# Patient Record
Sex: Male | Born: 1960
Health system: Southern US, Community
[De-identification: ages and names within clinical notes are randomized; demographics above are authoritative.]

## PROBLEM LIST (undated history)

## (undated) DIAGNOSIS — G8929 Other chronic pain: Secondary | ICD-10-CM

## (undated) DIAGNOSIS — I639 Cerebral infarction, unspecified: Secondary | ICD-10-CM

## (undated) DIAGNOSIS — M549 Dorsalgia, unspecified: Secondary | ICD-10-CM

## (undated) DIAGNOSIS — I10 Essential (primary) hypertension: Secondary | ICD-10-CM

## (undated) DIAGNOSIS — C801 Malignant (primary) neoplasm, unspecified: Secondary | ICD-10-CM

## (undated) HISTORY — PX: BACK SURGERY: SHX140

## (undated) HISTORY — PX: KNEE SURGERY: SHX244

---

## 2010-12-28 ENCOUNTER — Emergency Department (HOSPITAL_BASED_OUTPATIENT_CLINIC_OR_DEPARTMENT_OTHER)
Admission: EM | Admit: 2010-12-28 | Discharge: 2010-12-28 | Payer: Self-pay | Source: Home / Self Care | Admitting: Emergency Medicine

## 2011-04-21 ENCOUNTER — Emergency Department (INDEPENDENT_AMBULATORY_CARE_PROVIDER_SITE_OTHER): Payer: PRIVATE HEALTH INSURANCE

## 2011-04-21 ENCOUNTER — Inpatient Hospital Stay (HOSPITAL_COMMUNITY)
Admission: AD | Admit: 2011-04-21 | Discharge: 2011-04-23 | DRG: 921 | Disposition: A | Discharge status: Home / Self Care | Payer: PRIVATE HEALTH INSURANCE | Source: Other Acute Inpatient Hospital | Attending: Internal Medicine | Admitting: Internal Medicine

## 2011-04-21 ENCOUNTER — Emergency Department (HOSPITAL_BASED_OUTPATIENT_CLINIC_OR_DEPARTMENT_OTHER)
Admission: EM | Admit: 2011-04-21 | Discharge: 2011-04-21 | Disposition: A | Discharge status: Other Acute Inpatient Hospital | Payer: PRIVATE HEALTH INSURANCE | Attending: Emergency Medicine | Admitting: Emergency Medicine

## 2011-04-21 DIAGNOSIS — M5137 Other intervertebral disc degeneration, lumbosacral region: Secondary | ICD-10-CM | POA: Insufficient documentation

## 2011-04-21 DIAGNOSIS — M542 Cervicalgia: Secondary | ICD-10-CM

## 2011-04-21 DIAGNOSIS — R079 Chest pain, unspecified: Secondary | ICD-10-CM

## 2011-04-21 DIAGNOSIS — M545 Low back pain, unspecified: Secondary | ICD-10-CM | POA: Diagnosis present

## 2011-04-21 DIAGNOSIS — T8182XA Emphysema (subcutaneous) resulting from a procedure, initial encounter: Principal | ICD-10-CM | POA: Diagnosis present

## 2011-04-21 DIAGNOSIS — G8918 Other acute postprocedural pain: Secondary | ICD-10-CM | POA: Insufficient documentation

## 2011-04-21 DIAGNOSIS — A4902 Methicillin resistant Staphylococcus aureus infection, unspecified site: Secondary | ICD-10-CM | POA: Diagnosis present

## 2011-04-21 DIAGNOSIS — G8929 Other chronic pain: Secondary | ICD-10-CM | POA: Insufficient documentation

## 2011-04-21 DIAGNOSIS — M51379 Other intervertebral disc degeneration, lumbosacral region without mention of lumbar back pain or lower extremity pain: Secondary | ICD-10-CM | POA: Insufficient documentation

## 2011-04-21 DIAGNOSIS — I10 Essential (primary) hypertension: Secondary | ICD-10-CM | POA: Diagnosis present

## 2011-04-21 DIAGNOSIS — Z9889 Other specified postprocedural states: Secondary | ICD-10-CM

## 2011-04-21 DIAGNOSIS — F17200 Nicotine dependence, unspecified, uncomplicated: Secondary | ICD-10-CM | POA: Diagnosis present

## 2011-04-21 DIAGNOSIS — Y831 Surgical operation with implant of artificial internal device as the cause of abnormal reaction of the patient, or of later complication, without mention of misadventure at the time of the procedure: Secondary | ICD-10-CM | POA: Diagnosis present

## 2011-04-21 LAB — DIFFERENTIAL
Basophils Absolute: 0 10*3/uL (ref 0.0–0.1)
Basophils Relative: 0 % (ref 0–1)
Eosinophils Absolute: 0.1 10*3/uL (ref 0.0–0.7)
Eosinophils Relative: 2 % (ref 0–5)
Lymphs Abs: 2.1 10*3/uL (ref 0.7–4.0)

## 2011-04-21 LAB — BASIC METABOLIC PANEL
BUN: 10 mg/dL (ref 6–23)
GFR calc non Af Amer: >60 mL/min (ref >60)
Potassium: 3.7 mEq/L (ref 3.5–5.1)

## 2011-04-21 LAB — BLOOD GAS, ARTERIAL
Acid-Base Excess: 0.7 mmol/L (ref 0.0–2.0)
Drawn by: 29925
O2 Content: 2 L/min
O2 Saturation: 95.9 %
pCO2 arterial: 45.7 mmHg — ABNORMAL HIGH (ref 35.0–45.0)

## 2011-04-21 LAB — CBC
MCV: 87.4 fL (ref 78.0–100.0)
Platelets: 196 10*3/uL (ref 150–400)
RDW: 13.9 % (ref 11.5–15.5)
WBC: 6.7 10*3/uL (ref 4.0–10.5)

## 2011-04-21 MED ORDER — IOHEXOL 350 MG/ML SOLN
100.0000 mL | Freq: Once | INTRAVENOUS | Status: AC | PRN
  Administered 2011-04-21: 100 mL via INTRAVENOUS

## 2011-04-22 ENCOUNTER — Inpatient Hospital Stay (HOSPITAL_COMMUNITY): Payer: PRIVATE HEALTH INSURANCE

## 2011-04-22 DIAGNOSIS — J982 Interstitial emphysema: Secondary | ICD-10-CM

## 2011-04-22 DIAGNOSIS — R0789 Other chest pain: Secondary | ICD-10-CM

## 2011-04-22 LAB — MAGNESIUM: Magnesium: 1.9 mg/dL (ref 1.5–2.5)

## 2011-04-22 LAB — COMPREHENSIVE METABOLIC PANEL
CO2: 27 mEq/L (ref 19–32)
Calcium: 8.8 mg/dL (ref 8.4–10.5)
Creatinine, Ser: 0.88 mg/dL (ref 0.4–1.5)
GFR calc non Af Amer: >60 mL/min (ref >60)
Glucose, Bld: 95 mg/dL (ref 70–99)

## 2011-04-22 LAB — CBC
HCT: 42.2 % (ref 39.0–52.0)
MCH: 30.9 pg (ref 26.0–34.0)
MCHC: 34.1 g/dL (ref 30.0–36.0)
MCV: 90.6 fL (ref 78.0–100.0)
RDW: 14 % (ref 11.5–15.5)

## 2011-04-22 LAB — GLUCOSE, CAPILLARY: Glucose-Capillary: 108 mg/dL — ABNORMAL HIGH (ref 70–99)

## 2011-04-22 LAB — MRSA PCR SCREENING: MRSA by PCR: POSITIVE — AB

## 2011-04-22 NOTE — H&P (Signed)
NAME:  Ronnie Moore, Ronnie Moore NO.:  [id number]  MEDICAL RECORD NO.:  [id number]           PATIENT TYPE:  I  LOCATION:  3111                         FACILITY:  [facility]  PHYSICIAN:  Eduard Clos, MDDATE OF BIRTH:  20-Dec-1960  DATE OF ADMISSION:  04/21/2011 DATE OF DISCHARGE:                             HISTORY & PHYSICAL   PRIMARY CARE PHYSICIAN:  Jackie Plum, MD.  PRIMARY PAIN CLINIC:  [facility] in [place].  CHIEF COMPLAINT:  Neck pain.  HISTORY OF PRESENT ILLNESS:  A 50 year old male with known history of chronic low back pain on multiple pain relief medication and history of hypertension who has been having chronic low back pain for which he has had at least 3 procedures and had a procedure done today morning at the [facility] in [place].  Neurostimulator placed, after which the patient started developing pain worsening and increasing, and the pain eventually went to his neck wherein he was referred to the ER.  The patient had gone to the ER at [facility].  In the ER there, the patient had a CT angio chest which showed air in the mediastinum and epidural space in some of the veins in the region.  CT chest confirmed the pneumomediastinum along with the extrapleural air within the epidural space extending into the lower neck and soft tissues of the lower neck.  The patient has been moved to [facility] for further management.  Dr. Yetta Barre, neurosurgeon on-call, was contacted by ER physician.  The patient at this time still has pain in his neck and does also have some abdominal muscle contraction in the left side of his neck.  Denies any nausea, vomiting, abdominal pain, dysuria, discharge, diarrhea.  He had some subjective feeling of fever or chills.  He did not lose consciousness.  He has no headache.  He has no visual symptom.  He has some difficulty talking but no difficulty swallowing.  He has no focal deficit.  Denies any chest  pain or shortness of breath.  The patient at this time is being transferred to ICU for further close management.  PAST MEDICAL HISTORY:  Hypertension, chronic low back pain.  PAST SURGICAL HISTORY:  He has had three surgeries for his low back and today he had a nerve stimulator placed.  Medications prior to admission which needs to be confirmed includes: 1. OxyContin 20 mg twice daily. 2. Hydralazine. 3. Zanaflex 4 mg 3 times a daily. 4. Meloxicam 15 mg daily. 5. 325 four times daily. 6. Valium 10 mg at bedtime.  ALLERGIES:  No known drug allergies.  FAMILY HISTORY:  Significant for coronary disease in his dad with an MI.  SOCIAL HISTORY:  The patient smokes cigarettes and drinks alcohol.  He denies any drug abuse.  REVIEW OF SYSTEMS:  As per history of present illness, nothing else significant.  PHYSICAL EXAMINATION:  GENERAL:  The patient examined at bedside, still has some neck pain.  Otherwise, not in acute distress. VITAL SIGNS:  Blood pressure is 146/88, pulse 90 per minute, temperature 97.3, respirations 18, O2 sat 95%. HEENT:  There is  any muscle twitching at this time in the neck area, but the nurses have seen a few minutes ago.  There is no neck rigidity. There is mildly swollen neck.  The patient is able to protrude his tongue.  There is no obvious facial asymmetry.  Tongue is midline.  No discharge from ears, eyes, nose, or mouth. CHEST:  Bilateral air entry present.  No rhonchi.  No crepitation. HEART:  S1 and S2 heard. ABDOMEN:  Soft, nontender.  Bowel sounds heard. CNS:  Alert, awake, oriented to time, place, and person.  He is able to move lower extremities. EXTREMITIES:  Peripheral pulses felt.  No edema.  No acute ischemic changes, cyanosis, or clubbing.  There is a neurostimulator in the back, had some  mild ooze which has stopped.  LABORATORY DATA:  CT chest without contrast shows no pneumothorax, air within the epidural space throughout the third  degree and extending into the lower neck.  Extrapleural air within the posterior mediastinum extending into the soft tissues of the lower left neck.  These findings presently relate to the recent neurostimulator placement.  CT angio of neck shows no acute vascular pathology, mild atherosclerotic disease of the carotid bifurcation region, and proximal internal carotid arteries but no flow-limiting stenosis.  Air in the mediastinum, epidural space, and some of the veins of the region, presumably this is related to the neurostimulator of the spine that was implanted today.  C-spine complete shows mild degenerative changes but normal alignment and no acute bony findings.  Radiograph does not showing abnormal gas collection demonstrated on CT.  This examination does rule out significant pneumothorax and pneumomediastinum, mild basilar atelectasis.  X-ray of the lumbosacral spine shows uncomplicated appearance of the neurostimulator, degenerative disease in the lower lumbar spine.  CBC, WBC is 6.7, hemoglobin 15.6, hematocrit is 44.2, platelets 196.  Basic metabolic panel, sodium 136, potassium 2.7, chloride 101, carbon dioxide 24, glucose 99, BUN 10, creatinine 0.9, calcium 9.5.  ABG shows a pH of 7.36, pCO2 45.7, pO2 83.8, oxygen saturation is 95.9%.  ASSESSMENT: 1. Pneumomediastinum with possible developing subcutaneous emphysema. 2. Air in the epidural space and in the veins of the neck, presumably     this relates to the neurostimulator of the spine that was implanted     today. 3. Chronic low back pain. 4. History of hypertension. 5. Ongoing tobacco abuse.  PLAN: 1. At this time, we are going to admit the patient to intensive care     unit. 2. At this time for his pneumomediastinum and possible subcutaneous     emphysema of the left side of the neck and air within the epidural     space, we are going to keep the patient n.p.o.  I am going to place     the patient on vancomycin and  Primaxin.  I am going to discuss with     neurosurgeon on-call and CT surgeon on-call for further     recommendation and I already discussed with pulmonary critical     care. 3. We will repeat chest x-ray in a.m. along with chest x-ray of neck. 4. For his pain relief, we will place the patient on Dilaudid 1-2 mg     IV hourly p.r.n. 5. We will place the patient on tetanus toxoid  intramuscular     injection as the patient does not recall the last dose of his     tetanus. 6. Further recommendation based on the clinic course and consult  recommendations.     Eduard Clos, MD     ANK/MEDQ  D:  04/21/2011  T:  04/21/2011  Job:  [id number]  cc:   Jackie Plum, M.D. [facility].  Electronically Signed by Midge Minium MD on 04/22/2011 07:20:56 AM

## 2011-04-23 ENCOUNTER — Inpatient Hospital Stay (HOSPITAL_COMMUNITY): Payer: PRIVATE HEALTH INSURANCE

## 2011-04-23 LAB — BASIC METABOLIC PANEL
Chloride: 105 mEq/L (ref 96–112)
GFR calc Af Amer: >60 mL/min (ref >60)
GFR calc non Af Amer: >60 mL/min (ref >60)
Potassium: 3.9 mEq/L (ref 3.5–5.1)
Sodium: 137 mEq/L (ref 135–145)

## 2011-04-23 LAB — CBC
MCV: 91.1 fL (ref 78.0–100.0)
Platelets: 183 10*3/uL (ref 150–400)
RBC: 4.49 MIL/uL (ref 4.22–5.81)
RDW: 13.8 % (ref 11.5–15.5)
WBC: 6.7 10*3/uL (ref 4.0–10.5)

## 2011-04-27 ENCOUNTER — Emergency Department (HOSPITAL_BASED_OUTPATIENT_CLINIC_OR_DEPARTMENT_OTHER)
Admission: EM | Admit: 2011-04-27 | Discharge: 2011-04-27 | Disposition: A | Discharge status: Home / Self Care | Payer: PRIVATE HEALTH INSURANCE | Attending: Emergency Medicine | Admitting: Emergency Medicine

## 2011-04-27 DIAGNOSIS — F17200 Nicotine dependence, unspecified, uncomplicated: Secondary | ICD-10-CM | POA: Insufficient documentation

## 2011-04-27 DIAGNOSIS — M542 Cervicalgia: Secondary | ICD-10-CM | POA: Insufficient documentation

## 2011-04-27 DIAGNOSIS — I10 Essential (primary) hypertension: Secondary | ICD-10-CM | POA: Insufficient documentation

## 2011-04-27 DIAGNOSIS — G8929 Other chronic pain: Secondary | ICD-10-CM | POA: Insufficient documentation

## 2011-05-05 ENCOUNTER — Emergency Department (HOSPITAL_BASED_OUTPATIENT_CLINIC_OR_DEPARTMENT_OTHER)
Admission: EM | Admit: 2011-05-05 | Discharge: 2011-05-05 | Disposition: A | Discharge status: Home / Self Care | Payer: PRIVATE HEALTH INSURANCE | Attending: Emergency Medicine | Admitting: Emergency Medicine

## 2011-05-05 DIAGNOSIS — M549 Dorsalgia, unspecified: Secondary | ICD-10-CM | POA: Insufficient documentation

## 2011-05-05 DIAGNOSIS — F17200 Nicotine dependence, unspecified, uncomplicated: Secondary | ICD-10-CM | POA: Insufficient documentation

## 2011-05-05 DIAGNOSIS — F3289 Other specified depressive episodes: Secondary | ICD-10-CM | POA: Insufficient documentation

## 2011-05-05 DIAGNOSIS — Z79899 Other long term (current) drug therapy: Secondary | ICD-10-CM | POA: Insufficient documentation

## 2011-05-05 DIAGNOSIS — I10 Essential (primary) hypertension: Secondary | ICD-10-CM | POA: Insufficient documentation

## 2011-05-05 DIAGNOSIS — G8929 Other chronic pain: Secondary | ICD-10-CM | POA: Insufficient documentation

## 2011-05-05 DIAGNOSIS — F329 Major depressive disorder, single episode, unspecified: Secondary | ICD-10-CM | POA: Insufficient documentation

## 2011-05-18 NOTE — Discharge Summary (Signed)
NAME:  Ronnie Moore, Ronnie Moore NO.:  [id number]  MEDICAL RECORD NO.:  [id number]           PATIENT TYPE:  I  LOCATION:  3111                         FACILITY:  [facility]  PHYSICIAN:  Lonia Blood, M.D.DATE OF BIRTH:  10/08/1961  DATE OF ADMISSION:  04/21/2011 DATE OF DISCHARGE:  04/23/2011                        DISCHARGE SUMMARY - REFERRING   ADMITTING PHYSICIAN:  Eduard Clos, MD, [facility].  DISCHARGING PHYSICIAN:  Lonia Blood, MD  PRIMARY CARE PHYSICIAN:  Jackie Plum, MD  PAIN CLINIC:  [facility] in [place], telephone number [phone].  CONSULTANTS THIS ADMISSION: 1. Tia Alert, MD, with Neurosurgery. 2. Nelda Bucks, MD, with Pulmonary Critical Care Medicine.  CHIEF COMPLAINT/REASON FOR ADMISSION:  [name] is a 50 year old male who has been having issues with chronic neck and back pain for many many years.  He has had multiple procedures done and on the date of admission had had a neurostimulator placed via the [facility] which is a pain clinic in [place].  Post stimulator placement, the patient developed worsening and increasing pain where he was eventually referred to [facility] ER for further evaluation.  He had gone to the [facility] ER. CT angio of the chest done in that facility demonstrated a pneumomediastinum as well as air in the epidural space.  His CT of the chest confirmed pneumomediastinum including the additional finding of extrapleural air within the epidural space extending into the lower back and soft tissues of the lower neck.  Some of this air was presumed to be related to expected postop findings from neurostimulator placement, but the pneumomediastinum was an unexpected finding; therefore, the patient was transferred to [facility] for further evaluation.  Upon arrival to [facility], the patient endorses continuing having neck pain but was not having any acute respiratory distress.   His BP was 146/88, pulse 90, respirations 18, O2 sat 95%.  He was afebrile.  The patient had no pulmonary or neurological deficits on initial exam.  He was found to have some musculoskeletal spasmodic type activity without rigidity and the neck was mildly swollen as expected at the operative site. Because he had a recent neurostimulator placed, neurosurgeon, Dr. Yetta Barre, was consulted.  Because of the pneumomediastinum, Dr. Tyson Alias with Pulmonary Critical Care Medicine was consulted.  His initial laboratory values showed a white count of 6700, hemoglobin 15.6, hematocrit of 44.2, platelets 196,000.  Sodium 136, potassium 2.7, chloride 101, glucose 99, BUN 10, creatinine 0.9.  ABG, pH 7.36, pCO2 of 45.7, pO2 of 83.8, O2 sat 95.9%.  CT angiography of the neck shows no acute vascular pathology but air in the mediastinum, epidural space, and some of the veins in at the region and this was again presumably related to the neurostimulator implantation from same date. 1. Hypertension. 2. Chronic low back pain, followed by the [facility].  ADMITTING DIAGNOSES: 1. Pneumomediastinum without hypoxemia or respiratory distress. 2. Postoperative air in the epidural space and veins of the neck,     likely expected finding after neurostimulator implant. 3. Chronic low back pain as described. 4. Hypertension, moderate controlled. 5. Ongoing  tobacco abuse.  DIAGNOSTICS: 1. Complete four-view x-ray of the cervical spine on Apr 21, 2011,     shows mild degenerative changes but normal alignment and no acute     bony findings. 2. CT angio of the neck on Apr 21, 2011, that again shows no acute     vascular pathology and air in the mediastinum and epidural space as     previously described. 3. Two-view chest x-ray on Apr 21, 2011, shows no abnormal gas     collection as was demonstrated on the CT.  This examination does     rule out significant pneumothorax and pneumomediastinum.  There was      found to be mild basilar atelectasis. 4. Lumbar spine, two - three-view on Apr 21, 2011, shows uncomplicated     appearance of neurostimulator.  Degenerative disease in lower     lumbar spine. 5. CT of the chest without contrast on Apr 21, 2011, this shows no     pneumothorax and air within the epidural space throughout the     thoracic region extending into the lower neck.  Extrapleural air     within the posterior mediastinum extending into the soft tissues of     the lower neck.  These findings are presumedly related to the     recent neurostimulator placement. 6. Two-view chest x-ray on Apr 22, 2011, shows low lung volumes, no     acute findings. 7. Plain x-ray of the soft tissues of the neck on Apr 22, 2011, shows     supraglottic airway appears to be intact.  Minimal spondylosis at     C5-C6. 8. Portable chest x-ray on Apr 22, 2011, shows no acute findings.  No     evidence of pneumomediastinum and pneumothorax. 9. Portable chest x-ray on Apr 23, 2011, shows that the diagnostic     quality is decreased by body habitus and the patient's motion.  No     definite pneumothorax.  Developing right basilar airspace disease.  LABORATORY DATA:  MRSA PCR screening was positive.  Followup labs on Apr 23, 2011, date of discharge, white count 6700, hemoglobin 14, hematocrit 41, platelets 183,000.  Sodium 137, potassium 3.9, chloride 105, CO2 is 25, glucose 111, BUN 14, creatinine 1.03.  TSH was checked this admission 1.345, magnesium was 1.9.  HOSPITAL COURSE: 1. Pneumomediastinum post neurostimulator implant.  The patient had     two-fold problem in regard to recurring pain at the neck and     asymptomatic pneumomediastinum at presentation.  Neurosurgery, Dr.     Marikay Alar evaluated the patient.  Dr. Yetta Barre felt that the neck     pain he was experiencing was possibly related to the epidural air.     There were no indications to proceed with any surgical     intervention.  He felt that  the air will simply dissipate in time.     He endorses that the patient's neurological exam is at baseline and     he recommends to simply treat him expectantly with pain management     and hopefully, his pain will ease or dissipate.  Because of the     pneumomediastinum, Dr. Tyson Alias with Pulmonary Critical Care     Medicine was consulted.  Recommendations were to follow clinically     and to follow with serial chest x-rays.  On date of discharge,     there was no progression of air in the mediastinum or chest.  The     patient was not in any distress.  He was maintaining appropriate O2     saturations of 93% on room air and was deemed appropriate for     discharge home.  Pulmonary Critical Care Medicine actually signed     off. 2. Chronic pain.  The patient has had difficulty with pain management     for many years.  He has followed regularly at the [facility]     in [place].  When questioning the patient about his pain     medications, he endorsed an unusual schedule for his pain     medications including taking 20 mg OxyContin q.6 h. as well as     taking Percocet 6-8 tablets per day.  The [facility] was     contacted and a copy of the pain management contract for their     facility was faxed as well as the patient's medications that are     prescribed solely by the pain clinic.  It is noted that the     patient's OxyContin is 20 mg every 12 hours with 60 tablets     dispensed at the last prescription date, Apr 08, 2011.  His Percocet     is also at 10/25 strength to be taken every 8 hours with 90 tablets     dispensed on Apr 08, 2011.  At the present time, I discussed with     Dr. Sharon Seller any recommendations for pain management and there was     some discussion with the patient this morning that he may actually     be out of some of his pain medications.  Therefore, we will give     him five days' worth of all of his pain medication prescriptions.     The patient does  have an appointment scheduled with the [facility] on Apr 27, 2011.  In addition, the patient and     his wife were requesting a physician note to have the patient be     excused from a court date on Monday Apr 26, 2011.  We deferred this     to the patient's pain clinic/surgeon or physician who placed the     neurostimulator device to determine if there were any postoperative     indications for him to not attend any court date scheduled.  I did     discuss this with the Pain Clinic and they see no indications at     this time for the patient to be excused from any pending court     dates or other upcoming appointment.  FINAL DISCHARGE DIAGNOSES: 1. Postop pneumomediastinum after neurostimulator placement, resolved. 2. Chronic neck pain with acute component, related to expected postop     changes from neurostimulator implant. 3. Hypertension, currently controlled.  DISCHARGE MEDICATIONS: 1. Hydralazine 10 mg four times daily. 2. Meloxicam 15 mg one tablet by mouth daily as needed for pain. 3. OxyContin 20 mg b.i.d. as needed for pain. 4. Percocet 10/325 one tablet every 8 hours as needed for pain. 5. Valium 10 mg one tablet daily at bedtime. 6. Zanaflex 4 mg three times daily as needed for pain.  OTHER DISCHARGE INSTRUCTIONS:  Activity:  Increase activity slowly, otherwise, as recommended by previous physician, i.e., MD that placed neurostimulator device. Diet:  No restrictions. Followup Appointments: 1. Return to the [facility], telephone number [phone], on Apr 27, 2011, as scheduled. 2. Follow up with any other physicians that placed the nerve     stimulator device and follow up with Dr. Julio Sicks as previously     scheduled.     Allison L. Rennis Harding, N.P.   ______________________________ Lonia Blood, M.D.    ALE/MEDQ  D:  04/23/2011  T:  04/23/2011  Job:  [id number]  cc:   [facility] Jackie Plum, M.D.  Electronically Signed  by Junious Silk N.P. on 04/28/2011 08:15:44 AM Electronically Signed by Jetty Duhamel M.D. on 05/17/2011 09:50:49 PM

## 2011-05-18 NOTE — Consult Note (Signed)
  NAME:  Ronnie Moore, Ronnie Moore NO.:  [id number]  MEDICAL RECORD NO.:  [id number]           PATIENT TYPE:  I  LOCATION:  3111                         FACILITY:  [facility]  PHYSICIAN:  Tia Alert, MD     DATE OF BIRTH:  Nov 15, 1961  DATE OF CONSULTATION:  04/22/2011 DATE OF DISCHARGE:                                CONSULTATION   CHIEF COMPLAINT:  Neck pain.  HISTORY OF PRESENT ILLNESS:  Mr. Bautch is a 50 year old gentleman who is status post lumbar laminectomy by Dr. Corinne Ports in [facility] who has been in pain management with Dr. Dalene Carrow.  He was getting a thoracic spinal cord stimulator placement yesterday and developed left-sided neck pain.  The pain was severe.  During the procedure, he was sent to [facility] ER where CT scan showed epidural air in the cervical and thoracic region but no complication around the spinal cord stimulator.  The patient was admitted to the ICU and the hospitalist asked me to see him in consultation.  The patient denies any new arm pain or numbness, tingling, or weakness in the arms or legs.  He continues to complain of left-sided neck pain.  He is receiving significant amounts of pain medication.  PHYSICAL EXAMINATION:  He is awake and alert.  He is interactive.  No aphasia.  Good attention span.  He moves all extremities with good power and strength.  He has hypoactive reflexes.  Negative Hoffman sign.  Gait is not tested at this point.  ASSESSMENT AND PLAN:  This is a 50 year old gentleman with epidural air after a thoracic spinal cord stimulator placement and some air was injected into the epidural space.  At this time, this procedure is done in order to localize the epidural space with placement of the spinal cord stimulator and therefore I am not sure that the patient still have epidural air after this procedure each and every time but we easily get these CT scans after this, so it is difficult to know.  He does  have neck pain and it maybe related to the epidural air.  There is no surgery to be done and that will simply dissipate in time.  His neurological exam is at its baseline.  I would simply treat him expectantly with pain management and hopefully pain will ease or dissipates.     Tia Alert, MD     DSJ/MEDQ  D:  04/22/2011  T:  04/23/2011  Job:  [id number]  Electronically Signed by Marikay Alar MD on 05/18/2011 09:46:08 AM

## 2012-08-30 ENCOUNTER — Emergency Department (HOSPITAL_BASED_OUTPATIENT_CLINIC_OR_DEPARTMENT_OTHER)
Admission: EM | Admit: 2012-08-30 | Discharge: 2012-08-30 | Disposition: A | Discharge status: Home / Self Care | Payer: PRIVATE HEALTH INSURANCE | Attending: Emergency Medicine | Admitting: Emergency Medicine

## 2012-08-30 ENCOUNTER — Encounter (HOSPITAL_BASED_OUTPATIENT_CLINIC_OR_DEPARTMENT_OTHER): Payer: Self-pay | Admitting: Emergency Medicine

## 2012-08-30 DIAGNOSIS — M545 Low back pain, unspecified: Secondary | ICD-10-CM | POA: Insufficient documentation

## 2012-08-30 DIAGNOSIS — G8929 Other chronic pain: Secondary | ICD-10-CM

## 2012-08-30 DIAGNOSIS — Y92009 Unspecified place in unspecified non-institutional (private) residence as the place of occurrence of the external cause: Secondary | ICD-10-CM | POA: Insufficient documentation

## 2012-08-30 DIAGNOSIS — X500XXA Overexertion from strenuous movement or load, initial encounter: Secondary | ICD-10-CM | POA: Insufficient documentation

## 2012-08-30 DIAGNOSIS — W19XXXA Unspecified fall, initial encounter: Secondary | ICD-10-CM

## 2012-08-30 HISTORY — DX: Other chronic pain: G89.29

## 2012-08-30 HISTORY — DX: Dorsalgia, unspecified: M54.9

## 2012-08-30 MED ORDER — LORAZEPAM 2 MG PO TABS: 2.0000 mg | ORAL_TABLET | Freq: Three times a day (TID) | ORAL | Status: DC

## 2012-08-30 MED ORDER — MORPHINE SULFATE ER BEADS 30 MG PO CP24: 30.0000 mg | ORAL_CAPSULE | Freq: Every day | ORAL | Status: DC

## 2012-08-30 MED ORDER — HYDROMORPHONE HCL PF 2 MG/ML IJ SOLN
2.0000 mg | Freq: Once | INTRAMUSCULAR | Status: AC
  Administered 2012-08-30: 2 mg via INTRAMUSCULAR
  Filled 2012-08-30: qty 1

## 2012-08-30 MED ORDER — MELOXICAM 15 MG PO TABS: 15.0000 mg | ORAL_TABLET | Freq: Every day | ORAL | Status: AC

## 2012-08-30 MED ORDER — OXYCODONE HCL 15 MG PO TABS: 15.0000 mg | ORAL_TABLET | ORAL | Status: DC | PRN

## 2012-08-30 NOTE — ED Provider Notes (Signed)
History     CSN: [id number]  Arrival date & time 08/30/12  4098   First MD Initiated Contact with Patient 08/30/12 [id number]      Chief Complaint  Patient presents with  . Fall    HPI  Pt states he fell while getting out of the shower. Denies head injury or LOC. Pt states he has pain lower back pain, radiating down legs and buttocks. Pt denies elimination problems.     Patient states he did not actually hit the ground.  He just twisted and caught himself on the way down.  He has a history of chronic pain and just saw he exacerbated his pain.  He has no focal neurological complaints.  Past Medical History  Diagnosis Date  . Chronic back pain     Past Surgical History  Procedure Date  . Back surgery     No family history on file.  History  Substance Use Topics  . Smoking status: Not on file  . Smokeless tobacco: Not on file  . Alcohol Use:       Review of Systems  All other systems reviewed and are negative.    Allergies  Review of patient's allergies indicates no known allergies.  Home Medications   Current Outpatient Rx  Name Route Sig Dispense Refill  . LORAZEPAM 2 MG PO TABS Oral Take 1 tablet (2 mg total) by mouth 3 (three) times daily. 12 tablet 0  . MELOXICAM 15 MG PO TABS Oral Take 1 tablet (15 mg total) by mouth daily. 6 tablet 0  . MORPHINE SULFATE ER BEADS 30 MG PO CP24 Oral Take 1 capsule (30 mg total) by mouth daily. 6 capsule 0  . OXYCODONE HCL 15 MG PO TABS Oral Take 1 tablet (15 mg total) by mouth every 4 (four) hours as needed. 15 tablet 0    BP 151/81  Pulse 93  Temp 98.2 F (36.8 C) (Oral)  Resp 16  SpO2 97%  Physical Exam  Nursing note and vitals reviewed. Constitutional: He is oriented to person, place, and time. He appears well-developed. No distress.  HENT:  Head: Normocephalic and atraumatic.  Eyes: Pupils are equal, round, and reactive to light.  Neck: Normal range of motion.  Cardiovascular: Normal rate and intact distal pulses.    Pulmonary/Chest: No respiratory distress.  Abdominal: Normal appearance. He exhibits no distension.  Musculoskeletal:       Thoracic back: He exhibits tenderness, pain and spasm.       Lumbar back: He exhibits tenderness, pain and spasm.       Pain primarily is related to muscle spasm and not elicited with bony palpation.  Neurological: He is alert and oriented to person, place, and time. No cranial nerve deficit.  Skin: Skin is warm and dry. No rash noted.  Psychiatric: He has a normal mood and affect. His behavior is normal.    ED Course  Procedures (including critical care time) Scheduled Meds:   . HYDROmorphone  2 mg Intramuscular Once   Continuous Infusions:  PRN Meds:.  Labs Reviewed - No data to display No results found.   1. Fall   2. Chronic pain       MDM          Nelia Shi, MD 08/30/12 [phone]

## 2012-08-30 NOTE — ED Notes (Signed)
Pt states he fell while getting out of the shower.  Denies head injury or LOC.  Pt states he has pain lower back pain, radiating down legs and buttocks.  Pt denies elimination problems.

## 2012-12-26 ENCOUNTER — Emergency Department (HOSPITAL_BASED_OUTPATIENT_CLINIC_OR_DEPARTMENT_OTHER)
Admission: EM | Admit: 2012-12-26 | Discharge: 2012-12-26 | Disposition: A | Discharge status: Home / Self Care | Payer: PRIVATE HEALTH INSURANCE | Attending: Emergency Medicine | Admitting: Emergency Medicine

## 2012-12-26 ENCOUNTER — Encounter (HOSPITAL_BASED_OUTPATIENT_CLINIC_OR_DEPARTMENT_OTHER): Payer: Self-pay | Admitting: *Deleted

## 2012-12-26 DIAGNOSIS — Z79899 Other long term (current) drug therapy: Secondary | ICD-10-CM | POA: Insufficient documentation

## 2012-12-26 DIAGNOSIS — Y929 Unspecified place or not applicable: Secondary | ICD-10-CM | POA: Insufficient documentation

## 2012-12-26 DIAGNOSIS — IMO0002 Reserved for concepts with insufficient information to code with codable children: Secondary | ICD-10-CM | POA: Insufficient documentation

## 2012-12-26 DIAGNOSIS — F17200 Nicotine dependence, unspecified, uncomplicated: Secondary | ICD-10-CM | POA: Insufficient documentation

## 2012-12-26 DIAGNOSIS — Z8739 Personal history of other diseases of the musculoskeletal system and connective tissue: Secondary | ICD-10-CM | POA: Insufficient documentation

## 2012-12-26 DIAGNOSIS — Y939 Activity, unspecified: Secondary | ICD-10-CM | POA: Insufficient documentation

## 2012-12-26 DIAGNOSIS — T169XXA Foreign body in ear, unspecified ear, initial encounter: Secondary | ICD-10-CM | POA: Insufficient documentation

## 2012-12-26 MED ORDER — OXYCODONE-ACETAMINOPHEN 5-325 MG PO TABS
2.0000 | ORAL_TABLET | Freq: Once | ORAL | Status: AC
  Administered 2012-12-26: 2 via ORAL
  Filled 2012-12-26 (×2): qty 2

## 2012-12-26 NOTE — ED Notes (Signed)
MD at bedside. 

## 2012-12-26 NOTE — ED Notes (Signed)
Pt states that due to the contract he has with pain management clinic must have a shot for pain if he gets meds. Will notify MD

## 2012-12-26 NOTE — ED Notes (Signed)
Spoke with MD states that is ok for him to take medication here for the pain

## 2012-12-26 NOTE — ED Notes (Signed)
D/c home with ride- no new rx given 

## 2012-12-26 NOTE — ED Provider Notes (Signed)
History     CSN: [id number]  Arrival date & time 12/26/12  4098   First MD Initiated Contact with Patient 12/26/12 1013      Chief Complaint  Patient presents with  . Otalgia    (Consider location/radiation/quality/duration/timing/severity/associated sxs/prior treatment) HPI Comments: Patient presents with right ear pain after the part of the year but it got stuck in his ear last night. He's had constant worsening pain in his right ear since that incident. He denies any drainage from his ear. He denies any other complaints.  Patient is a 52 y.o. male presenting with ear pain.  Otalgia Pertinent negatives include no headaches, no rhinorrhea and no vomiting.    Past Medical History  Diagnosis Date  . Chronic back pain     Past Surgical History  Procedure Date  . Back surgery     History reviewed. No pertinent family history.  History  Substance Use Topics  . Smoking status: Current Some Day Smoker  . Smokeless tobacco: Not on file  . Alcohol Use: Yes     Comment: occ      Review of Systems  Constitutional: Negative for fever.  HENT: Positive for ear pain. Negative for congestion and rhinorrhea.   Gastrointestinal: Negative for nausea and vomiting.  Neurological: Negative for headaches.    Allergies  Review of patient's allergies indicates no known allergies.  Home Medications   Current Outpatient Rx  Name  Route  Sig  Dispense  Refill  . LORAZEPAM 2 MG PO TABS   Oral   Take 1 tablet (2 mg total) by mouth 3 (three) times daily.   12 tablet   0   . MELOXICAM 15 MG PO TABS   Oral   Take 1 tablet (15 mg total) by mouth daily.   6 tablet   0   . MORPHINE SULFATE ER BEADS 30 MG PO CP24   Oral   Take 1 capsule (30 mg total) by mouth daily.   6 capsule   0   . OXYCODONE HCL 15 MG PO TABS   Oral   Take 1 tablet (15 mg total) by mouth every 4 (four) hours as needed.   15 tablet   0     BP 176/93  Pulse 83  Temp 97.8 F (36.6 C) (Oral)  Resp  20  Ht 5\' 8"  (1.727 m)  Wt 300 lb (136.079 kg)  BMI 45.61 kg/m2  SpO2 97%  Physical Exam  Constitutional: He is oriented to person, place, and time. He appears well-developed and well-nourished.  HENT:       Patient has a black object in the external right ear canal  Eyes: Pupils are equal, round, and reactive to light.  Cardiovascular: Normal rate.   Pulmonary/Chest: Effort normal.  Neurological: He is alert and oriented to person, place, and time.  Skin: Skin is warm and dry.    ED Course  Procedures (including critical care time)  Labs Reviewed - No data to display No results found.   1. Ear foreign body       MDM  Using alligator forceps the ear but it was removed on one attempt without complications. The ear was reexamined and there is no erythema, bleeding or drainage from the ear. The TM appears intact. Head eyes and return if his symptoms worsen. I also advised him need a recheck on his blood pressure by his primary care physician        Rolan Bucco, MD 12/26/12 1103

## 2012-12-26 NOTE — ED Notes (Signed)
Ear bud in right ear

## 2013-08-18 ENCOUNTER — Encounter (HOSPITAL_BASED_OUTPATIENT_CLINIC_OR_DEPARTMENT_OTHER): Payer: Self-pay | Admitting: Emergency Medicine

## 2013-08-18 ENCOUNTER — Emergency Department (HOSPITAL_BASED_OUTPATIENT_CLINIC_OR_DEPARTMENT_OTHER)
Admission: EM | Admit: 2013-08-18 | Discharge: 2013-08-18 | Disposition: A | Discharge status: Home / Self Care | Payer: PRIVATE HEALTH INSURANCE | Attending: Emergency Medicine | Admitting: Emergency Medicine

## 2013-08-18 DIAGNOSIS — G8929 Other chronic pain: Secondary | ICD-10-CM | POA: Insufficient documentation

## 2013-08-18 DIAGNOSIS — Z79899 Other long term (current) drug therapy: Secondary | ICD-10-CM | POA: Insufficient documentation

## 2013-08-18 DIAGNOSIS — M549 Dorsalgia, unspecified: Secondary | ICD-10-CM | POA: Insufficient documentation

## 2013-08-18 DIAGNOSIS — F17200 Nicotine dependence, unspecified, uncomplicated: Secondary | ICD-10-CM | POA: Insufficient documentation

## 2013-08-18 MED ORDER — ONDANSETRON HCL 4 MG/2ML IJ SOLN
4.0000 mg | Freq: Once | INTRAMUSCULAR | Status: AC
  Administered 2013-08-18: 4 mg via INTRAMUSCULAR
  Filled 2013-08-18: qty 2

## 2013-08-18 MED ORDER — HYDROMORPHONE HCL PF 2 MG/ML IJ SOLN
2.0000 mg | Freq: Once | INTRAMUSCULAR | Status: AC
  Administered 2013-08-18: 2 mg via INTRAMUSCULAR
  Filled 2013-08-18: qty 1

## 2013-08-18 MED ORDER — KETOROLAC TROMETHAMINE 60 MG/2ML IM SOLN
60.0000 mg | Freq: Once | INTRAMUSCULAR | Status: AC
  Administered 2013-08-18: 60 mg via INTRAMUSCULAR
  Filled 2013-08-18: qty 2

## 2013-08-18 NOTE — ED Notes (Signed)
Pt reports fall three days ago.  Struck left side of his body.  Patient reports LOC x 10-15 minutes.  Reports today d/t continuing pain.  States is currently under a pain clinic management-unable to receive any written prescriptions for pain.

## 2013-08-18 NOTE — ED Provider Notes (Signed)
  Medical screening examination/treatment/procedure(s) were performed by non-physician practitioner and as supervising physician I was immediately available for consultation/collaboration.   Yesli Vanderhoff, MD 08/18/13 1538 

## 2013-08-18 NOTE — ED Provider Notes (Signed)
CSN: [id number]     Arrival date & time 08/18/13  1143 History   First MD Initiated Contact with Patient 08/18/13 1221     Chief Complaint  Patient presents with  . Back Pain   (Consider location/radiation/quality/duration/timing/severity/associated sxs/prior Treatment) Patient is a 52 y.o. male presenting with back pain. The history is provided by the patient. No language interpreter was used.  Back Pain Location:  Generalized Quality:  Aching Pain severity:  Severe Onset quality:  Gradual Progression:  Worsening Chronicity:  New Relieved by:  Nothing Worsened by:  Nothing tried Pt complains of back pain.   Pt is on oxycodone and morphine for pain management and in a pain management clinic.   Pain not managed today.   Pt requesting injection.   Pt reports he fell 3 days ago and hit left side.  Shoulder and side.    Past Medical History  Diagnosis Date  . Chronic back pain    Past Surgical History  Procedure Laterality Date  . Back surgery     No family history on file. History  Substance Use Topics  . Smoking status: Current Some Day Smoker  . Smokeless tobacco: Not on file  . Alcohol Use: Yes     Comment: occ    Review of Systems  Musculoskeletal: Positive for back pain.  All other systems reviewed and are negative.    Allergies  Review of patient's allergies indicates no known allergies.  Home Medications   Current Outpatient Rx  Name  Route  Sig  Dispense  Refill  . LORazepam (ATIVAN) 2 MG tablet   Oral   Take 1 tablet (2 mg total) by mouth 3 (three) times daily.   12 tablet   0   . meloxicam (MOBIC) 15 MG tablet   Oral   Take 1 tablet (15 mg total) by mouth daily.   6 tablet   0   . morphine (AVINZA) 30 MG 24 hr capsule   Oral   Take 1 capsule (30 mg total) by mouth daily.   6 capsule   0   . oxyCODONE (ROXICODONE) 15 MG immediate release tablet   Oral   Take 1 tablet (15 mg total) by mouth every 4 (four) hours as needed.   15 tablet   0     BP 174/87  Pulse 98  Temp(Src) 98.7 F (37.1 C) (Oral)  Resp 18  Ht 5\' 8"  (1.727 m)  Wt 300 lb (136.079 kg)  BMI 45.63 kg/m2  SpO2 97% Physical Exam  Nursing note and vitals reviewed. Constitutional: He is oriented to person, place, and time. He appears well-developed and well-nourished.  HENT:  Head: Normocephalic.  Right Ear: External ear normal.  Left Ear: External ear normal.  Nose: Nose normal.  Mouth/Throat: Oropharynx is clear and moist.  Eyes: Pupils are equal, round, and reactive to light.  Neck: Normal range of motion.  Cardiovascular: Normal rate.   Pulmonary/Chest: Effort normal.  Abdominal: Soft.  Musculoskeletal: Normal range of motion.  Neurological: He is alert and oriented to person, place, and time. He has normal reflexes.  Skin: Skin is warm.  Psychiatric: He has a normal mood and affect.    ED Course  Procedures (including critical care time) Labs Review Labs Reviewed - No data to display Imaging Review No results found.  MDM   1. Chronic pain    Pt given an injected of torodol dilaudid and zofran.   Pt reports some relief.   Pt advised  to call his pain doctor on Monday   Lonia Skinner [place], [place] 08/18/13 1342

## 2013-08-18 NOTE — ED Notes (Signed)
During assessment, patient is noted to respond positively to all questions when asked about pertinent positives.  C/o being in excessive pain, mostly to entire posterior of body, notes swelling to his lower extremities, and verbalizes attending a pain management clinic.  Visitor present at bedside is drowsy and minimally interactive.

## 2014-08-24 ENCOUNTER — Emergency Department (HOSPITAL_BASED_OUTPATIENT_CLINIC_OR_DEPARTMENT_OTHER)
Admission: EM | Admit: 2014-08-24 | Discharge: 2014-08-24 | Disposition: A | Discharge status: Home / Self Care | Payer: PRIVATE HEALTH INSURANCE | Attending: Emergency Medicine | Admitting: Emergency Medicine

## 2014-08-24 ENCOUNTER — Encounter (HOSPITAL_BASED_OUTPATIENT_CLINIC_OR_DEPARTMENT_OTHER): Payer: Self-pay | Admitting: Emergency Medicine

## 2014-08-24 ENCOUNTER — Emergency Department (HOSPITAL_BASED_OUTPATIENT_CLINIC_OR_DEPARTMENT_OTHER): Payer: PRIVATE HEALTH INSURANCE

## 2014-08-24 DIAGNOSIS — Z79899 Other long term (current) drug therapy: Secondary | ICD-10-CM | POA: Insufficient documentation

## 2014-08-24 DIAGNOSIS — Y9389 Activity, other specified: Secondary | ICD-10-CM | POA: Diagnosis not present

## 2014-08-24 DIAGNOSIS — G8929 Other chronic pain: Secondary | ICD-10-CM | POA: Insufficient documentation

## 2014-08-24 DIAGNOSIS — Y929 Unspecified place or not applicable: Secondary | ICD-10-CM | POA: Diagnosis not present

## 2014-08-24 DIAGNOSIS — F17200 Nicotine dependence, unspecified, uncomplicated: Secondary | ICD-10-CM | POA: Insufficient documentation

## 2014-08-24 DIAGNOSIS — S6992XA Unspecified injury of left wrist, hand and finger(s), initial encounter: Secondary | ICD-10-CM

## 2014-08-24 DIAGNOSIS — S6990XA Unspecified injury of unspecified wrist, hand and finger(s), initial encounter: Secondary | ICD-10-CM | POA: Diagnosis present

## 2014-08-24 DIAGNOSIS — Z791 Long term (current) use of non-steroidal anti-inflammatories (NSAID): Secondary | ICD-10-CM | POA: Insufficient documentation

## 2014-08-24 DIAGNOSIS — R296 Repeated falls: Secondary | ICD-10-CM | POA: Insufficient documentation

## 2014-08-24 MED ORDER — HYDROMORPHONE HCL 1 MG/ML IJ SOLN
2.0000 mg | Freq: Once | INTRAMUSCULAR | Status: AC
  Administered 2014-08-24: 2 mg via INTRAMUSCULAR
  Filled 2014-08-24: qty 2

## 2014-08-24 MED ORDER — IBUPROFEN 800 MG PO TABS: 800.0000 mg | ORAL_TABLET | Freq: Three times a day (TID) | ORAL | Status: DC

## 2014-08-24 NOTE — ED Notes (Signed)
Patient fell on Left hand on Wednesday and hand is swollen and pain has grown worse

## 2014-08-24 NOTE — ED Provider Notes (Addendum)
CSN: [id number]     Arrival date & time 08/24/14  1039 History   First MD Initiated Contact with Patient 08/24/14 1156     Chief Complaint  Patient presents with  . Hand Pain     (Consider location/radiation/quality/duration/timing/severity/associated sxs/prior Treatment) HPI 53 year old male presents with left hand pain since falling off of his lawnmower 3 days ago. He states he caught himself on his left hand. He's had progressive swelling to his hand. The pain is worse when his hand is lowered and he started getting pain in his forearm. He's been trying ice and heat as well as his oxycodone without relief. He describes the pain currently as severe. Denies a weakness or numbness but has trouble moving his fingers due to the swelling.  Past Medical History  Diagnosis Date  . Chronic back pain    Past Surgical History  Procedure Laterality Date  . Back surgery     History reviewed. No pertinent family history. History  Substance Use Topics  . Smoking status: Current Some Day Smoker  . Smokeless tobacco: Not on file  . Alcohol Use: Yes     Comment: occ    Review of Systems  Musculoskeletal: Positive for joint swelling.  Skin: Negative for color change and wound.  Neurological: Negative for weakness and numbness.  All other systems reviewed and are negative.     Allergies  Review of patient's allergies indicates no known allergies.  Home Medications   Prior to Admission medications   Medication Sig Start Date End Date Taking? Authorizing Provider  HYDROCHLOROTHIAZIDE PO Take by mouth.   Yes Historical Provider, MD  LORazepam (ATIVAN) 2 MG tablet Take 1 tablet (2 mg total) by mouth 3 (three) times daily. 08/30/12   Dot Lanes, MD  meloxicam (MOBIC) 15 MG tablet Take 1 tablet (15 mg total) by mouth daily. 08/30/12   Dot Lanes, MD  morphine (AVINZA) 30 MG 24 hr capsule Take 1 capsule (30 mg total) by mouth daily. 08/30/12   Dot Lanes, MD  oxyCODONE  (ROXICODONE) 15 MG immediate release tablet Take 1 tablet (15 mg total) by mouth every 4 (four) hours as needed. 08/30/12   Dot Lanes, MD   BP 150/88  Pulse 89  Temp(Src) 98.4 F (36.9 C) (Oral)  Resp 14  Ht 5\' 9"  (1.753 m)  Wt 275 lb (124.739 kg)  BMI 40.59 kg/m2  SpO2 99% Physical Exam  Nursing note and vitals reviewed. Constitutional: He is oriented to person, place, and time. He appears well-developed and well-nourished.  HENT:  Head: Normocephalic and atraumatic.  Eyes: Right eye exhibits no discharge. Left eye exhibits no discharge.  Neck: Neck supple.  Cardiovascular: Normal rate, regular rhythm, normal heart sounds and intact distal pulses.   Pulmonary/Chest: Effort normal.  Abdominal: Soft. He exhibits no distension.  Musculoskeletal:       Left wrist: He exhibits no tenderness and no swelling.       Left forearm: He exhibits no tenderness, no bony tenderness and no swelling.       Left hand: He exhibits decreased range of motion, tenderness and swelling. He exhibits no deformity.  Diffuse left hand swelling. Most of his pain is located at the proximal aspect of his hand. No erythema or warmth. No scaphoid tenderness. Normal sensation.  Neurological: He is alert and oriented to person, place, and time.  Skin: Skin is warm and dry.    ED Course  Procedures (including critical care time) Labs  Review Labs Reviewed - No data to display  Imaging Review Dg Hand Complete Left  08/24/2014   CLINICAL DATA:  Left hand pain and swelling following a fall 3 days ago.  EXAM: LEFT HAND - COMPLETE 3+ VIEW  COMPARISON:  Left ring finger radiographs dated 06/19/2013.  FINDINGS: Diffuse dorsal soft tissue swelling. No fracture or dislocation seen. Mild degenerative changes involving multiple interphalangeal joints.  IMPRESSION: No fracture.  Mild degenerative changes.   Electronically Signed   By: Enrique Sack M.D.   On: 08/24/2014 11:48     EKG Interpretation None      MDM    Final diagnoses:  Hand injury, left, initial encounter    No fractures noted on x-ray from his fall off of his lawnmower. No signs of neurovascular compromise. Low suspicion for scaphoid injury given no snuffbox tenderness. At this point will treat with one dose of IM pain medicine but he is already on morphine and oxycodone at home. Stable for discharge home. Placed in Velcro thumb spica for relief.    Ephraim Hamburger, MD 08/24/14 [place], MD 08/24/14 [phone]

## 2014-08-24 NOTE — Discharge Instructions (Signed)
Hand Contusion A hand contusion is a deep bruise on your hand area. Contusions are the result of an injury that caused bleeding under the skin. The contusion may turn blue, purple, or yellow. Minor injuries will give you a painless contusion, but more severe contusions may stay painful and swollen for a few weeks. CAUSES  A contusion is usually caused by a blow, trauma, or direct force to an area of the body. SYMPTOMS   Swelling and redness of the injured area.  Discoloration of the injured area.  Tenderness and soreness of the injured area.  Pain. DIAGNOSIS  The diagnosis can be made by taking a history and performing a physical exam. An X-ray, CT scan, or MRI may be needed to determine if there were any associated injuries, such as broken bones (fractures). TREATMENT  Often, the best treatment for a hand contusion is resting, elevating, icing, and applying cold compresses to the injured area. Over-the-counter medicines may also be recommended for pain control. HOME CARE INSTRUCTIONS   Put ice on the injured area.  Put ice in a plastic bag.  Place a towel between your skin and the bag.  Leave the ice on for 15-20 minutes, 03-04 times a day.  Only take over-the-counter or prescription medicines as directed by your caregiver. Your caregiver may recommend avoiding anti-inflammatory medicines (aspirin, ibuprofen, and naproxen) for 48 hours because these medicines may increase bruising.  If told, use an elastic wrap as directed. This can help reduce swelling. You may remove the wrap for sleeping, showering, and bathing. If your fingers become numb, cold, or blue, take the wrap off and reapply it more loosely.  Elevate your hand with pillows to reduce swelling.  Avoid overusing your hand if it is painful. SEEK IMMEDIATE MEDICAL CARE IF:   You have increased redness, swelling, or pain in your hand.  Your swelling or pain is not relieved with medicines.  You have loss of feeling in  your hand or are unable to move your fingers.  Your hand turns cold or blue.  You have pain when you move your fingers.  Your hand becomes warm to the touch.  Your contusion does not improve in 2 days. MAKE SURE YOU:   Understand these instructions.  Will watch your condition.  Will get help right away if you are not doing well or get worse. Document Released: 05/14/2002 Document Revised: 08/16/2012 Document Reviewed: 05/15/2012 [facility] Patient Information 2015 [place], [place]. This information is not intended to replace advice given to you by your health care provider. Make sure you discuss any questions you have with your health care provider.    RICE: Routine Care for Injuries The routine care of many injuries includes Rest, Ice, Compression, and Elevation (RICE). HOME CARE INSTRUCTIONS  Rest is needed to allow your body to heal. Routine activities can usually be resumed when comfortable. Injured tendons and bones can take up to 6 weeks to heal. Tendons are the cord-like structures that attach muscle to bone.  Ice following an injury helps keep the swelling down and reduces pain.  Put ice in a plastic bag.  Place a towel between your skin and the bag.  Leave the ice on for 15-20 minutes, 3-4 times a day, or as directed by your health care provider. Do this while awake, for the first 24 to 48 hours. After that, continue as directed by your caregiver.  Compression helps keep swelling down. It also gives support and helps with discomfort. If an elastic  bandage has been applied, it should be removed and reapplied every 3 to 4 hours. It should not be applied tightly, but firmly enough to keep swelling down. Watch fingers or toes for swelling, bluish discoloration, coldness, numbness, or excessive pain. If any of these problems occur, remove the bandage and reapply loosely. Contact your caregiver if these problems continue.  Elevation helps reduce swelling and decreases pain. With  extremities, such as the arms, hands, legs, and feet, the injured area should be placed near or above the level of the heart, if possible. SEEK IMMEDIATE MEDICAL CARE IF:  You have persistent pain and swelling.  You develop redness, numbness, or unexpected weakness.  Your symptoms are getting worse rather than improving after several days. These symptoms may indicate that further evaluation or further X-rays are needed. Sometimes, X-rays may not show a small broken bone (fracture) until 1 week or 10 days later. Make a follow-up appointment with your caregiver. Ask when your X-ray results will be ready. Make sure you get your X-ray results. Document Released: 03/06/2001 Document Revised: 11/27/2013 Document Reviewed: 04/23/2011 [facility] Patient Information 2015 [place], [place]. This information is not intended to replace advice given to you by your health care provider. Make sure you discuss any questions you have with your health care provider.

## 2016-06-06 ENCOUNTER — Encounter (HOSPITAL_BASED_OUTPATIENT_CLINIC_OR_DEPARTMENT_OTHER): Payer: Self-pay

## 2016-06-06 ENCOUNTER — Emergency Department (HOSPITAL_BASED_OUTPATIENT_CLINIC_OR_DEPARTMENT_OTHER): Payer: Medicare Other

## 2016-06-06 ENCOUNTER — Inpatient Hospital Stay (HOSPITAL_BASED_OUTPATIENT_CLINIC_OR_DEPARTMENT_OTHER)
Admission: EM | Admit: 2016-06-06 | Discharge: 2016-06-10 | DRG: 513 | Disposition: A | Discharge status: Home Health | Payer: Medicare Other | Attending: Internal Medicine | Admitting: Internal Medicine

## 2016-06-06 DIAGNOSIS — M00031 Staphylococcal arthritis, right wrist: Principal | ICD-10-CM | POA: Diagnosis present

## 2016-06-06 DIAGNOSIS — G8929 Other chronic pain: Secondary | ICD-10-CM | POA: Diagnosis present

## 2016-06-06 DIAGNOSIS — Z6838 Body mass index (BMI) 38.0-38.9, adult: Secondary | ICD-10-CM | POA: Diagnosis not present

## 2016-06-06 DIAGNOSIS — L0390 Cellulitis, unspecified: Secondary | ICD-10-CM | POA: Insufficient documentation

## 2016-06-06 DIAGNOSIS — W1830XA Fall on same level, unspecified, initial encounter: Secondary | ICD-10-CM | POA: Diagnosis present

## 2016-06-06 DIAGNOSIS — I10 Essential (primary) hypertension: Secondary | ICD-10-CM | POA: Diagnosis present

## 2016-06-06 DIAGNOSIS — M549 Dorsalgia, unspecified: Secondary | ICD-10-CM | POA: Diagnosis present

## 2016-06-06 DIAGNOSIS — E876 Hypokalemia: Secondary | ICD-10-CM | POA: Diagnosis present

## 2016-06-06 DIAGNOSIS — F17200 Nicotine dependence, unspecified, uncomplicated: Secondary | ICD-10-CM | POA: Diagnosis present

## 2016-06-06 DIAGNOSIS — L03113 Cellulitis of right upper limb: Secondary | ICD-10-CM | POA: Diagnosis present

## 2016-06-06 DIAGNOSIS — I69354 Hemiplegia and hemiparesis following cerebral infarction affecting left non-dominant side: Secondary | ICD-10-CM

## 2016-06-06 DIAGNOSIS — B9562 Methicillin resistant Staphylococcus aureus infection as the cause of diseases classified elsewhere: Secondary | ICD-10-CM | POA: Diagnosis present

## 2016-06-06 DIAGNOSIS — M79601 Pain in right arm: Secondary | ICD-10-CM

## 2016-06-06 DIAGNOSIS — R7989 Other specified abnormal findings of blood chemistry: Secondary | ICD-10-CM | POA: Diagnosis present

## 2016-06-06 DIAGNOSIS — E669 Obesity, unspecified: Secondary | ICD-10-CM | POA: Diagnosis present

## 2016-06-06 DIAGNOSIS — W19XXXA Unspecified fall, initial encounter: Secondary | ICD-10-CM

## 2016-06-06 DIAGNOSIS — R945 Abnormal results of liver function studies: Secondary | ICD-10-CM | POA: Diagnosis present

## 2016-06-06 DIAGNOSIS — R748 Abnormal levels of other serum enzymes: Secondary | ICD-10-CM | POA: Diagnosis not present

## 2016-06-06 HISTORY — DX: Essential (primary) hypertension: I10

## 2016-06-06 HISTORY — DX: Cerebral infarction, unspecified: I63.9

## 2016-06-06 LAB — CBC WITH DIFFERENTIAL/PLATELET
BASOS ABS: 0 10*3/uL (ref 0.0–0.1)
Basophils Relative: 0 %
EOS PCT: 2 %
Eosinophils Absolute: 0.1 10*3/uL (ref 0.0–0.7)
HEMATOCRIT: 42.3 % (ref 39.0–52.0)
HEMOGLOBIN: 14.7 g/dL (ref 13.0–17.0)
LYMPHS PCT: 23 %
Lymphs Abs: 2 10*3/uL (ref 0.7–4.0)
MCH: 30.9 pg (ref 26.0–34.0)
MCHC: 34.8 g/dL (ref 30.0–36.0)
MCV: 89.1 fL (ref 78.0–100.0)
Monocytes Absolute: 0.9 10*3/uL (ref 0.1–1.0)
Monocytes Relative: 10 %
NEUTROS ABS: 5.8 10*3/uL (ref 1.7–7.7)
NEUTROS PCT: 65 %
PLATELETS: 244 10*3/uL (ref 150–400)
RBC: 4.75 MIL/uL (ref 4.22–5.81)
RDW: 13 % (ref 11.5–15.5)
WBC: 8.8 10*3/uL (ref 4.0–10.5)

## 2016-06-06 LAB — COMPREHENSIVE METABOLIC PANEL
ALK PHOS: 56 U/L (ref 38–126)
ALT: 71 U/L — AB (ref 17–63)
AST: 56 U/L — AB (ref 15–41)
Albumin: 3.6 g/dL (ref 3.5–5.0)
Anion gap: 8 (ref 5–15)
BILIRUBIN TOTAL: 1 mg/dL (ref 0.3–1.2)
BUN: 10 mg/dL (ref 6–20)
CALCIUM: 9 mg/dL (ref 8.9–10.3)
CO2: 26 mmol/L (ref 22–32)
Chloride: 102 mmol/L (ref 101–111)
Creatinine, Ser: 0.94 mg/dL (ref 0.61–1.24)
GFR calc Af Amer: >60 mL/min (ref >60)
GFR calc non Af Amer: >60 mL/min (ref >60)
GLUCOSE: 100 mg/dL — AB (ref 65–99)
Potassium: 3.3 mmol/L — ABNORMAL LOW (ref 3.5–5.1)
Sodium: 136 mmol/L (ref 135–145)
TOTAL PROTEIN: 7.9 g/dL (ref 6.5–8.1)

## 2016-06-06 LAB — C-REACTIVE PROTEIN: CRP: 10.7 mg/dL — ABNORMAL HIGH (ref <1.0)

## 2016-06-06 LAB — URIC ACID: Uric Acid, Serum: 6.1 mg/dL (ref 4.4–7.6)

## 2016-06-06 LAB — SEDIMENTATION RATE: Sed Rate: 40 mm/hr — ABNORMAL HIGH (ref 0–16)

## 2016-06-06 MED ORDER — VANCOMYCIN HCL IN DEXTROSE 1-5 GM/200ML-% IV SOLN
1000.0000 mg | Freq: Once | INTRAVENOUS | Status: AC
  Administered 2016-06-06: 1000 mg via INTRAVENOUS
  Filled 2016-06-06: qty 200

## 2016-06-06 MED ORDER — OLMESARTAN-AMLODIPINE-HCTZ 40-10-25 MG PO TABS: 1.0000 | ORAL_TABLET | Freq: Every day | ORAL | Status: DC

## 2016-06-06 MED ORDER — LORAZEPAM 1 MG PO TABS
2.0000 mg | ORAL_TABLET | Freq: Three times a day (TID) | ORAL | Status: DC
  Administered 2016-06-06: 2 mg via ORAL
  Filled 2016-06-06: qty 2

## 2016-06-06 MED ORDER — KETOROLAC TROMETHAMINE 30 MG/ML IJ SOLN
30.0000 mg | Freq: Once | INTRAMUSCULAR | Status: AC
  Administered 2016-06-06: 30 mg via INTRAVENOUS
  Filled 2016-06-06: qty 1

## 2016-06-06 MED ORDER — HYDROCHLOROTHIAZIDE 25 MG PO TABS: 25.0000 mg | ORAL_TABLET | Freq: Every day | ORAL | Status: DC

## 2016-06-06 MED ORDER — AMLODIPINE BESYLATE 10 MG PO TABS
10.0000 mg | ORAL_TABLET | Freq: Every day | ORAL | Status: DC
  Administered 2016-06-07 – 2016-06-10 (×3): 10 mg via ORAL
  Filled 2016-06-06 (×4): qty 1

## 2016-06-06 MED ORDER — IBUPROFEN 200 MG PO TABS
800.0000 mg | ORAL_TABLET | Freq: Three times a day (TID) | ORAL | Status: DC
  Administered 2016-06-06 – 2016-06-07 (×2): 800 mg via ORAL
  Filled 2016-06-06 (×2): qty 4

## 2016-06-06 MED ORDER — MORPHINE SULFATE (PF) 4 MG/ML IV SOLN
4.0000 mg | Freq: Once | INTRAVENOUS | Status: AC
  Administered 2016-06-06: 4 mg via INTRAVENOUS
  Filled 2016-06-06: qty 1

## 2016-06-06 MED ORDER — NICOTINE 21 MG/24HR TD PT24
21.0000 mg | MEDICATED_PATCH | Freq: Every day | TRANSDERMAL | Status: DC
  Administered 2016-06-06 – 2016-06-10 (×5): 21 mg via TRANSDERMAL
  Filled 2016-06-06 (×5): qty 1

## 2016-06-06 MED ORDER — PANTOPRAZOLE SODIUM 40 MG PO TBEC
40.0000 mg | DELAYED_RELEASE_TABLET | Freq: Every day | ORAL | Status: DC
  Administered 2016-06-06 – 2016-06-10 (×5): 40 mg via ORAL
  Filled 2016-06-06 (×5): qty 1

## 2016-06-06 MED ORDER — VANCOMYCIN HCL IN DEXTROSE 1-5 GM/200ML-% IV SOLN
1000.0000 mg | Freq: Three times a day (TID) | INTRAVENOUS | Status: DC
  Administered 2016-06-06 – 2016-06-09 (×8): 1000 mg via INTRAVENOUS
  Filled 2016-06-06 (×12): qty 200

## 2016-06-06 MED ORDER — OXYCODONE HCL 5 MG PO TABS: 15.0000 mg | ORAL_TABLET | ORAL | Status: DC | PRN

## 2016-06-06 MED ORDER — ENOXAPARIN SODIUM 40 MG/0.4ML ~~LOC~~ SOLN
40.0000 mg | SUBCUTANEOUS | Status: DC
  Administered 2016-06-07: 40 mg via SUBCUTANEOUS
  Filled 2016-06-06: qty 0.4

## 2016-06-06 MED ORDER — HYDROMORPHONE HCL 1 MG/ML IJ SOLN
2.0000 mg | Freq: Once | INTRAMUSCULAR | Status: AC
  Administered 2016-06-06: 2 mg via INTRAVENOUS
  Filled 2016-06-06: qty 2

## 2016-06-06 MED ORDER — ONDANSETRON HCL 4 MG/2ML IJ SOLN: 4.0000 mg | Freq: Four times a day (QID) | INTRAMUSCULAR | Status: DC | PRN

## 2016-06-06 MED ORDER — MORPHINE SULFATE ER 15 MG PO TBCR
15.0000 mg | EXTENDED_RELEASE_TABLET | Freq: Two times a day (BID) | ORAL | Status: DC
  Administered 2016-06-07 – 2016-06-09 (×5): 15 mg via ORAL
  Filled 2016-06-06 (×5): qty 1

## 2016-06-06 MED ORDER — OXYCODONE HCL 5 MG PO TABS
20.0000 mg | ORAL_TABLET | ORAL | Status: DC
  Administered 2016-06-07 – 2016-06-08 (×10): 20 mg via ORAL
  Filled 2016-06-06 (×10): qty 4

## 2016-06-06 MED ORDER — TEMAZEPAM 15 MG PO CAPS
15.0000 mg | ORAL_CAPSULE | Freq: Every evening | ORAL | Status: DC | PRN
  Administered 2016-06-07 (×2): 15 mg via ORAL
  Filled 2016-06-06 (×2): qty 1

## 2016-06-06 MED ORDER — POTASSIUM CHLORIDE 20 MEQ/15ML (10%) PO SOLN
40.0000 meq | Freq: Every day | ORAL | Status: DC
  Administered 2016-06-06 – 2016-06-09 (×3): 40 meq via ORAL
  Filled 2016-06-06 (×5): qty 30

## 2016-06-06 MED ORDER — HYDROCHLOROTHIAZIDE 25 MG PO TABS
25.0000 mg | ORAL_TABLET | Freq: Every day | ORAL | Status: DC
  Administered 2016-06-07: 25 mg via ORAL
  Filled 2016-06-06: qty 1

## 2016-06-06 MED ORDER — IRBESARTAN 300 MG PO TABS
300.0000 mg | ORAL_TABLET | Freq: Every day | ORAL | Status: DC
  Administered 2016-06-07 – 2016-06-10 (×4): 300 mg via ORAL
  Filled 2016-06-06 (×4): qty 1

## 2016-06-06 MED ORDER — HYDROMORPHONE HCL 1 MG/ML IJ SOLN
2.0000 mg | INTRAMUSCULAR | Status: DC | PRN
Start: 2016-06-06 — End: 2016-06-08
  Administered 2016-06-07: 2 mg via INTRAVENOUS
  Filled 2016-06-06: qty 2

## 2016-06-06 MED ORDER — PIPERACILLIN-TAZOBACTAM 3.375 G IVPB
3.3750 g | Freq: Three times a day (TID) | INTRAVENOUS | Status: DC
  Administered 2016-06-07 – 2016-06-09 (×7): 3.375 g via INTRAVENOUS
  Filled 2016-06-06 (×11): qty 50

## 2016-06-06 MED ORDER — HYDROMORPHONE HCL 1 MG/ML IJ SOLN
1.0000 mg | Freq: Once | INTRAMUSCULAR | Status: AC
  Administered 2016-06-06: 1 mg via INTRAVENOUS
  Filled 2016-06-06: qty 1

## 2016-06-06 MED ORDER — PIPERACILLIN-TAZOBACTAM 3.375 G IVPB 30 MIN
3.3750 g | INTRAVENOUS | Status: AC
  Administered 2016-06-06: 3.375 g via INTRAVENOUS
  Filled 2016-06-06: qty 50

## 2016-06-06 MED ORDER — ONDANSETRON HCL 4 MG PO TABS: 4.0000 mg | ORAL_TABLET | Freq: Four times a day (QID) | ORAL | Status: DC | PRN

## 2016-06-06 NOTE — ED Notes (Signed)
Report called to [facility] [place] to [employer] . [facility] here presently.

## 2016-06-06 NOTE — Progress Notes (Signed)
Pharmacy Antibiotic Note  Ronnie Moore is a 55 y.o. male admitted on 06/06/2016 with R wrist cellulitis.  Pharmacy has been consulted for Vancomycin and Zosyn dosing.  Vanc 1gm IV given in ED ~1715  Plan: Vancomycin 1gm IV q8h - give next dose now as pt did not receive full load in ED Zosyn 3.375gm IV now over 30 min then 3.375gm IV q8h - subsequent doses over 4 hours Will f/u micro data, renal function, and pt's clinical condition Vanc trough at Css in obese pt   Height: 5\' 8"  (172.7 cm) Weight: 249 lb 12.5 oz (113.3 kg) IBW/kg (Calculated) : 68.4  Temp (24hrs), Avg:99 F (37.2 C), Min:98.8 F (37.1 C), Max:99.3 F (37.4 C)   Recent Labs Lab 06/06/16 1620  WBC 8.8  CREATININE 0.94    Estimated Creatinine Clearance: 111.1 mL/min (by C-G formula based on Cr of 0.94).    Allergies  Allergen Reactions  . Metaxalone Swelling    Reaction to Skelaxin - lips swelled    Antimicrobials this admission: 7/2 Vanc >>  7/2 Zosyn >>   Dose adjustments this admission: n/a  Thank you for allowing pharmacy to be a part of this patient's care.  Sherlon Handing, PharmD, BCPS Clinical pharmacist, pager [phone] 06/06/2016 10:31 PM

## 2016-06-06 NOTE — H&P (Signed)
History and Physical    Ronnie Moore [id number] DOB: 08/11/1961 DOA: 06/06/2016  PCP: No primary care provider on file.   Patient coming from: Home.  Chief Complaint: Right wrist and hand pain.  HPI: Ronnie Moore is a 55 y.o. male with medical history significant of chronic back pain, CVA, hypertension, osteoarthritis, previous MRSA infection, obesity who was transferred from [facility] due to right wrist and hand pain after falling and injuring area a week ago and low-grade fever for the past 2 days. The patient states that he has post CVA chronic circulation issues of the right upper extremity.  ED Course: Workup revealed hypokalemia of 3.3 mmol per liter, elevated CRP and sedimentation rate and right wrist radiograph showing soft tissue swelling of the wrist and hand. The patient received IV antibiotics and analgesics. He was transferred to [facility] for further treatment and evaluation.   Review of Systems: As per HPI otherwise 10 point review of systems negative.   Past Medical History  Diagnosis Date  . Chronic back pain   . Stroke ([place])   . Hypertension     Past Surgical History  Procedure Laterality Date  . Back surgery       reports that he has been smoking.  He does not have any smokeless tobacco history on file. He reports that he drinks alcohol. He reports that he does not use illicit drugs.  Allergies  Allergen Reactions  . Metaxalone Swelling    Reaction to Skelaxin - lips swelled    Family History  Problem Relation Age of Onset  . Arthritis Mother   . Hypertension Father   . Heart disease Father   . Diabetes Mellitus II Sister      Prior to Admission medications   Medication Sig Start Date End Date Taking? Authorizing Provider  Olmesartan-Amlodipine-HCTZ (TRIBENZOR) 40-10-25 MG TABS Take 1 tablet by mouth daily.   Yes Historical Provider, MD  Oxycodone HCl 20 MG TABS Take 20 mg by mouth every 4 (four) hours. 05/24/16  Yes  Historical Provider, MD  ibuprofen (ADVIL,MOTRIN) 800 MG tablet Take 1 tablet (800 mg total) by mouth 3 (three) times daily. Patient not taking: Reported on 06/06/2016 08/24/14   Sherwood Gambler, MD  LORazepam (ATIVAN) 2 MG tablet Take 1 tablet (2 mg total) by mouth 3 (three) times daily. Patient not taking: Reported on 06/06/2016 08/30/12   Leonard Schwartz, MD  meloxicam (MOBIC) 15 MG tablet Take 1 tablet (15 mg total) by mouth daily. Patient not taking: Reported on 06/06/2016 08/30/12   Leonard Schwartz, MD  morphine (AVINZA) 30 MG 24 hr capsule Take 1 capsule (30 mg total) by mouth daily. Patient not taking: Reported on 06/06/2016 08/30/12   Leonard Schwartz, MD  oxyCODONE (ROXICODONE) 15 MG immediate release tablet Take 1 tablet (15 mg total) by mouth every 4 (four) hours as needed. Patient not taking: Reported on 06/06/2016 08/30/12   Leonard Schwartz, MD    Physical Exam: Filed Vitals:   06/06/16 1845 06/06/16 1946 06/06/16 1950 06/06/16 2058  BP:  119/79 119/79 161/66  Pulse: 94 101 103 102  Temp:    98.8 F (37.1 C)  TempSrc:    Oral  Resp:   18 18  Height:    5\' 8"  (1.727 m)  Weight:    113.3 kg (249 lb 12.5 oz)  SpO2: 96% 95% 99% 95%      Constitutional: NAD, calm, comfortable Filed Vitals:   06/06/16 1845 06/06/16  1946 06/06/16 1950 06/06/16 2058  BP:  119/79 119/79 161/66  Pulse: 94 101 103 102  Temp:    98.8 F (37.1 C)  TempSrc:    Oral  Resp:   18 18  Height:    5\' 8"  (1.727 m)  Weight:    113.3 kg (249 lb 12.5 oz)  SpO2: 96% 95% 99% 95%   Eyes: PERRL, lids and conjunctivae normal ENMT: Mucous membranes are moist. Posterior pharynx clear of any exudate or lesions. Neck: normal, supple, no masses, no thyromegaly Respiratory: clear to auscultation bilaterally, no wheezing, no crackles. Normal respiratory effort. No accessory muscle use.  Cardiovascular: Regular rate and rhythm, [id number] systolic murmur/ rubs / gallops. No extremity edema. 2+ pedal pulses. No carotid bruits.  Abdomen: no  tenderness, no masses palpated. No hepatosplenomegaly. Bowel sounds positive.  Musculoskeletal: no clubbing / cyanosis. Positive for right wrist and hand decreased ROM with tenderness, edema and calor. Skin: no rashes, lesions, ulcers. No induration Neurologic: CN 2-12 grossly intact. Sensation intact. Unable to fully evaluate due to pain and discomfort of the right upper extremity. Psychiatric: Normal judgment and insight. Alert and oriented x 4. Normal mood.    Labs on Admission: I have personally reviewed following labs and imaging studies  CBC:  Recent Labs Lab 06/06/16 1620  WBC 8.8  NEUTROABS 5.8  HGB 14.7  HCT 42.3  MCV 89.1  PLT [id number]   Basic Metabolic Panel:  Recent Labs Lab 06/06/16 1620  NA 136  K 3.3*  CL 102  CO2 26  GLUCOSE 100*  BUN 10  CREATININE 0.94  CALCIUM 9.0   GFR: Estimated Creatinine Clearance: 111.1 mL/min (by C-G formula based on Cr of 0.94). Liver Function Tests:  Recent Labs Lab 06/06/16 1620  AST 56*  ALT 71*  ALKPHOS 56  BILITOT 1.0  PROT 7.9  ALBUMIN 3.6     Radiological Exams on Admission: Dg Wrist Complete Right  06/06/2016  CLINICAL DATA:  Status post fall, with right wrist pain. Initial encounter. EXAM: RIGHT WRIST - COMPLETE 3+ VIEW COMPARISON:  Right forearm radiographs from 06/19/2013 FINDINGS: There is no evidence of acute fracture or dislocation. There appears to be mild chronic deformity involving the distal scaphoid, possibly reflecting remote injury. The carpal rows are intact, and demonstrate normal alignment. The joint spaces are preserved. Diffuse dorsal soft tissue swelling is noted along the wrist and hand. IMPRESSION: 1. No evidence of fracture or dislocation. 2. Apparent mild chronic deformity involving the distal scaphoid. 3. Diffuse dorsal soft tissue swelling noted along the wrist and hand. Electronically Signed   By: Garald Balding M.D.   On: 06/06/2016 16:25    Assessment/Plan Principal Problem:   Cellulitis  of right upper extremity Admit to MedSurg/inpatient. Continue scheduled and when necessary analgesics. Dose limited hydromorphone 2 mg IVP 4 Toradol 30 mg IVP 1. Continue ibuprofen 800 mg by mouth 3 times a day. Discontinue meloxicam. Pantoprazole 40 mg by mouth daily for GI prophylaxis Continue IV antibiotics. Dr. Lenon Curt to consult in the morning.  Active Problems:   Hypokalemia Likely due to the use of hydrochlorothiazide without potassium supplementation. Potassium supplementation added. Follow-up potassium level.    Essential hypertension Continue Avapro 300 mg by mouth daily. Continue amlodipine 10 mg by mouth daily. Continue hydrochlorothiazide 25 mg by mouth daily. Monitor blood pressure.    Abnormal LFTs States he only drinks alcohol in small amounts very sporadically. History of hepatitis B per patient. He believes it is non-active hep B.  Check acute hepatitis panel. Follow-up LFTs.    Chronic back pain Continue long acting morphine 15 mg by mouth twice a day. Continue oxycodone 20 mg by mouth every 4 hours when necessary. Continue ibuprofen 800 mg by mouth 3 times a day Continue lorazepam 2 mg by mouth 3 times a day when necessary as needed for back spasms.    DVT prophylaxis: Lovenox. Code Status: Full code. Family Communication:  Disposition Plan: Admit for IV antibiotic therapy and pain management for several days. Consults called: Dr. Lenon Curt (hand surgery) Admission status: Inpatient/MedSurg    Reubin Milan MD [facility] Pager [phone].  If 7PM-7AM, please contact night-coverage [email or web address] Password [facility]  06/06/2016, 10:46 PM

## 2016-06-06 NOTE — Plan of Care (Signed)
55 yo M, h/o transverse myelitis, wheelchair bound.  Fall 1 week ago, landed on wrist, having worse wrist pain and swelling since then.  Cellulitis vs septic arthritis with severe pain on movement.  EDP didn't want to tap since he would have to go through cellulitic skin to get to joint.  Spoke with Dr. Lenon Curt with hand, he said just give ABx, and call him if we needed him to consult but he wasn't planning on consulting at this time.  Med-surg obs.  I suspect we probably will end up calling Coley back for formal consult in AM.

## 2016-06-06 NOTE — ED Notes (Addendum)
Fell approx 1 week ago-pain to right hand, wrist and forearm-reports hx of stroke that affected circulation in his right hand-NAD-steady shuffle gait (normal for pt)

## 2016-06-06 NOTE — ED Provider Notes (Signed)
CSN: [id number]     Arrival date & time 06/06/16  1507 History  By signing my name below, I, Gwenlyn Fudge, attest that this documentation has been prepared under the direction and in the presence of Julianne Rice, MD. Electronically Signed: Gwenlyn Fudge, ED Scribe. 06/06/2016. 5:24 PM.    Chief Complaint  Patient presents with  . Fall    The history is provided by the patient. No language interpreter was used.    HPI Comments: Ronnie Moore is a 55 y.o. male with PMHx of stroke, arthritis, and tendonitis who presents to the Emergency Department complaining of gradual onset and worsening constant right hand and wrist pain s/p fall while attempting to use bathroom a week PTA. Pt reports associated diaphoresis, generalized weakness, and swelling of right hand. Pt reports swelling increased when he awoke the next morning. He states he has used pain relievers with no relief to symptoms. Pt denies any other injuries from fall. Pt reports FHx of gout. Pt states that he is normally bound to a wheel chair. Pt denies fever, chills, nausea, vomiting.   Past Medical History  Diagnosis Date  . Chronic back pain   . Stroke ([place])   . Hypertension    Past Surgical History  Procedure Laterality Date  . Back surgery     No family history on file. Social History  Substance Use Topics  . Smoking status: Current Every Day Smoker  . Smokeless tobacco: None  . Alcohol Use: Yes     Comment: occ    Review of Systems  Constitutional: Positive for diaphoresis. Negative for fever and chills.  Respiratory: Negative for cough and shortness of breath.   Cardiovascular: Negative for chest pain.  Gastrointestinal: Negative for nausea, vomiting and abdominal pain.  Musculoskeletal: Positive for joint swelling and arthralgias. Negative for myalgias, back pain, neck pain and neck stiffness.  Skin: Positive for color change. Negative for rash and wound.  Neurological: Positive for weakness (generalized).  Negative for dizziness, light-headedness and numbness.  All other systems reviewed and are negative.     Allergies  Review of patient's allergies indicates no known allergies.  Home Medications   Prior to Admission medications   Medication Sig Start Date End Date Taking? Authorizing Provider  HYDROCHLOROTHIAZIDE PO Take by mouth.    Historical Provider, MD  ibuprofen (ADVIL,MOTRIN) 800 MG tablet Take 1 tablet (800 mg total) by mouth 3 (three) times daily. 08/24/14   Sherwood Gambler, MD  LORazepam (ATIVAN) 2 MG tablet Take 1 tablet (2 mg total) by mouth 3 (three) times daily. 08/30/12   Leonard Schwartz, MD  meloxicam (MOBIC) 15 MG tablet Take 1 tablet (15 mg total) by mouth daily. 08/30/12   Leonard Schwartz, MD  morphine (AVINZA) 30 MG 24 hr capsule Take 1 capsule (30 mg total) by mouth daily. 08/30/12   Leonard Schwartz, MD  oxyCODONE (ROXICODONE) 15 MG immediate release tablet Take 1 tablet (15 mg total) by mouth every 4 (four) hours as needed. 08/30/12   Leonard Schwartz, MD   BP 119/79 mmHg  Pulse 103  Temp(Src) 98.9 F (37.2 C) (Oral)  Resp 18  Ht 5\' 8"  (1.727 m)  Wt 250 lb (113.399 kg)  BMI 38.02 kg/m2  SpO2 99% Physical Exam  Constitutional: He is oriented to person, place, and time. He appears well-developed and well-nourished. No distress.  HENT:  Head: Normocephalic and atraumatic.  Mouth/Throat: Oropharynx is clear and moist.  Eyes: EOM are normal. Pupils are equal, round, and reactive  to light.  Neck: Normal range of motion. Neck supple.  Cardiovascular: Normal rate and regular rhythm.   Pulmonary/Chest: Effort normal and breath sounds normal. No respiratory distress. He has no wheezes. He has no rales.  Abdominal: Soft. Bowel sounds are normal. He exhibits no distension and no mass. There is no tenderness. There is no rebound and no guarding.  Musculoskeletal: Normal range of motion. He exhibits edema and tenderness.  2+ pitting edema to the dorsal surface of the right hand, wrist  and distal forearm. There is erythema and tenderness to palpation. Warmth is appreciated. 2+ radial pulses bilaterally. Decreased range of motion of the right wrist due to pain.  Neurological: He is alert and oriented to person, place, and time.  3/5 left upper and lower extremity weakness due to prior stroke. 5/5 motor in upper and right lower extremities. Sensation is grossly intact.  Skin: Skin is warm and dry. No rash noted. No erythema.  Psychiatric: He has a normal mood and affect. His behavior is normal.  Nursing note and vitals reviewed.   ED Course  Procedures (including critical care time)  DIAGNOSTIC STUDIES: Oxygen Saturation is 98% on RA, normal by my interpretation.    COORDINATION OF CARE: 3:55 PM Discussed treatment plan with pt at bedside which includes DG Wrist and lab work and pt agreed to plan.  Labs Review Labs Reviewed  COMPREHENSIVE METABOLIC PANEL - Abnormal; Notable for the following:    Potassium 3.3 (*)    Glucose, Bld 100 (*)    AST 56 (*)    ALT 71 (*)    All other components within normal limits  SEDIMENTATION RATE - Abnormal; Notable for the following:    Sed Rate 40 (*)    All other components within normal limits  C-REACTIVE PROTEIN - Abnormal; Notable for the following:    CRP 10.7 (*)    All other components within normal limits  CBC WITH DIFFERENTIAL/PLATELET  URIC ACID    Imaging Review Dg Wrist Complete Right  06/06/2016  CLINICAL DATA:  Status post fall, with right wrist pain. Initial encounter. EXAM: RIGHT WRIST - COMPLETE 3+ VIEW COMPARISON:  Right forearm radiographs from 06/19/2013 FINDINGS: There is no evidence of acute fracture or dislocation. There appears to be mild chronic deformity involving the distal scaphoid, possibly reflecting remote injury. The carpal rows are intact, and demonstrate normal alignment. The joint spaces are preserved. Diffuse dorsal soft tissue swelling is noted along the wrist and hand. IMPRESSION: 1. No  evidence of fracture or dislocation. 2. Apparent mild chronic deformity involving the distal scaphoid. 3. Diffuse dorsal soft tissue swelling noted along the wrist and hand. Electronically Signed   By: Garald Balding M.D.   On: 06/06/2016 16:25   I have personally reviewed and evaluated these images and lab results as part of my medical decision-making.   EKG Interpretation None      MDM   Final diagnoses:  Cellulitis of right upper extremity      Concern for cellulitis of the right arm. Patient has limited motion of the right wrist. He has a normal uric acid level with elevated CRP and sedimentation rate. Discussed with Dr. Lenon Curt. Start IV antibiotics in the emergency department. Arthrocentesis delayed due to overlying erythema concerning for cellulitis. Discussed with Dr. Fabio Neighbors. Will except to [facility]urg bed at [facility], MD 06/06/16 2358

## 2016-06-07 ENCOUNTER — Encounter (HOSPITAL_COMMUNITY)
Admission: EM | Disposition: A | Discharge status: Home Health | Payer: Self-pay | Source: Home / Self Care | Attending: Internal Medicine

## 2016-06-07 ENCOUNTER — Encounter (HOSPITAL_COMMUNITY): Payer: Self-pay | Admitting: Anesthesiology

## 2016-06-07 ENCOUNTER — Inpatient Hospital Stay (HOSPITAL_COMMUNITY): Payer: Medicare Other | Admitting: Certified Registered Nurse Anesthetist

## 2016-06-07 DIAGNOSIS — M549 Dorsalgia, unspecified: Secondary | ICD-10-CM

## 2016-06-07 DIAGNOSIS — R7989 Other specified abnormal findings of blood chemistry: Secondary | ICD-10-CM

## 2016-06-07 DIAGNOSIS — G8929 Other chronic pain: Secondary | ICD-10-CM

## 2016-06-07 HISTORY — PX: I & D EXTREMITY: SHX5045

## 2016-06-07 LAB — CBC WITH DIFFERENTIAL/PLATELET
BASOS ABS: 0 10*3/uL (ref 0.0–0.1)
Basophils Relative: 0 %
EOS PCT: 3 %
Eosinophils Absolute: 0.2 10*3/uL (ref 0.0–0.7)
HEMATOCRIT: 40.9 % (ref 39.0–52.0)
Hemoglobin: 13.4 g/dL (ref 13.0–17.0)
LYMPHS ABS: 2 10*3/uL (ref 0.7–4.0)
LYMPHS PCT: 29 %
MCH: 29.9 pg (ref 26.0–34.0)
MCHC: 32.8 g/dL (ref 30.0–36.0)
MCV: 91.3 fL (ref 78.0–100.0)
MONO ABS: 0.8 10*3/uL (ref 0.1–1.0)
Monocytes Relative: 12 %
NEUTROS ABS: 3.9 10*3/uL (ref 1.7–7.7)
Neutrophils Relative %: 56 %
PLATELETS: 252 10*3/uL (ref 150–400)
RBC: 4.48 MIL/uL (ref 4.22–5.81)
RDW: 13.1 % (ref 11.5–15.5)
WBC: 6.9 10*3/uL (ref 4.0–10.5)

## 2016-06-07 LAB — CREATININE, SERUM: CREATININE: 0.9 mg/dL (ref 0.61–1.24)

## 2016-06-07 LAB — COMPREHENSIVE METABOLIC PANEL
ALBUMIN: 3 g/dL — AB (ref 3.5–5.0)
ALT: 61 U/L (ref 17–63)
ANION GAP: 18 — AB (ref 5–15)
AST: 44 U/L — AB (ref 15–41)
Alkaline Phosphatase: 56 U/L (ref 38–126)
BUN: 11 mg/dL (ref 6–20)
CHLORIDE: 98 mmol/L — AB (ref 101–111)
CO2: 25 mmol/L (ref 22–32)
Calcium: 9.9 mg/dL (ref 8.9–10.3)
Creatinine, Ser: 0.93 mg/dL (ref 0.61–1.24)
GFR calc Af Amer: >60 mL/min (ref >60)
GFR calc non Af Amer: >60 mL/min (ref >60)
GLUCOSE: 117 mg/dL — AB (ref 65–99)
POTASSIUM: 3.7 mmol/L (ref 3.5–5.1)
SODIUM: 141 mmol/L (ref 135–145)
TOTAL PROTEIN: 6.8 g/dL (ref 6.5–8.1)
Total Bilirubin: 0.6 mg/dL (ref 0.3–1.2)

## 2016-06-07 LAB — CBC
HEMATOCRIT: 42.8 % (ref 39.0–52.0)
HEMOGLOBIN: 14.1 g/dL (ref 13.0–17.0)
MCH: 30 pg (ref 26.0–34.0)
MCHC: 32.9 g/dL (ref 30.0–36.0)
MCV: 91.1 fL (ref 78.0–100.0)
Platelets: 211 10*3/uL (ref 150–400)
RBC: 4.7 MIL/uL (ref 4.22–5.81)
RDW: 13 % (ref 11.5–15.5)
WBC: 8.3 10*3/uL (ref 4.0–10.5)

## 2016-06-07 LAB — MRSA PCR SCREENING: MRSA by PCR: POSITIVE — AB

## 2016-06-07 SURGERY — IRRIGATION AND DEBRIDEMENT EXTREMITY
Anesthesia: General | Laterality: Right

## 2016-06-07 MED ORDER — OXYCODONE HCL 5 MG PO TABS
ORAL_TABLET | ORAL | Status: AC
  Filled 2016-06-07: qty 1

## 2016-06-07 MED ORDER — HYDROMORPHONE HCL 1 MG/ML IJ SOLN
0.2500 mg | INTRAMUSCULAR | Status: DC | PRN
  Administered 2016-06-07 (×2): 0.5 mg via INTRAVENOUS

## 2016-06-07 MED ORDER — BUPIVACAINE HCL (PF) 0.25 % IJ SOLN
INTRAMUSCULAR | Status: AC
  Filled 2016-06-07: qty 30

## 2016-06-07 MED ORDER — OXYCODONE HCL 5 MG/5ML PO SOLN: 5.0000 mg | Freq: Once | ORAL | Status: DC | PRN

## 2016-06-07 MED ORDER — SUCCINYLCHOLINE CHLORIDE 20 MG/ML IJ SOLN
INTRAMUSCULAR | Status: DC | PRN
  Administered 2016-06-07: 80 mg via INTRAVENOUS

## 2016-06-07 MED ORDER — OXYCODONE HCL 5 MG PO TABS
5.0000 mg | ORAL_TABLET | Freq: Once | ORAL | Status: AC | PRN
  Administered 2016-06-07: 5 mg via ORAL

## 2016-06-07 MED ORDER — DOCUSATE SODIUM 50 MG PO CAPS
50.0000 mg | ORAL_CAPSULE | Freq: Two times a day (BID) | ORAL | Status: DC
  Administered 2016-06-07 – 2016-06-09 (×5): 50 mg via ORAL
  Filled 2016-06-07 (×6): qty 1

## 2016-06-07 MED ORDER — MIDAZOLAM HCL 5 MG/5ML IJ SOLN
INTRAMUSCULAR | Status: DC | PRN
  Administered 2016-06-07: 2 mg via INTRAVENOUS

## 2016-06-07 MED ORDER — LIDOCAINE HCL (CARDIAC) 20 MG/ML IV SOLN
INTRAVENOUS | Status: DC | PRN
  Administered 2016-06-07: 80 mg via INTRAVENOUS

## 2016-06-07 MED ORDER — BUPIVACAINE HCL (PF) 0.25 % IJ SOLN
INTRAMUSCULAR | Status: DC | PRN
Start: 2016-06-07 — End: 2016-06-07
  Administered 2016-06-07: 17 mL

## 2016-06-07 MED ORDER — FENTANYL CITRATE (PF) 100 MCG/2ML IJ SOLN
INTRAMUSCULAR | Status: DC | PRN
  Administered 2016-06-07: 150 ug via INTRAVENOUS

## 2016-06-07 MED ORDER — MUPIROCIN 2 % EX OINT
1.0000 "application " | TOPICAL_OINTMENT | Freq: Two times a day (BID) | CUTANEOUS | Status: DC
  Administered 2016-06-07 – 2016-06-10 (×8): 1 via NASAL
  Filled 2016-06-07: qty 22

## 2016-06-07 MED ORDER — SODIUM CHLORIDE 0.9 % IR SOLN
Status: DC | PRN
  Administered 2016-06-07: 1000 mL

## 2016-06-07 MED ORDER — LACTATED RINGERS IV SOLN: INTRAVENOUS | Status: DC

## 2016-06-07 MED ORDER — LACTATED RINGERS IV SOLN
INTRAVENOUS | Status: DC | PRN
  Administered 2016-06-07: 14:00:00 via INTRAVENOUS

## 2016-06-07 MED ORDER — LORAZEPAM 1 MG PO TABS
2.0000 mg | ORAL_TABLET | Freq: Three times a day (TID) | ORAL | Status: DC | PRN
  Administered 2016-06-07: 2 mg via ORAL
  Filled 2016-06-07: qty 2

## 2016-06-07 MED ORDER — ACETAMINOPHEN 160 MG/5ML PO SOLN: 325.0000 mg | ORAL | Status: DC | PRN

## 2016-06-07 MED ORDER — PROPOFOL 10 MG/ML IV BOLUS
INTRAVENOUS | Status: AC
  Filled 2016-06-07: qty 40

## 2016-06-07 MED ORDER — PROPOFOL 10 MG/ML IV BOLUS
INTRAVENOUS | Status: DC | PRN
  Administered 2016-06-07: 200 mg via INTRAVENOUS

## 2016-06-07 MED ORDER — ACETAMINOPHEN 325 MG PO TABS: 325.0000 mg | ORAL_TABLET | ORAL | Status: DC | PRN

## 2016-06-07 MED ORDER — PHENYLEPHRINE HCL 10 MG/ML IJ SOLN
INTRAMUSCULAR | Status: DC | PRN
  Administered 2016-06-07: 40 ug via INTRAVENOUS
  Administered 2016-06-07: 80 ug via INTRAVENOUS
  Administered 2016-06-07: 120 ug via INTRAVENOUS
  Administered 2016-06-07: 40 ug via INTRAVENOUS
  Administered 2016-06-07: 80 ug via INTRAVENOUS
  Administered 2016-06-07: 40 ug via INTRAVENOUS

## 2016-06-07 MED ORDER — POLYETHYLENE GLYCOL 3350 17 G PO PACK
17.0000 g | PACK | Freq: Two times a day (BID) | ORAL | Status: DC
  Administered 2016-06-07 – 2016-06-10 (×7): 17 g via ORAL
  Filled 2016-06-07 (×7): qty 1

## 2016-06-07 MED ORDER — FENTANYL CITRATE (PF) 250 MCG/5ML IJ SOLN
INTRAMUSCULAR | Status: AC
  Filled 2016-06-07: qty 5

## 2016-06-07 MED ORDER — CHLORHEXIDINE GLUCONATE CLOTH 2 % EX PADS
6.0000 | MEDICATED_PAD | Freq: Every day | CUTANEOUS | Status: DC
  Administered 2016-06-07 – 2016-06-10 (×3): 6 via TOPICAL

## 2016-06-07 MED ORDER — MIDAZOLAM HCL 2 MG/2ML IJ SOLN
INTRAMUSCULAR | Status: AC
  Filled 2016-06-07: qty 2

## 2016-06-07 MED ORDER — HYDROMORPHONE HCL 1 MG/ML IJ SOLN
INTRAMUSCULAR | Status: AC
  Filled 2016-06-07: qty 2

## 2016-06-07 MED ORDER — ONDANSETRON HCL 4 MG/2ML IJ SOLN
INTRAMUSCULAR | Status: DC | PRN
  Administered 2016-06-07: 4 mg via INTRAVENOUS

## 2016-06-07 SURGICAL SUPPLY — 40 items
BAG DECANTER FOR FLEXI CONT (MISCELLANEOUS) ×3 IMPLANT
BANDAGE ACE 4X5 VEL STRL LF (GAUZE/BANDAGES/DRESSINGS) ×3 IMPLANT
BANDAGE ELASTIC 3 VELCRO ST LF (GAUZE/BANDAGES/DRESSINGS) IMPLANT
BANDAGE ELASTIC 4 VELCRO ST LF (GAUZE/BANDAGES/DRESSINGS) IMPLANT
BNDG ESMARK 4X9 LF (GAUZE/BANDAGES/DRESSINGS) ×3 IMPLANT
BNDG GAUZE ELAST 4 BULKY (GAUZE/BANDAGES/DRESSINGS) ×3 IMPLANT
CORDS BIPOLAR (ELECTRODE) ×3 IMPLANT
CUFF TOURNIQUET SINGLE 18IN (TOURNIQUET CUFF) ×3 IMPLANT
DRAPE SURG 17X23 STRL (DRAPES) ×3 IMPLANT
ELECT REM PT RETURN 9FT ADLT (ELECTROSURGICAL)
ELECTRODE REM PT RTRN 9FT ADLT (ELECTROSURGICAL) IMPLANT
GAUZE PACKING IODOFORM 1/4X5 (PACKING) IMPLANT
GAUZE SPONGE 4X4 12PLY STRL (GAUZE/BANDAGES/DRESSINGS) ×3 IMPLANT
GAUZE XEROFORM 1X8 LF (GAUZE/BANDAGES/DRESSINGS) ×3 IMPLANT
GLOVE BIOGEL M 8.0 STRL (GLOVE) ×3 IMPLANT
GOWN STRL REUS W/ TWL LRG LVL3 (GOWN DISPOSABLE) ×2 IMPLANT
GOWN STRL REUS W/TWL LRG LVL3 (GOWN DISPOSABLE) ×4
HANDPIECE INTERPULSE COAX TIP (DISPOSABLE)
KIT BASIN OR (CUSTOM PROCEDURE TRAY) ×3 IMPLANT
KIT ROOM TURNOVER OR (KITS) ×3 IMPLANT
MANIFOLD NEPTUNE II (INSTRUMENTS) ×3 IMPLANT
NEEDLE HYPO 25GX1X1/2 BEV (NEEDLE) IMPLANT
NS IRRIG 1000ML POUR BTL (IV SOLUTION) ×3 IMPLANT
PACK ORTHO EXTREMITY (CUSTOM PROCEDURE TRAY) ×3 IMPLANT
PAD ARMBOARD 7.5X6 YLW CONV (MISCELLANEOUS) ×6 IMPLANT
PAD CAST 4YDX4 CTTN HI CHSV (CAST SUPPLIES) IMPLANT
PADDING CAST COTTON 4X4 STRL (CAST SUPPLIES)
SET HNDPC FAN SPRY TIP SCT (DISPOSABLE) IMPLANT
SOAP 2 % CHG 4 OZ (WOUND CARE) ×3 IMPLANT
SPLINT FIBERGLASS 3X12 (CAST SUPPLIES) ×3 IMPLANT
SPONGE LAP 18X18 X RAY DECT (DISPOSABLE) IMPLANT
SPONGE LAP 4X18 X RAY DECT (DISPOSABLE) ×3 IMPLANT
SYR CONTROL 10ML LL (SYRINGE) IMPLANT
TOWEL OR 17X24 6PK STRL BLUE (TOWEL DISPOSABLE) ×3 IMPLANT
TOWEL OR 17X26 10 PK STRL BLUE (TOWEL DISPOSABLE) ×3 IMPLANT
TUBE ANAEROBIC SPECIMEN COL (MISCELLANEOUS) ×3 IMPLANT
TUBE CONNECTING 12'X1/4 (SUCTIONS) ×1
TUBE CONNECTING 12X1/4 (SUCTIONS) ×2 IMPLANT
WATER STERILE IRR 1000ML POUR (IV SOLUTION) ×3 IMPLANT
YANKAUER SUCT BULB TIP NO VENT (SUCTIONS) ×3 IMPLANT

## 2016-06-07 NOTE — H&P (Signed)
Reason for Consult:? Wrist infection Referring Physician: ER/Hospitalist  CC:My hand hurts  HPI:  Ronnie Moore is an 55 y.o. right handed male who presents with   apin and swelling to R wrist for a couple of days, history of a recent fall, but no fx/dislocation per xray, increased sed rate, erythema, pain with rom of wrist.     .   Pain is rated at   7 /10 and is described as sharp/dull.  Pain is constant.  Pain is made better by rest/immobilization, worse with motion.   Associated signs/symptoms: no h/o gout, no fx, old scaphoid injury R Previous treatment:    Past Medical History  Diagnosis Date  . Chronic back pain   . Stroke ([facility])   . Hypertension     Past Surgical History  Procedure Laterality Date  . Back surgery      Family History  Problem Relation Age of Onset  . Arthritis Mother   . Hypertension Father   . Heart disease Father   . Diabetes Mellitus II Sister     Social History:  reports that he has been smoking.  He does not have any smokeless tobacco history on file. He reports that he drinks alcohol. He reports that he does not use illicit drugs.  Allergies:  Allergies  Allergen Reactions  . Metaxalone Swelling    Reaction to Skelaxin - lips swelled    Medications: I have reviewed the patient's current medications.  Results for orders placed or performed during the hospital encounter of 06/06/16 (from the past 48 hour(s))  CBC with Differential/Platelet     Status: None   Collection Time: 06/06/16  4:20 PM  Result Value Ref Range   WBC 8.8 4.0 - 10.5 K/uL   RBC 4.75 4.22 - 5.81 MIL/uL   Hemoglobin 14.7 13.0 - 17.0 g/dL   HCT 63.1 80.8 - 13.8 %   MCV 89.1 78.0 - 100.0 fL   MCH 30.9 26.0 - 34.0 pg   MCHC 34.8 30.0 - 36.0 g/dL   RDW 40.2 01.1 - 46.6 %   Platelets 244 150 - 400 K/uL   Neutrophils Relative % 65 %   Neutro Abs 5.8 1.7 - 7.7 K/uL   Lymphocytes Relative 23 %   Lymphs Abs 2.0 0.7 - 4.0 K/uL   Monocytes Relative 10 %   Monocytes Absolute  0.9 0.1 - 1.0 K/uL   Eosinophils Relative 2 %   Eosinophils Absolute 0.1 0.0 - 0.7 K/uL   Basophils Relative 0 %   Basophils Absolute 0.0 0.0 - 0.1 K/uL  Comprehensive metabolic panel     Status: Abnormal   Collection Time: 06/06/16  4:20 PM  Result Value Ref Range   Sodium 136 135 - 145 mmol/L   Potassium 3.3 (L) 3.5 - 5.1 mmol/L   Chloride 102 101 - 111 mmol/L   CO2 26 22 - 32 mmol/L   Glucose, Bld 100 (H) 65 - 99 mg/dL   BUN 10 6 - 20 mg/dL   Creatinine, Ser 1.98 0.61 - 1.24 mg/dL   Calcium 9.0 8.9 - 20.4 mg/dL   Total Protein 7.9 6.5 - 8.1 g/dL   Albumin 3.6 3.5 - 5.0 g/dL   AST 56 (H) 15 - 41 U/L   ALT 71 (H) 17 - 63 U/L   Alkaline Phosphatase 56 38 - 126 U/L   Total Bilirubin 1.0 0.3 - 1.2 mg/dL   GFR calc non Af Amer >60 >60 mL/min   GFR calc  Af Amer >60 >60 mL/min    Comment: (NOTE) The eGFR has been calculated using the CKD EPI equation. This calculation has not been validated in all clinical situations. eGFR's persistently <60 mL/min signify possible Chronic Kidney Disease.    Anion gap 8 5 - 15  Uric acid     Status: None   Collection Time: 06/06/16  4:20 PM  Result Value Ref Range   Uric Acid, Serum 6.1 4.4 - 7.6 mg/dL  Sedimentation rate     Status: Abnormal   Collection Time: 06/06/16  4:20 PM  Result Value Ref Range   Sed Rate 40 (H) 0 - 16 mm/hr  C-reactive protein     Status: Abnormal   Collection Time: 06/06/16  4:20 PM  Result Value Ref Range   CRP 10.7 (H) <1.0 mg/dL    Comment: Performed at [facility]  CBC     Status: None   Collection Time: 06/07/16 12:00 AM  Result Value Ref Range   WBC 8.3 4.0 - 10.5 K/uL   RBC 4.70 4.22 - 5.81 MIL/uL   Hemoglobin 14.1 13.0 - 17.0 g/dL   HCT 42.8 39.0 - 52.0 %   MCV 91.1 78.0 - 100.0 fL   MCH 30.0 26.0 - 34.0 pg   MCHC 32.9 30.0 - 36.0 g/dL   RDW 13.0 11.5 - 15.5 %   Platelets 211 150 - 400 K/uL  Creatinine, serum     Status: None   Collection Time: 06/07/16 12:00 AM  Result Value Ref Range    Creatinine, Ser 0.90 0.61 - 1.24 mg/dL   GFR calc non Af Amer >60 >60 mL/min   GFR calc Af Amer >60 >60 mL/min    Comment: (NOTE) The eGFR has been calculated using the CKD EPI equation. This calculation has not been validated in all clinical situations. eGFR's persistently <60 mL/min signify possible Chronic Kidney Disease.   MRSA PCR Screening     Status: Abnormal   Collection Time: 06/07/16 12:20 AM  Result Value Ref Range   MRSA by PCR POSITIVE (A) NEGATIVE    Comment:        The GeneXpert MRSA Assay (FDA approved for NASAL specimens only), is one component of a comprehensive MRSA colonization surveillance program. It is not intended to diagnose MRSA infection nor to guide or monitor treatment for MRSA infections. RESULT CALLED TO, READ BACK BY AND VERIFIED WITH: WHITEHORN,S RN [phone] AT 0228 SKEEN,P   Comprehensive metabolic panel     Status: Abnormal   Collection Time: 06/07/16  2:48 AM  Result Value Ref Range   Sodium 141 135 - 145 mmol/L   Potassium 3.7 3.5 - 5.1 mmol/L   Chloride 98 (L) 101 - 111 mmol/L   CO2 25 22 - 32 mmol/L   Glucose, Bld 117 (H) 65 - 99 mg/dL   BUN 11 6 - 20 mg/dL   Creatinine, Ser 0.93 0.61 - 1.24 mg/dL   Calcium 9.9 8.9 - 10.3 mg/dL   Total Protein 6.8 6.5 - 8.1 g/dL   Albumin 3.0 (L) 3.5 - 5.0 g/dL   AST 44 (H) 15 - 41 U/L   ALT 61 17 - 63 U/L   Alkaline Phosphatase 56 38 - 126 U/L   Total Bilirubin 0.6 0.3 - 1.2 mg/dL   GFR calc non Af Amer >60 >60 mL/min   GFR calc Af Amer >60 >60 mL/min    Comment: (NOTE) The eGFR has been calculated using the CKD EPI equation. This  calculation has not been validated in all clinical situations. eGFR's persistently <60 mL/min signify possible Chronic Kidney Disease.    Anion gap 18 (H) 5 - 15  CBC WITH DIFFERENTIAL     Status: None   Collection Time: 06/07/16  2:48 AM  Result Value Ref Range   WBC 6.9 4.0 - 10.5 K/uL   RBC 4.48 4.22 - 5.81 MIL/uL   Hemoglobin 13.4 13.0 - 17.0 g/dL   HCT 40.9  39.0 - 52.0 %   MCV 91.3 78.0 - 100.0 fL   MCH 29.9 26.0 - 34.0 pg   MCHC 32.8 30.0 - 36.0 g/dL   RDW 13.1 11.5 - 15.5 %   Platelets 252 150 - 400 K/uL   Neutrophils Relative % 56 %   Neutro Abs 3.9 1.7 - 7.7 K/uL   Lymphocytes Relative 29 %   Lymphs Abs 2.0 0.7 - 4.0 K/uL   Monocytes Relative 12 %   Monocytes Absolute 0.8 0.1 - 1.0 K/uL   Eosinophils Relative 3 %   Eosinophils Absolute 0.2 0.0 - 0.7 K/uL   Basophils Relative 0 %   Basophils Absolute 0.0 0.0 - 0.1 K/uL    Dg Wrist Complete Right  06/06/2016  CLINICAL DATA:  Status post fall, with right wrist pain. Initial encounter. EXAM: RIGHT WRIST - COMPLETE 3+ VIEW COMPARISON:  Right forearm radiographs from 06/19/2013 FINDINGS: There is no evidence of acute fracture or dislocation. There appears to be mild chronic deformity involving the distal scaphoid, possibly reflecting remote injury. The carpal rows are intact, and demonstrate normal alignment. The joint spaces are preserved. Diffuse dorsal soft tissue swelling is noted along the wrist and hand. IMPRESSION: 1. No evidence of fracture or dislocation. 2. Apparent mild chronic deformity involving the distal scaphoid. 3. Diffuse dorsal soft tissue swelling noted along the wrist and hand. Electronically Signed   By: Garald Balding M.D.   On: 06/06/2016 16:25    Pertinent items are noted in HPI. Temp:  [97.9 F (36.6 C)-99.3 F (37.4 C)] 97.9 F (36.6 C) (07/03 0416) Pulse Rate:  [79-106] 79 (07/03 0416) Resp:  [18-20] 18 (07/03 0416) BP: (119-161)/(66-81) 129/77 mmHg (07/03 0416) SpO2:  [95 %-100 %] 98 % (07/03 0416) Weight:  [113.3 kg (249 lb 12.5 oz)-113.399 kg (250 lb)] 113.3 kg (249 lb 12.5 oz) (07/02 2058) General appearance: alert and cooperative Resp: clear to auscultation bilaterally Cardio: regular rate and rhythm Extremities: extremities normal, atraumatic, no cyanosis or edema - L, RUE with dorsal hand and distal forearm swelling, erythema, warmth, pain with motion  of wrist, no obvious lacerations, puncture sources   Assessment: ? Septic wrist, vs gout ( normal uric acid) Plan: Will aspirate wrist wash out I have discussed this treatment plan in detail with patient , including the risks of the recommended treatment or surgery, the benefits and the alternatives.  The patient understands that additional treatment may be necessary.  Aaryn Parrilla CHRISTOPHER 06/07/2016, 1:49 PM

## 2016-06-07 NOTE — Progress Notes (Signed)
Patient is a very high risk for falls however he insists on getting up out of bed, bending over, fidgety, and being non-compliant with hospital policies in regards to safety.  Patient has been informed , educated and, re-educated in regards to safety  ie (refusing bed alarm) and he is adamant on doing things his way; getting up with out assistance.

## 2016-06-07 NOTE — Anesthesia Preprocedure Evaluation (Signed)
Anesthesia Evaluation  Patient identified by MRN, date of birth, ID band Patient awake    Reviewed: Allergy & Precautions, NPO status , Patient's Chart, lab work & pertinent test results  History of Anesthesia Complications Negative for: history of anesthetic complications  Airway Mallampati: III  TM Distance: >3 FB Neck ROM: Full    Dental  (+) Teeth Intact, Caps   Pulmonary Current Smoker,    breath sounds clear to auscultation       Cardiovascular hypertension, Pt. on medications (-) angina(-) Past MI and (-) CHF  Rhythm:Regular     Neuro/Psych CVA negative neurological ROS     GI/Hepatic negative GI ROS, Neg liver ROS,   Endo/Other  Morbid obesity  Renal/GU negative Renal ROS     Musculoskeletal negative musculoskeletal ROS (+)   Abdominal   Peds  Hematology   Anesthesia Other Findings   Reproductive/Obstetrics                             Anesthesia Physical Anesthesia Plan  ASA: III  Anesthesia Plan: General   Post-op Pain Management:    Induction: Intravenous and Rapid sequence  Airway Management Planned: Oral ETT  Additional Equipment: None  Intra-op Plan:   Post-operative Plan: Extubation in OR  Informed Consent: I have reviewed the patients History and Physical, chart, labs and discussed the procedure including the risks, benefits and alternatives for the proposed anesthesia with the patient or authorized representative who has indicated his/her understanding and acceptance.   Dental advisory given  Plan Discussed with: CRNA and Surgeon  Anesthesia Plan Comments:         Anesthesia Quick Evaluation

## 2016-06-07 NOTE — Anesthesia Postprocedure Evaluation (Signed)
Anesthesia Post Note  Patient: Ronnie Moore  Procedure(s) Performed: Procedure(s) (LRB): IRRIGATION AND DEBRIDEMENT WRIST (Right)  Patient location during evaluation: PACU Anesthesia Type: General Level of consciousness: awake Pain management: pain level controlled Vital Signs Assessment: post-procedure vital signs reviewed and stable Respiratory status: spontaneous breathing Cardiovascular status: stable Postop Assessment: no signs of nausea or vomiting Anesthetic complications: no    Last Vitals:  Filed Vitals:   06/07/16 1610 06/07/16 1624  BP:  137/93  Pulse: 69 70  Temp: 36.1 C 36.1 C  Resp: 16 16    Last Pain:  Filed Vitals:   06/07/16 1624  PainSc: 0-No pain                 Zackarey Holleman

## 2016-06-07 NOTE — Progress Notes (Addendum)
[facility] PROGRESS NOTE  Ronnie Moore [id number] DOB: 1961/05/08 DOA: 06/06/2016   PCP: No primary care provider on file.     Assessment/Plan: Principal Problem:   Cellulitis of right upper extremity Active Problems:   Hypokalemia   Essential hypertension   Abnormal LFTs   Chronic back pain    55 y.o. male with medical history significant of chronic back pain, CVA, hypertension, osteoarthritis, previous MRSA infection, obesity who was transferred from [facility] due to right wrist and hand pain after falling and injuring area a week ago and low-grade fever for the past 2 days. The patient states that he has post CVA chronic circulation issues of the right upper extremity.  ED Course: Workup revealed hypokalemia of 3.3 mmol per liter, elevated CRP and sedimentation rate and right wrist radiograph showing soft tissue swelling of the wrist and hand. The patient received IV antibiotics and analgesics. He was transferred to [facility] for further treatment and evaluation.  Assessment and plan Cellulitis of right upper extremity Continue MedSurg Continue scheduled and when necessary analgesics. Dose limited hydromorphone 2 mg IVP 4 Toradol 30 mg IVP 1. Pantoprazole 40 mg by mouth daily for GI prophylaxis Continue IV antibiotics. Vancomycin, Zosyn Suspect patient may need  to be r/o for septic arthritis of the wrist    ,    EDP did notify Dr. Lenon Curt who was on call yesterday, I have placed a call to his office , may need a joint washout  X-ray shows diffuse swelling Will obtain MRI of the right forearm and hand Suspect patient has right upper extremity swelling both secondary to cellulitis as well as his prior CVA Will keep patient NPO, hold Lovenox in anticipation of surgery today   Hypokalemia Likely due to the use of hydrochlorothiazide without potassium supplementation. Potassium supplementation added. Discontinue HCTZ   Essential  hypertension Continue Avapro 300 mg by mouth daily. Continue amlodipine 10 mg by mouth daily. Monitor blood pressure.   Abnormal LFTs States he only drinks alcohol in small amounts very sporadically. History of hepatitis B per patient. He believes it is non-active hep B. Check acute hepatitis panel. Follow-up LFTs. Check UDS, check for HIV   Chronic back pain Continue long acting morphine 15 mg by mouth twice a day. Continue oxycodone 20 mg by mouth every 4 hours when necessary. Continue lorazepam 2 mg by mouth 3 times a day when necessary as needed for back spasms.     DVT prophylaxsis Lovenox  Code Status:    Full code   Family Communication: Discussed in detail with the patient, all imaging results, lab results explained to the patient   Disposition Plan:  Further hand surgery recommendations pending     Consultants:  Hand surgery  Procedures:  None  Antibiotics: Anti-infectives    Start     Dose/Rate Route Frequency Ordered Stop   06/07/16 0600  piperacillin-tazobactam (ZOSYN) IVPB 3.375 g     3.375 g 12.5 mL/hr over 240 Minutes Intravenous Every 8 hours 06/06/16 2236     06/06/16 2300  vancomycin (VANCOCIN) IVPB 1000 mg/200 mL premix     1,000 mg 200 mL/hr over 60 Minutes Intravenous Every 8 hours 06/06/16 2236        HPI/Subjective: Complains of being constipated and requesting a laxative, also states that his right wrist is painful  Objective: Filed Vitals:   06/06/16 1946 06/06/16 1950 06/06/16 2058 06/07/16 0416  BP: 119/79 119/79 161/66  129/77  Pulse: 101 103 102 79  Temp:   98.8 F (37.1 C) 97.9 F (36.6 C)  TempSrc:   Oral Oral  Resp:  18 18 18   Height:   5\' 8"  (1.727 m)   Weight:   113.3 kg (249 lb 12.5 oz)   SpO2: 95% 99% 95% 98%    Intake/Output Summary (Last 24 hours) at 06/07/16 1032 Last data filed at 06/07/16 0830  Gross per 24 hour  Intake    440 ml  Output      0 ml  Net    440 ml    Exam:  Examination:  General  exam: Appears calm and comfortable  Respiratory system: Clear to auscultation. Respiratory effort normal. Cardiovascular system: S1 & S2 heard, RRR. No JVD, murmurs, rubs, gallops or clicks. No pedal edema. Gastrointestinal system: Abdomen is nondistended, soft and nontender. No organomegaly or masses felt. Normal bowel sounds heard. Central nervous system: Alert and oriented. No focal neurological deficits. Extremities: Symmetric 5 x 5 power.2+ pitting edema to the dorsal surface of the right hand, wrist and distal forearm. There is erythema and tenderness to palpation. Warmth is appreciated. 2+ radial pulses bilaterally Skin: No rashes, lesions or ulcers Psychiatry: Judgement and insight appear normal. Mood & affect appropriate.     Data Reviewed: I have personally reviewed following labs and imaging studies  Micro Results Recent Results (from the past 240 hour(s))  MRSA PCR Screening     Status: Abnormal   Collection Time: 06/07/16 12:20 AM  Result Value Ref Range Status   MRSA by PCR POSITIVE (A) NEGATIVE Final    Comment:        The GeneXpert MRSA Assay (FDA approved for NASAL specimens only), is one component of a comprehensive MRSA colonization surveillance program. It is not intended to diagnose MRSA infection nor to guide or monitor treatment for MRSA infections. RESULT CALLED TO, READ BACK BY AND VERIFIED WITH: WHITEHORN,S RN [id number] AT 0228 [facility]     Radiology Reports Dg Wrist Complete Right  06/06/2016  CLINICAL DATA:  Status post fall, with right wrist pain. Initial encounter. EXAM: RIGHT WRIST - COMPLETE 3+ VIEW COMPARISON:  Right forearm radiographs from 06/19/2013 FINDINGS: There is no evidence of acute fracture or dislocation. There appears to be mild chronic deformity involving the distal scaphoid, possibly reflecting remote injury. The carpal rows are intact, and demonstrate normal alignment. The joint spaces are preserved. Diffuse dorsal soft tissue swelling is  noted along the wrist and hand. IMPRESSION: 1. No evidence of fracture or dislocation. 2. Apparent mild chronic deformity involving the distal scaphoid. 3. Diffuse dorsal soft tissue swelling noted along the wrist and hand. Electronically Signed   By: Garald Balding M.D.   On: 06/06/2016 16:25     CBC  Recent Labs Lab 06/06/16 1620 06/07/16 06/07/16 0248  WBC 8.8 8.3 6.9  HGB 14.7 14.1 13.4  HCT 42.3 42.8 40.9  PLT 244 211 252  MCV 89.1 91.1 91.3  MCH 30.9 30.0 29.9  MCHC 34.8 32.9 32.8  RDW 13.0 13.0 13.1  LYMPHSABS 2.0  --  2.0  MONOABS 0.9  --  0.8  EOSABS 0.1  --  0.2  BASOSABS 0.0  --  0.0    Chemistries   Recent Labs Lab 06/06/16 1620 06/07/16 06/07/16 0248  NA 136  --  141  K 3.3*  --  3.7  CL 102  --  98*  CO2 26  --  25  GLUCOSE 100*  --  117*  BUN 10  --  11  CREATININE 0.94 0.90 0.93  CALCIUM 9.0  --  9.9  AST 56*  --  44*  ALT 71*  --  61  ALKPHOS 56  --  56  BILITOT 1.0  --  0.6   ------------------------------------------------------------------------------------------------------------------ estimated creatinine clearance is 112.3 mL/min (by C-G formula based on Cr of 0.93). ------------------------------------------------------------------------------------------------------------------ No results for input(s): HGBA1C in the last 72 hours. ------------------------------------------------------------------------------------------------------------------ No results for input(s): CHOL, HDL, LDLCALC, TRIG, CHOLHDL, LDLDIRECT in the last 72 hours. ------------------------------------------------------------------------------------------------------------------ No results for input(s): TSH, T4TOTAL, T3FREE, THYROIDAB in the last 72 hours.  Invalid input(s): FREET3 ------------------------------------------------------------------------------------------------------------------ No results for input(s): VITAMINB12, FOLATE, FERRITIN, TIBC, IRON, RETICCTPCT  in the last 72 hours.  Coagulation profile No results for input(s): INR, PROTIME in the last 168 hours.  No results for input(s): DDIMER in the last 72 hours.  Cardiac Enzymes No results for input(s): CKMB, TROPONINI, MYOGLOBIN in the last 168 hours.  Invalid input(s): CK ------------------------------------------------------------------------------------------------------------------ Invalid input(s): POCBNP   CBG: No results for input(s): GLUCAP in the last 168 hours.     Studies: Dg Wrist Complete Right  06/06/2016  CLINICAL DATA:  Status post fall, with right wrist pain. Initial encounter. EXAM: RIGHT WRIST - COMPLETE 3+ VIEW COMPARISON:  Right forearm radiographs from 06/19/2013 FINDINGS: There is no evidence of acute fracture or dislocation. There appears to be mild chronic deformity involving the distal scaphoid, possibly reflecting remote injury. The carpal rows are intact, and demonstrate normal alignment. The joint spaces are preserved. Diffuse dorsal soft tissue swelling is noted along the wrist and hand. IMPRESSION: 1. No evidence of fracture or dislocation. 2. Apparent mild chronic deformity involving the distal scaphoid. 3. Diffuse dorsal soft tissue swelling noted along the wrist and hand. Electronically Signed   By: Garald Balding M.D.   On: 06/06/2016 16:25      No results found for: HGBA1C Lab Results  Component Value Date   CREATININE 0.93 06/07/2016       Scheduled Meds: . irbesartan  300 mg Oral Daily   And  . amLODipine  10 mg Oral Daily   And  . hydrochlorothiazide  25 mg Oral Daily  . Chlorhexidine Gluconate Cloth  6 each Topical Q0600  . enoxaparin (LOVENOX) injection  40 mg Subcutaneous Q24H  . ibuprofen  800 mg Oral TID  . morphine  15 mg Oral BID  . mupirocin ointment  1 application Nasal BID  . nicotine  21 mg Transdermal Daily  . oxyCODONE  20 mg Oral Q4H  . pantoprazole  40 mg Oral Daily  . piperacillin-tazobactam (ZOSYN)  IV  3.375 g  Intravenous Q8H  . potassium chloride  40 mEq Oral Daily  . vancomycin  1,000 mg Intravenous Q8H   Continuous Infusions:    LOS: 1 day    Time spent: >30 MINS    [facility]  [facility] Pager [phone]. If 7PM-7AM, please contact night-coverage at [email or web address], password [facility] 06/07/2016, 10:32 AM  LOS: 1 day

## 2016-06-07 NOTE — Anesthesia Procedure Notes (Signed)
Procedure Name: Intubation Date/Time: 06/07/2016 2:41 PM Performed by: Merdis Delay Pre-anesthesia Checklist: Patient identified, Emergency Drugs available, Suction available, Patient being monitored and Timeout performed Patient Re-evaluated:Patient Re-evaluated prior to inductionOxygen Delivery Method: Circle system utilized Preoxygenation: Pre-oxygenation with 100% oxygen Intubation Type: IV induction and Rapid sequence Laryngoscope Size: Glidescope and 4 Tube type: Oral Tube size: 7.5 mm Number of attempts: 1 Airway Equipment and Method: Stylet Placement Confirmation: positive ETCO2,  CO2 detector,  breath sounds checked- equal and bilateral and ETT inserted through vocal cords under direct vision Secured at: 22 cm Tube secured with: Tape Dental Injury: Teeth and Oropharynx as per pre-operative assessment  Comments: Elective glidescope- fragile front upper teeth hardware

## 2016-06-07 NOTE — Progress Notes (Signed)
Patient education provided patient related to infection control including intravenous antibiotics post I&D of right arm cellulitis.  Also, need for contact isolation for positive MRSA results.  Patient also educated about impact of chronic narcotic use related to constipation.  Patient reports inability to have bowel movement for past 3 days.  Colace and Miralax administered as ordered.  Ambulation and hydration encouraged.  Per patient request, sister "NeNe" participated via speaker phone.

## 2016-06-07 NOTE — Progress Notes (Signed)
I&D performed, no fluid in writs joint; turbid fluid (no pus) in extensor tendon sheath, fluid sent for GStain and culture; ?infectious vs inflammatory tenosynovitis.  Will follow cultures.  Cont BS abx for now.

## 2016-06-07 NOTE — Transfer of Care (Signed)
Immediate Anesthesia Transfer of Care Note  Patient: Ronnie Moore  Procedure(s) Performed: Procedure(s): IRRIGATION AND DEBRIDEMENT WRIST (Right)  Patient Location: PACU  Anesthesia Type:General  Level of Consciousness: awake, alert  and oriented  Airway & Oxygen Therapy: Patient Spontanous Breathing  Post-op Assessment: Report given to RN and Post -op Vital signs reviewed and stable  Post vital signs: Reviewed and stable  Last Vitals:  Filed Vitals:   06/06/16 2058 06/07/16 0416  BP: 161/66 129/77  Pulse: 102 79  Temp: 37.1 C 36.6 C  Resp: 18 18    Last Pain:  Filed Vitals:   06/07/16 1106  PainSc: 8       Patients Stated Pain Goal: 3 ([id number])  Complications: No apparent anesthesia complications

## 2016-06-08 ENCOUNTER — Inpatient Hospital Stay (HOSPITAL_COMMUNITY): Payer: Medicare Other

## 2016-06-08 LAB — COMPREHENSIVE METABOLIC PANEL
ALT: 58 U/L (ref 17–63)
AST: 44 U/L — AB (ref 15–41)
Albumin: 3.2 g/dL — ABNORMAL LOW (ref 3.5–5.0)
Alkaline Phosphatase: 56 U/L (ref 38–126)
Anion gap: 7 (ref 5–15)
BILIRUBIN TOTAL: 0.6 mg/dL (ref 0.3–1.2)
BUN: 8 mg/dL (ref 6–20)
CO2: 22 mmol/L (ref 22–32)
CREATININE: 0.88 mg/dL (ref 0.61–1.24)
Calcium: 8.6 mg/dL — ABNORMAL LOW (ref 8.9–10.3)
Chloride: 106 mmol/L (ref 101–111)
GFR calc Af Amer: >60 mL/min (ref >60)
Glucose, Bld: 103 mg/dL — ABNORMAL HIGH (ref 65–99)
POTASSIUM: 3.7 mmol/L (ref 3.5–5.1)
Sodium: 135 mmol/L (ref 135–145)
TOTAL PROTEIN: 7.1 g/dL (ref 6.5–8.1)

## 2016-06-08 LAB — CBC WITH DIFFERENTIAL/PLATELET
BASOS ABS: 0 10*3/uL (ref 0.0–0.1)
Basophils Relative: 0 %
Eosinophils Absolute: 0.2 10*3/uL (ref 0.0–0.7)
Eosinophils Relative: 4 %
HEMATOCRIT: 41.8 % (ref 39.0–52.0)
Hemoglobin: 14 g/dL (ref 13.0–17.0)
LYMPHS ABS: 1.6 10*3/uL (ref 0.7–4.0)
LYMPHS PCT: 29 %
MCH: 30.4 pg (ref 26.0–34.0)
MCHC: 33.5 g/dL (ref 30.0–36.0)
MCV: 90.9 fL (ref 78.0–100.0)
MONO ABS: 0.4 10*3/uL (ref 0.1–1.0)
MONOS PCT: 7 %
NEUTROS ABS: 3.3 10*3/uL (ref 1.7–7.7)
Neutrophils Relative %: 60 %
Platelets: 260 10*3/uL (ref 150–400)
RBC: 4.6 MIL/uL (ref 4.22–5.81)
RDW: 13.1 % (ref 11.5–15.5)
WBC: 5.5 10*3/uL (ref 4.0–10.5)

## 2016-06-08 LAB — RAPID URINE DRUG SCREEN, HOSP PERFORMED
Amphetamines: NOT DETECTED
BENZODIAZEPINES: NOT DETECTED
Barbiturates: NOT DETECTED
COCAINE: NOT DETECTED
OPIATES: POSITIVE — AB
Tetrahydrocannabinol: NOT DETECTED

## 2016-06-08 LAB — HEPATITIS PANEL, ACUTE
HCV Ab: >11 s/co ratio — ABNORMAL HIGH (ref 0.0–0.9)
HEP B C IGM: NEGATIVE
Hep A IgM: NEGATIVE
Hepatitis B Surface Ag: NEGATIVE

## 2016-06-08 LAB — HIV ANTIBODY (ROUTINE TESTING W REFLEX): HIV SCREEN 4TH GENERATION: NONREACTIVE

## 2016-06-08 MED ORDER — LORAZEPAM 0.5 MG PO TABS: 0.5000 mg | ORAL_TABLET | Freq: Four times a day (QID) | ORAL | Status: DC | PRN

## 2016-06-08 MED ORDER — OXYCODONE HCL 5 MG PO TABS
10.0000 mg | ORAL_TABLET | ORAL | Status: DC
  Administered 2016-06-08 – 2016-06-09 (×5): 10 mg via ORAL
  Filled 2016-06-08 (×5): qty 2

## 2016-06-08 MED ORDER — HYDROMORPHONE HCL 1 MG/ML IJ SOLN
1.0000 mg | INTRAMUSCULAR | Status: DC | PRN
  Administered 2016-06-08 (×2): 1 mg via INTRAVENOUS
  Filled 2016-06-08 (×2): qty 1

## 2016-06-08 MED ORDER — NALOXONE HCL 0.4 MG/ML IJ SOLN
INTRAMUSCULAR | Status: AC
  Filled 2016-06-08: qty 1

## 2016-06-08 MED ORDER — NALOXONE HCL 0.4 MG/ML IJ SOLN
0.4000 mg | INTRAMUSCULAR | Status: DC | PRN
  Administered 2016-06-08: 0.4 mg via INTRAVENOUS

## 2016-06-08 NOTE — Progress Notes (Signed)
Walked by patient's room. Found him on the floor.  Asked what he was trying to do, he stated, get up to go to the bathroom. Stated did not hit his head, just his bottom.  Patient was very drowsy.  Vital Signs are stable.  Informed Dr. Allyson Sabal. She placed orders.

## 2016-06-08 NOTE — Op Note (Signed)
NAMECRHISTOPHER, Ronnie NO.:  [id number]  MEDICAL RECORD NO.:  [id number]  LOCATION:  [id number]                        FACILITY:  [facility]  PHYSICIAN:  Dennie Bible, MD    DATE OF BIRTH:  [birth year]  DATE OF PROCEDURE:  06/07/2016 DATE OF DISCHARGE:                              OPERATIVE REPORT   PREOPERATIVE DIAGNOSIS:  Infection of the right wrist, presumed septic joint.  POSTOPERATIVE DIAGNOSIS:  Infection of the right wrist, presumed septic joint.  PROCEDURE: 1. Aspiration of right wrist joint. 2. Open drainage incision and drainage of the right wrist extensor     tendon sheath.  ANESTHESIA:  General.  CULTURES:  Taken.  SPECIMENS:  None.  ESTIMATED BLOOD LOSS:  Minimal.  COMPLICATIONS:  No acute complications.  INDICATIONS:  Mr. Kromer is a 55 year old gentleman who presented to the emergency room with pain and swelling of the right hand and wrist.  He was admitted to the hospital, and I was consulted for presumed septic right wrist.  Risks, benefits, and alternatives of the wrist aspiration and I and D were explained to the patient.  Consent was obtained.  PROCEDURE IN DETAIL:  The patient was taken the operating room, placed supine on the operating table.  General anesthesia was administered without difficulty.  Time-out was performed identifying the correct extremity.  The right upper extremity was prepped and draped in normal sterile fashion.  A 22-gauge Angiocath was used on the dorsal wrist to gain access to the wrist joint.  This was performed.  There was no fluid that was obtained upon aspiration.  After the needle was removed, there appeared to be some turbid type fluid coming from the soft tissues. Decision was made to open up the subcutaneous tissues and extensor tendon sheath to see if there was infection here.  An Esmarch was used around the forearm for tourniquet.  A longitudinal incision along the dorsal forearm and proximal wrist  was then made.  Dissection carried down to the subcutaneous tissue.  Once the extensor tendon sheath was opened, in the extensor retinaculum there was some turbid type fluid. There was no gross pus, however, just turbid synovial type fluid. Unroofing of the tendon sheath, the EPL as well as the extensor tendon was then performed.  The wrist was milked and thoroughly irrigated with irrigation solution.  There did not appear to be any more of this turbid fluid.  Afterwards hemostasis was obtained after release of the Esmarch forearm tourniquet.  The wound was very loosely approximated with 4-0 nylon.  Small Xeroform strips were placed interposed between the sutures for adequate drainage.  A sterile dressing and a resting splint were placed.  Marcaine was instilled around the incision site for postoperative pain control.  Cultures will be evaluated to see if this is infectious or inflammatory in nature.     Dennie Bible, MD     HCC/MEDQ  D:  06/07/2016  T:  06/07/2016  Job:  [id number]

## 2016-06-08 NOTE — Progress Notes (Signed)
Comments:  Notified by RN that pt had an unwitnessed fall in room. Dr Allyson Sabal was notified was and ordered ct scans of his hips/pelvis. RR RN has requested RN call and request head ct as well since fall unwitnessed. RN reports to me that pt is oriented and therefore a reliable historian who denies hitting his head during the fall stating he fell on his "bottom".. RN admits there are no signs of trauma to pt's head or face. Given this information do not feel Ct head indicated. Will place orders for neuro checks.   Jeryl Columbia, NP-C [facility] Pager [phone]

## 2016-06-08 NOTE — Progress Notes (Signed)
[facility] PROGRESS NOTE  Ronnie Moore [id number] DOB: 1961/07/21 DOA: 06/06/2016   PCP: No primary care provider on file.     Assessment/Plan: Principal Problem:   Cellulitis of right upper extremity Active Problems:   Hypokalemia   Essential hypertension   Abnormal LFTs   Chronic back pain    55 y.o. male with medical history significant of chronic back pain, CVA, hypertension, osteoarthritis, previous MRSA infection, obesity who was transferred from [facility] due to right wrist and hand pain after falling and injuring area a week ago and low-grade fever for the past 2 days. The patient states that he has post CVA chronic circulation issues of the right upper extremity.  ED Course: Workup revealed hypokalemia of 3.3 mmol per liter, elevated CRP and sedimentation rate and right wrist radiograph showing soft tissue swelling of the wrist and hand. The patient received IV antibiotics and analgesics. He was transferred to [facility] for further treatment and evaluation.  Assessment and plan Cellulitis of right upper extremity Continue MedSurg Continue scheduled and when necessary analgesics. Minimize IV Dilaudid Pantoprazole 40 mg by mouth daily for GI prophylaxis Continue IV antibiotics. Vancomycin, Zosyn , pending culture results and Gram stain results from   Aspiration of the right wrist,  appreciate  Dr. Lenon Curt  Input  , suspect infectious versus inflammatory tenosynovitis  X-ray shows diffuse swelling Suspect patient has right upper extremity swelling both secondary to cellulitis as well as his prior CVA Follow culture data and narrow antibiotics accordingly Uric acid 6.1    Hypokalemia -Repleted  Likely due to the use of hydrochlorothiazide without potassium supplementation. Potassium supplementation added. Discontinue HCTZ   Essential hypertension Continue Avapro 300 mg by mouth daily. Continue amlodipine 10 mg by mouth  daily. Monitor blood pressure.   Abnormal LFTs , Now improving  States he only drinks alcohol in small amounts very sporadically. History of hepatitis B per patient. He believes it is non-active hep B. Hepatitis C antibody positive HIV nonreactive   Chronic back pain Continue long acting morphine 15 mg by mouth twice a day. Continue oxycodone 20 reduce to 10 mg mg by mouth every 4 hours when necessary. reduce  Lorazepam from  2 mg to 0.5 mg by mouth 3 times a day when necessary as needed for back spasms.due to oversedation Pt also given one dose of narcan due to oversedation      DVT prophylaxsis Lovenox  Code Status:    Full code   Family Communication: Discussed in detail with the patient, all imaging results, lab results explained to the patient   Disposition Plan: PT evaluation, anticipate discharge in one to 2 days     Consultants:  Hand surgery  Procedures: PROCEDURE: 1. Aspiration of right wrist joint. 2. Open drainage incision and drainage of the right wrist extensor  tendon sheath.  Antibiotics: Anti-infectives    Start     Dose/Rate Route Frequency Ordered Stop   06/07/16 0600  piperacillin-tazobactam (ZOSYN) IVPB 3.375 g     3.375 g 12.5 mL/hr over 240 Minutes Intravenous Every 8 hours 06/06/16 2236     06/06/16 2300  vancomycin (VANCOCIN) IVPB 1000 mg/200 mL premix     1,000 mg 200 mL/hr over 60 Minutes Intravenous Every 8 hours 06/06/16 2236        HPI/Subjective:  patient noncomplaint with nursing recommendations , trying to get out of bed , refusing bed alarms   Objective: Filed Vitals:  06/07/16 1610 06/07/16 1624 06/07/16 2054 06/08/16 0325  BP:  137/93 119/67 129/78  Pulse: 69 70 83 86  Temp: 97 F (36.1 C) 97 F (36.1 C) 98.2 F (36.8 C) 99.1 F (37.3 C)  TempSrc:  Oral Oral Oral  Resp: 16 16    Height:      Weight:      SpO2: 96% 98% 95% 99%    Intake/Output Summary (Last 24 hours) at 06/08/16 1037 Last data filed at  06/08/16 0900  Gross per 24 hour  Intake   2410 ml  Output    105 ml  Net   2305 ml    Exam:  Examination:  General exam: Appears calm and comfortable  Respiratory system: Clear to auscultation. Respiratory effort normal. Cardiovascular system: S1 & S2 heard, RRR. No JVD, murmurs, rubs, gallops or clicks. No pedal edema. Gastrointestinal system: Abdomen is nondistended, soft and nontender. No organomegaly or masses felt. Normal bowel sounds heard. Central nervous system: Alert and oriented. No focal neurological deficits. Extremities: Symmetric 5 x 5 power.2+ pitting edema to the dorsal surface of the right hand, wrist and distal forearm. There is erythema and tenderness to palpation. Warmth is appreciated. 2+ radial pulses bilaterally Skin: No rashes, lesions or ulcers Psychiatry: Judgement and insight appear normal. Mood & affect appropriate.     Data Reviewed: I have personally reviewed following labs and imaging studies  Micro Results Recent Results (from the past 240 hour(s))  MRSA PCR Screening     Status: Abnormal   Collection Time: 06/07/16 12:20 AM  Result Value Ref Range Status   MRSA by PCR POSITIVE (A) NEGATIVE Final    Comment:        The GeneXpert MRSA Assay (FDA approved for NASAL specimens only), is one component of a comprehensive MRSA colonization surveillance program. It is not intended to diagnose MRSA infection nor to guide or monitor treatment for MRSA infections. RESULT CALLED TO, READ BACK BY AND VERIFIED WITH: WHITEHORN,S RN [id number] AT 0228 SKEEN,P   Aerobic/Anaerobic Culture (surgical/deep wound)     Status: None (Preliminary result)   Collection Time: 06/07/16  2:42 PM  Result Value Ref Range Status   Specimen Description FLUID SYNOVIAL RIGHT WRIST  Final   Special Requests SWAB  Final   Gram Stain   Final    RARE WBC PRESENT, PREDOMINANTLY PMN NO ORGANISMS SEEN    Culture NO GROWTH < 24 HOURS  Final   Report Status PENDING  Incomplete     Radiology Reports Dg Wrist Complete Right  06/06/2016  CLINICAL DATA:  Status post fall, with right wrist pain. Initial encounter. EXAM: RIGHT WRIST - COMPLETE 3+ VIEW COMPARISON:  Right forearm radiographs from 06/19/2013 FINDINGS: There is no evidence of acute fracture or dislocation. There appears to be mild chronic deformity involving the distal scaphoid, possibly reflecting remote injury. The carpal rows are intact, and demonstrate normal alignment. The joint spaces are preserved. Diffuse dorsal soft tissue swelling is noted along the wrist and hand. IMPRESSION: 1. No evidence of fracture or dislocation. 2. Apparent mild chronic deformity involving the distal scaphoid. 3. Diffuse dorsal soft tissue swelling noted along the wrist and hand. Electronically Signed   By: Garald Balding M.D.   On: 06/06/2016 16:25     CBC  Recent Labs Lab 06/06/16 1620 06/07/16 06/07/16 0248 06/08/16 0802  WBC 8.8 8.3 6.9 5.5  HGB 14.7 14.1 13.4 14.0  HCT 42.3 42.8 40.9 41.8  PLT 244 211 252 260  MCV 89.1 91.1 91.3 90.9  MCH 30.9 30.0 29.9 30.4  MCHC 34.8 32.9 32.8 33.5  RDW 13.0 13.0 13.1 13.1  LYMPHSABS 2.0  --  2.0 1.6  MONOABS 0.9  --  0.8 0.4  EOSABS 0.1  --  0.2 0.2  BASOSABS 0.0  --  0.0 0.0    Chemistries   Recent Labs Lab 06/06/16 1620 06/07/16 06/07/16 0248 06/08/16 0802  NA 136  --  141 135  K 3.3*  --  3.7 3.7  CL 102  --  98* 106  CO2 26  --  25 22  GLUCOSE 100*  --  117* 103*  BUN 10  --  11 8  CREATININE 0.94 0.90 0.93 0.88  CALCIUM 9.0  --  9.9 8.6*  AST 56*  --  44* 44*  ALT 71*  --  61 58  ALKPHOS 56  --  56 56  BILITOT 1.0  --  0.6 0.6   ------------------------------------------------------------------------------------------------------------------ estimated creatinine clearance is 118.6 mL/min (by C-G formula based on Cr of 0.88). ------------------------------------------------------------------------------------------------------------------ No results for  input(s): HGBA1C in the last 72 hours. ------------------------------------------------------------------------------------------------------------------ No results for input(s): CHOL, HDL, LDLCALC, TRIG, CHOLHDL, LDLDIRECT in the last 72 hours. ------------------------------------------------------------------------------------------------------------------ No results for input(s): TSH, T4TOTAL, T3FREE, THYROIDAB in the last 72 hours.  Invalid input(s): FREET3 ------------------------------------------------------------------------------------------------------------------ No results for input(s): VITAMINB12, FOLATE, FERRITIN, TIBC, IRON, RETICCTPCT in the last 72 hours.  Coagulation profile No results for input(s): INR, PROTIME in the last 168 hours.  No results for input(s): DDIMER in the last 72 hours.  Cardiac Enzymes No results for input(s): CKMB, TROPONINI, MYOGLOBIN in the last 168 hours.  Invalid input(s): CK ------------------------------------------------------------------------------------------------------------------ Invalid input(s): POCBNP   CBG: No results for input(s): GLUCAP in the last 168 hours.     Studies: Dg Wrist Complete Right  06/06/2016  CLINICAL DATA:  Status post fall, with right wrist pain. Initial encounter. EXAM: RIGHT WRIST - COMPLETE 3+ VIEW COMPARISON:  Right forearm radiographs from 06/19/2013 FINDINGS: There is no evidence of acute fracture or dislocation. There appears to be mild chronic deformity involving the distal scaphoid, possibly reflecting remote injury. The carpal rows are intact, and demonstrate normal alignment. The joint spaces are preserved. Diffuse dorsal soft tissue swelling is noted along the wrist and hand. IMPRESSION: 1. No evidence of fracture or dislocation. 2. Apparent mild chronic deformity involving the distal scaphoid. 3. Diffuse dorsal soft tissue swelling noted along the wrist and hand. Electronically Signed   By: Garald Balding M.D.   On: 06/06/2016 16:25      No results found for: HGBA1C Lab Results  Component Value Date   CREATININE 0.88 06/08/2016       Scheduled Meds: . irbesartan  300 mg Oral Daily   And  . amLODipine  10 mg Oral Daily  . Chlorhexidine Gluconate Cloth  6 each Topical Q0600  . docusate sodium  50 mg Oral BID  . morphine  15 mg Oral BID  . mupirocin ointment  1 application Nasal BID  . nicotine  21 mg Transdermal Daily  . oxyCODONE  20 mg Oral Q4H  . pantoprazole  40 mg Oral Daily  . piperacillin-tazobactam (ZOSYN)  IV  3.375 g Intravenous Q8H  . polyethylene glycol  17 g Oral BID  . potassium chloride  40 mEq Oral Daily  . vancomycin  1,000 mg Intravenous Q8H   Continuous Infusions: . lactated ringers       LOS: 2 days  Time spent: >30 MINS    [facility] Pager [phone]. If 7PM-7AM, please contact night-coverage at [email or web address], password [facility] 06/08/2016, 10:37 AM  LOS: 2 days

## 2016-06-08 NOTE — Progress Notes (Signed)
Patient observed wandering around room this morning with ACE wrap and partial dressing removed from right arm site.  A number of attempts have been made to draw AM labs.  However, patient insistent on having Phlebotomist wait outside or return at a later time.  Patient advised to comply with treatment plan.

## 2016-06-08 NOTE — Progress Notes (Signed)
Patient refused scans ordered by doctor.  Informed doctor that patient refused.  Educated patient about why he needs the scans, patient is still refusing.

## 2016-06-08 NOTE — Progress Notes (Signed)
Patient continues to report pain levels 6-8 on a scale of 0-10 despite consistent narcotic administration.  Patient exhibits behavior of an attention-seeking nature (i.e constant requests in spite of staff's efforts to meet physical and emotional needs for food, pain, personal care, etc).  Also, tendency to behave less than chronological age as demonstrated by patient's crying episode during a PIV start and request for urine specimen per MDO.

## 2016-06-09 ENCOUNTER — Encounter (HOSPITAL_COMMUNITY): Payer: Self-pay | Admitting: General Surgery

## 2016-06-09 LAB — CBC WITH DIFFERENTIAL/PLATELET
BASOS PCT: 0 %
Basophils Absolute: 0 10*3/uL (ref 0.0–0.1)
Eosinophils Absolute: 0.2 10*3/uL (ref 0.0–0.7)
Eosinophils Relative: 3 %
HEMATOCRIT: 39.5 % (ref 39.0–52.0)
HEMOGLOBIN: 13.2 g/dL (ref 13.0–17.0)
LYMPHS ABS: 2 10*3/uL (ref 0.7–4.0)
Lymphocytes Relative: 34 %
MCH: 30.4 pg (ref 26.0–34.0)
MCHC: 33.4 g/dL (ref 30.0–36.0)
MCV: 91 fL (ref 78.0–100.0)
MONO ABS: 0.5 10*3/uL (ref 0.1–1.0)
MONOS PCT: 9 %
NEUTROS ABS: 3.1 10*3/uL (ref 1.7–7.7)
NEUTROS PCT: 54 %
Platelets: 276 10*3/uL (ref 150–400)
RBC: 4.34 MIL/uL (ref 4.22–5.81)
RDW: 13 % (ref 11.5–15.5)
WBC: 5.8 10*3/uL (ref 4.0–10.5)

## 2016-06-09 LAB — COMPREHENSIVE METABOLIC PANEL
ALK PHOS: 63 U/L (ref 38–126)
ALT: 49 U/L (ref 17–63)
AST: 36 U/L (ref 15–41)
Albumin: 2.8 g/dL — ABNORMAL LOW (ref 3.5–5.0)
Anion gap: 9 (ref 5–15)
BILIRUBIN TOTAL: 0.6 mg/dL (ref 0.3–1.2)
BUN: 6 mg/dL (ref 6–20)
CALCIUM: 8.6 mg/dL — AB (ref 8.9–10.3)
CHLORIDE: 105 mmol/L (ref 101–111)
CO2: 25 mmol/L (ref 22–32)
CREATININE: 0.92 mg/dL (ref 0.61–1.24)
Glucose, Bld: 100 mg/dL — ABNORMAL HIGH (ref 65–99)
Potassium: 3.7 mmol/L (ref 3.5–5.1)
Sodium: 139 mmol/L (ref 135–145)
TOTAL PROTEIN: 6.3 g/dL — AB (ref 6.5–8.1)

## 2016-06-09 LAB — VANCOMYCIN, TROUGH: Vancomycin Tr: 18 ug/mL (ref 15–20)

## 2016-06-09 MED ORDER — LORAZEPAM 0.5 MG PO TABS: 0.5000 mg | ORAL_TABLET | Freq: Four times a day (QID) | ORAL | Status: DC | PRN

## 2016-06-09 MED ORDER — TEMAZEPAM 15 MG PO CAPS
15.0000 mg | ORAL_CAPSULE | Freq: Every evening | ORAL | Status: DC | PRN
Start: 2016-06-09 — End: 2022-07-20

## 2016-06-09 MED ORDER — DOCUSATE SODIUM 50 MG PO CAPS: 50.0000 mg | ORAL_CAPSULE | Freq: Two times a day (BID) | ORAL | Status: DC

## 2016-06-09 MED ORDER — OXYCODONE HCL 5 MG PO TABS
10.0000 mg | ORAL_TABLET | Freq: Once | ORAL | Status: AC
  Administered 2016-06-09: 10 mg via ORAL
  Filled 2016-06-09: qty 2

## 2016-06-09 MED ORDER — POLYETHYLENE GLYCOL 3350 17 G PO PACK: 17.0000 g | PACK | Freq: Two times a day (BID) | ORAL | Status: DC

## 2016-06-09 MED ORDER — OXYCODONE HCL 5 MG PO TABS
15.0000 mg | ORAL_TABLET | ORAL | Status: DC
  Administered 2016-06-09 – 2016-06-10 (×6): 15 mg via ORAL
  Filled 2016-06-09 (×6): qty 3

## 2016-06-09 MED ORDER — LORAZEPAM 1 MG PO TABS: 1.0000 mg | ORAL_TABLET | Freq: Four times a day (QID) | ORAL | Status: DC | PRN

## 2016-06-09 MED ORDER — POTASSIUM CHLORIDE 20 MEQ/15ML (10%) PO SOLN: 40.0000 meq | Freq: Every day | ORAL | Status: DC

## 2016-06-09 MED ORDER — SULFAMETHOXAZOLE-TRIMETHOPRIM 800-160 MG PO TABS
1.0000 | ORAL_TABLET | Freq: Two times a day (BID) | ORAL | Status: DC
  Administered 2016-06-09 – 2016-06-10 (×3): 1 via ORAL
  Filled 2016-06-09 (×3): qty 1

## 2016-06-09 MED ORDER — PANTOPRAZOLE SODIUM 40 MG PO TBEC: 40.0000 mg | DELAYED_RELEASE_TABLET | Freq: Every day | ORAL | Status: DC

## 2016-06-09 MED ORDER — NICOTINE 21 MG/24HR TD PT24: 21.0000 mg | MEDICATED_PATCH | Freq: Every day | TRANSDERMAL | Status: DC

## 2016-06-09 MED ORDER — AMOXICILLIN-POT CLAVULANATE 875-125 MG PO TABS: 1.0000 | ORAL_TABLET | Freq: Two times a day (BID) | ORAL | Status: DC

## 2016-06-09 MED ORDER — DOXYCYCLINE HYCLATE 100 MG PO TBEC: 100.0000 mg | DELAYED_RELEASE_TABLET | Freq: Two times a day (BID) | ORAL | Status: DC

## 2016-06-09 MED ORDER — OXYCODONE HCL 10 MG PO TABS: 10.0000 mg | ORAL_TABLET | ORAL | Status: DC

## 2016-06-09 NOTE — Progress Notes (Addendum)
[facility] PROGRESS NOTE  Ronnie Moore [id number] DOB: June 01, 1961 DOA: 06/06/2016   PCP: No primary care provider on file.     Assessment/Plan: Principal Problem:   Cellulitis of right upper extremity Active Problems:   Hypokalemia   Essential hypertension   Abnormal LFTs   Chronic back pain    55 y.o. male with medical history significant of chronic back pain, CVA, hypertension, osteoarthritis, previous MRSA infection, obesity who was transferred from [facility] due to right wrist and hand pain after falling and injuring area a week ago and low-grade fever for the past 2 days. The patient states that he has post CVA chronic circulation issues of the right upper extremity.  ED Course: Workup revealed hypokalemia of 3.3 mmol per liter, elevated CRP and sedimentation rate and right wrist radiograph showing soft tissue swelling of the wrist and hand. The patient received IV antibiotics and analgesics. He was transferred to [facility] for further treatment and evaluation.  Assessment and plan Cellulitis of right upper extremity Status post aspiration of the right wrist joint and incision and drainage of the right wrist extensor tendon sheath on 7/3 Continue scheduled and when necessary analgesics. Minimize IV Dilaudid, however patient is extremely noncompliant and when he gets somnolent, he tries to get out of the bed and fall We have cut back on his IV and oral narcotics Continue IV antibiotics. Vancomycin, Zosyn , pending culture results and Gram stain results from   Aspiration of the right wrist, patient is MRSA positive  appreciate  Dr. Lenon Curt  Input  , called his office again on 7/5 to follow-up on the wound as it was oozing blood yesterday, also to make wound care recommendations and recommend a follow-up plan of care Suspect patient has right upper extremity swelling both secondary to cellulitis as well as his prior CVA Follow culture data and  narrow antibiotics accordingly, anticipate patient will be discharged on bactrim for 14 days at discharge Uric acid 6.1   Fall Patient fell yesterday on his bottom, did not hit his head Has declined x-rays yesterday I have ordered a repeat CT abdomen pelvis to rule out fracture If the patient declines, would need to get him up to assess mobility to determine if the patient is able to go back home PT OT evaluation Called patient's mother, who stated that the patient's daughter does not communicate with her father, patient's sister makes all the decisions for him, her name is Ronnie Moore and she can be reached at [phone] Requested social work to evaluate home situation and see if she is safe to be discharged back to his home   Hypokalemia -Repleted  Likely due to the use of hydrochlorothiazide without potassium supplementation. Potassium supplementation added. Discontinue HCTZ   Essential hypertension Continue Avapro 300 mg by mouth daily. Continue amlodipine 10 mg by mouth daily. Monitor blood pressure.   Abnormal LFTs , positive hepatitis C antibody  States he only drinks alcohol in small amounts very sporadically. History of hepatitis B per patient. He believes it is non-active hep B. Hepatitis C antibody positive HIV nonreactive   Chronic back pain Continue long acting morphine 15 mg by mouth twice a day. Was taking 30 mg long-acting at home Continue oxycodone 20 reduce to 10 mg mg by mouth every 4 hours when necessary. reduce  Lorazepam from  2 mg to 0.5 mg by mouth 3 times a day when necessary as needed for back spasms.due  to oversedation Pt also given one dose of narcan due to oversedation  Patient not safe to live at home alone with such high doses of chronic narcotics    DVT prophylaxsis Lovenox  Code Status:    Full code   Family Communication: Discussed in detail with the patient, all imaging results, lab results explained to the patient   Disposition Plan: PT  evaluation, anticipate he will need SNF upon discharge, not safe to go home due to fall in the hospital, and extreme noncompliance with recommendations     Consultants:  Hand surgery  Procedures: PROCEDURE: 1. Aspiration of right wrist joint. 2. Open drainage incision and drainage of the right wrist extensor  tendon sheath.  Antibiotics: Anti-infectives    Start     Dose/Rate Route Frequency Ordered Stop   06/07/16 0600  piperacillin-tazobactam (ZOSYN) IVPB 3.375 g     3.375 g 12.5 mL/hr over 240 Minutes Intravenous Every 8 hours 06/06/16 2236     06/06/16 2300  vancomycin (VANCOCIN) IVPB 1000 mg/200 mL premix     1,000 mg 200 mL/hr over 60 Minutes Intravenous Every 8 hours 06/06/16 2236        HPI/Subjective: Low-grade fever, denies any cough, no chest pain or shortness of breath  Objective: Filed Vitals:   06/08/16 1825 06/08/16 1843 06/08/16 2016 06/09/16 0422  BP: 113/82 123/77 145/76 124/60  Pulse: 85 80 89 96  Temp: 97.7 F (36.5 C)  98.6 F (37 C) 99.4 F (37.4 C)  TempSrc: Oral  Oral Oral  Resp: 16  18 20   Height:      Weight:      SpO2: 99% 100% 100% 99%    Intake/Output Summary (Last 24 hours) at 06/09/16 [id number] Last data filed at 06/08/16 2214  Gross per 24 hour  Intake    970 ml  Output    300 ml  Net    670 ml    Exam:  Examination:  General exam: Appears calm and comfortable  Respiratory system: Clear to auscultation. Respiratory effort normal. Cardiovascular system: S1 & S2 heard, RRR. No JVD, murmurs, rubs, gallops or clicks. No pedal edema. Gastrointestinal system: Abdomen is nondistended, soft and nontender. No organomegaly or masses felt. Normal bowel sounds heard. Central nervous system: Alert and oriented. No focal neurological deficits. Extremities: Symmetric 5 x 5 power.2+ pitting edema to the dorsal surface of the right hand, wrist and distal forearm. There is erythema and tenderness to palpation. Warmth is appreciated. 2+ radial  pulses bilaterally Skin: No rashes, lesions or ulcers Psychiatry: Judgement and insight appear normal. Mood & affect appropriate.     Data Reviewed: I have personally reviewed following labs and imaging studies  Micro Results Recent Results (from the past 240 hour(s))  MRSA PCR Screening     Status: Abnormal   Collection Time: 06/07/16 12:20 AM  Result Value Ref Range Status   MRSA by PCR POSITIVE (A) NEGATIVE Final    Comment:        The GeneXpert MRSA Assay (FDA approved for NASAL specimens only), is one component of a comprehensive MRSA colonization surveillance program. It is not intended to diagnose MRSA infection nor to guide or monitor treatment for MRSA infections. RESULT CALLED TO, READ BACK BY AND VERIFIED WITH: WHITEHORN,S RN [id number] AT 0228 SKEEN,P   Aerobic/Anaerobic Culture (surgical/deep wound)     Status: None (Preliminary result)   Collection Time: 06/07/16  2:42 PM  Result Value Ref Range Status   Specimen Description FLUID  SYNOVIAL RIGHT WRIST  Final   Special Requests SWAB  Final   Gram Stain   Final    RARE WBC PRESENT, PREDOMINANTLY PMN NO ORGANISMS SEEN    Culture NO GROWTH < 24 HOURS  Final   Report Status PENDING  Incomplete    Radiology Reports Dg Wrist Complete Right  06/06/2016  CLINICAL DATA:  Status post fall, with right wrist pain. Initial encounter. EXAM: RIGHT WRIST - COMPLETE 3+ VIEW COMPARISON:  Right forearm radiographs from 06/19/2013 FINDINGS: There is no evidence of acute fracture or dislocation. There appears to be mild chronic deformity involving the distal scaphoid, possibly reflecting remote injury. The carpal rows are intact, and demonstrate normal alignment. The joint spaces are preserved. Diffuse dorsal soft tissue swelling is noted along the wrist and hand. IMPRESSION: 1. No evidence of fracture or dislocation. 2. Apparent mild chronic deformity involving the distal scaphoid. 3. Diffuse dorsal soft tissue swelling noted along the  wrist and hand. Electronically Signed   By: Garald Balding M.D.   On: 06/06/2016 16:25     CBC  Recent Labs Lab 06/06/16 1620 06/07/16 06/07/16 0248 06/08/16 0802 06/09/16 0321  WBC 8.8 8.3 6.9 5.5 5.8  HGB 14.7 14.1 13.4 14.0 13.2  HCT 42.3 42.8 40.9 41.8 39.5  PLT 244 211 252 260 276  MCV 89.1 91.1 91.3 90.9 91.0  MCH 30.9 30.0 29.9 30.4 30.4  MCHC 34.8 32.9 32.8 33.5 33.4  RDW 13.0 13.0 13.1 13.1 13.0  LYMPHSABS 2.0  --  2.0 1.6 2.0  MONOABS 0.9  --  0.8 0.4 0.5  EOSABS 0.1  --  0.2 0.2 0.2  BASOSABS 0.0  --  0.0 0.0 0.0    Chemistries   Recent Labs Lab 06/06/16 1620 06/07/16 06/07/16 0248 06/08/16 0802 06/09/16 0321  NA 136  --  141 135 139  K 3.3*  --  3.7 3.7 3.7  CL 102  --  98* 106 105  CO2 26  --  25 22 25   GLUCOSE 100*  --  117* 103* 100*  BUN 10  --  11 8 6   CREATININE 0.94 0.90 0.93 0.88 0.92  CALCIUM 9.0  --  9.9 8.6* 8.6*  AST 56*  --  44* 44* 36  ALT 71*  --  61 58 49  ALKPHOS 56  --  56 56 63  BILITOT 1.0  --  0.6 0.6 0.6   ------------------------------------------------------------------------------------------------------------------ estimated creatinine clearance is 113.5 mL/min (by C-G formula based on Cr of 0.92). ------------------------------------------------------------------------------------------------------------------ No results for input(s): HGBA1C in the last 72 hours. ------------------------------------------------------------------------------------------------------------------ No results for input(s): CHOL, HDL, LDLCALC, TRIG, CHOLHDL, LDLDIRECT in the last 72 hours. ------------------------------------------------------------------------------------------------------------------ No results for input(s): TSH, T4TOTAL, T3FREE, THYROIDAB in the last 72 hours.  Invalid input(s): FREET3 ------------------------------------------------------------------------------------------------------------------ No results for input(s):  VITAMINB12, FOLATE, FERRITIN, TIBC, IRON, RETICCTPCT in the last 72 hours.  Coagulation profile No results for input(s): INR, PROTIME in the last 168 hours.  No results for input(s): DDIMER in the last 72 hours.  Cardiac Enzymes No results for input(s): CKMB, TROPONINI, MYOGLOBIN in the last 168 hours.  Invalid input(s): CK ------------------------------------------------------------------------------------------------------------------ Invalid input(s): POCBNP   CBG: No results for input(s): GLUCAP in the last 168 hours.     Studies: No results found.    No results found for: HGBA1C Lab Results  Component Value Date   CREATININE 0.92 06/09/2016       Scheduled Meds: . irbesartan  300 mg Oral Daily   And  .  amLODipine  10 mg Oral Daily  . Chlorhexidine Gluconate Cloth  6 each Topical Q0600  . docusate sodium  50 mg Oral BID  . morphine  15 mg Oral BID  . mupirocin ointment  1 application Nasal BID  . nicotine  21 mg Transdermal Daily  . oxyCODONE  10 mg Oral Q4H  . pantoprazole  40 mg Oral Daily  . piperacillin-tazobactam (ZOSYN)  IV  3.375 g Intravenous Q8H  . polyethylene glycol  17 g Oral BID  . potassium chloride  40 mEq Oral Daily  . vancomycin  1,000 mg Intravenous Q8H   Continuous Infusions: . lactated ringers       LOS: 3 days    Time spent: >30 MINS    [facility]  [facility] Pager [phone]. If 7PM-7AM, please contact night-coverage at [email or web address], password [email or web address] 06/09/2016, 9:21 AM  LOS: 3 days

## 2016-06-09 NOTE — Discharge Instructions (Signed)
Wash left hand with soap and water daily, cover with 4x4s, and place in splint

## 2016-06-09 NOTE — Significant Event (Signed)
Rapid Response Event Note  Overview: Time Called: 1900 Arrival Time: 1925 Event Type: Other (Comment)  Initial Focused Assessment: Pt found on ground by RN.  Patient admitted for treatment of RUE cellititus, patient s/p I and D, per RN patient was oversedation, 0.4 Narcan given and scans/films ordered by provider  Interventions: Patient was alert and oriented, per patient he was walking to the bathroom and tripped over his feet, patient was able move all extremities, neuro exam was intact. Patient stated he did not needs any CT scans and Xrays and that he suffers from chronic pain overall.  Patient educated on why the scans were ordered, patient stated he understood but did not want them done  Plan of Care (if not transferred): informed RN to monitor neuro exams since patient in Lovenox.   Event Summary: Name of Physician Notified: Abrol/Kirby at [place]    at    Outcome: Stayed in room and stabalized     Jarious Lyon R

## 2016-06-09 NOTE — Progress Notes (Signed)
PHARMACIST - PHYSICIAN COMMUNICATION Physician:  Derrek Gu. CONCERNING:  Controlled substance dispense report for Ronnie Moore; DOB 11/07/1961  06/10/2015 - 06/09/2016  Oxycodone 20mg  QTY: 150 Days supply: 30 Provider: Mohammed Kindle. Dispensed on: 05/24/16; 04/23/16; 03/25/16; 02/1816; 01/23/16; 12/24/15; 11/22/15; 10/25/15; 09/24/15; 08/25/15  Oxycodone 20 mg QTY: 120  Days supply: 30 Provider: Buel Ream. Dispensed on: 08/01/15  Hydrocodone/APAP 5/325mg  QTY: 20 Days supply: 5 Provider: Simoncic, S. Dispensed on: 09/23/25  Fentanyl 50 mcg/hr patch QTY: 2  Days supply: 6 Provider: Mohammed Kindle Dispensed on: 08/13/15  Fentanyl 25 mcg/hr patch QTY: 2  Days supply: 6 Provider: Mohammed Kindle Dispensed on: 08/13/15  Fentanyl 75 mcg/hr patch QTY: 10  Days supply: 30 Provider: Buel Ream.  Dispensed on: 08/01/15  Alprazolam 0.5mg  QTY: 60  Days supply: 20 Provider: Buel Ream. Dispensed on: 08/01/15  Phentermine 37.5mg  QTY: 30 Days supply: 30 Provider: Mohammed Kindle Dispensed on: 02/21/16   Stephens November, PharmD Clinical Pharmacist  3:40 PM, 06/09/2016

## 2016-06-09 NOTE — Discharge Summary (Signed)
Physician Discharge Summary  Ronnie Moore MRN: [id number] DOB/AGE: November 02, 1961 55 y.o.  PCP: No primary care provider on file.   Admit date: 06/06/2016 Discharge date: 06/09/2016  Discharge Diagnoses:     Principal Problem:   Cellulitis of right upper extremity Active Problems:   Hypokalemia   Essential hypertension   Abnormal LFTs   Chronic back pain    Follow-up recommendations Follow-up with PCP in 3-5 days , including all  additional recommended appointments as below Follow-up CBC, CMP in 3-5 days Patient to follow-up with  Dayna Barker, MD in the next 1-2 weeks   Note is that the patient left [facility] as he did not want to comply with my recommendations as far as his pain regimen is concerned, and despite the fact that the patient had a witnessed fall. Patient kept saying that he did not fall and he did not look oversedated yesterday at the time of the fall. Notified the patient that he had to be given Narcan to help him with his somnolence and mentation at the time of the fall. Patient is not agreeable to decrease the dose of his oxycodone, continues to request IV Dilaudid in the hospital. Patient declines SNF. Discussed with the patient that he is not safe to be discharged to home with all these pain medications as she has already had one fall at home, and one fall in the hospital. Patient declined x-rays to rule out hip fracture. Patient declined bed alarm. Patient refused to be compliant with nursing recommendations. This discussion was completed in the presence of the nurse Claiborne Billings at the time of discharge.       Current Discharge Medication List    START taking these medications   Details  amoxicillin-clavulanate (AUGMENTIN) 875-125 MG tablet Take 1 tablet by mouth 2 (two) times daily. Qty: 28 tablet, Refills: 0    docusate sodium (COLACE) 50 MG capsule Take 1 capsule (50 mg total) by mouth 2 (two) times daily. Qty: 10 capsule, Refills: 0     doxycycline (DORYX) 100 MG EC tablet Take 1 tablet (100 mg total) by mouth 2 (two) times daily. Qty: 28 tablet, Refills: 0    nicotine (NICODERM CQ - DOSED IN MG/24 HOURS) 21 mg/24hr patch Place 1 patch (21 mg total) onto the skin daily. Qty: 28 patch, Refills: 0    pantoprazole (PROTONIX) 40 MG tablet Take 1 tablet (40 mg total) by mouth daily. Qty: 30 tablet, Refills: 1    polyethylene glycol (MIRALAX / GLYCOLAX) packet Take 17 g by mouth 2 (two) times daily. Qty: 14 each, Refills: 0    potassium chloride 20 MEQ/15ML (10%) SOLN Take 30 mLs (40 mEq total) by mouth daily. Qty: 900 mL, Refills: 0    temazepam (RESTORIL) 15 MG capsule Take 1 capsule (15 mg total) by mouth at bedtime as needed for sleep. Qty: 30 capsule, Refills: 0      CONTINUE these medications which have CHANGED   Details  LORazepam (ATIVAN) 0.5 MG tablet Take 1 tablet (0.5 mg total) by mouth every 6 (six) hours as needed for anxiety. Qty: 1 tablet, Refills: 0      CONTINUE these medications which have NOT CHANGED   Details  Olmesartan-Amlodipine-HCTZ (TRIBENZOR) 40-10-25 MG TABS Take 1 tablet by mouth daily.    Oxycodone HCl 20 MG TABS Take 20 mg by mouth every 4 (four) hours. Refills: 0    meloxicam (MOBIC) 15 MG tablet Take 1 tablet (15 mg total) by mouth daily.  Qty: 6 tablet, Refills: 0      STOP taking these medications     ibuprofen (ADVIL,MOTRIN) 800 MG tablet      morphine (AVINZA) 30 MG 24 hr capsule          Discharge Condition:   Left AGAINST MEDICAL ADVICE Concern for readmission Fall Respiratory arrest   Discharge Instructions Get Medicines reviewed and adjusted: Please take all your medications with you for your next visit with your Primary MD  Please request your Primary MD to go over all hospital tests and procedure/radiological results at the follow up, please ask your Primary MD to get all Hospital records sent to his/her office.  If you experience worsening of your  admission symptoms, develop shortness of breath, life threatening emergency, suicidal or homicidal thoughts you must seek medical attention immediately by calling 911 or calling your MD immediately if symptoms less severe.  You must read complete instructions/literature along with all the possible adverse reactions/side effects for all the Medicines you take and that have been prescribed to you. Take any new Medicines after you have completely understood and accpet all the possible adverse reactions/side effects.   Do not drive when taking Pain medications.   Do not take more than prescribed Pain, Sleep and Anxiety Medications  Special Instructions: If you have smoked or chewed Tobacco in the last 2 yrs please stop smoking, stop any regular Alcohol and or any Recreational drug use.  Wear Seat belts while driving.  Please note  You were cared for by a hospitalist during your hospital stay. Once you are discharged, your primary care physician will handle any further medical issues. Please note that NO REFILLS for any discharge medications will be authorized once you are discharged, as it is imperative that you return to your primary care physician (or establish a relationship with a primary care physician if you do not have one) for your aftercare needs so that they can reassess your need for medications and monitor your lab values.     Allergies  Allergen Reactions  . Metaxalone Swelling    Reaction to Skelaxin - lips swelled      Disposition: 01-Home or Self Care   Consults:  Hand surgery   Significant Diagnostic Studies:  Dg Wrist Complete Right  06/06/2016  CLINICAL DATA:  Status post fall, with right wrist pain. Initial encounter. EXAM: RIGHT WRIST - COMPLETE 3+ VIEW COMPARISON:  Right forearm radiographs from 06/19/2013 FINDINGS: There is no evidence of acute fracture or dislocation. There appears to be mild chronic deformity involving the distal scaphoid, possibly  reflecting remote injury. The carpal rows are intact, and demonstrate normal alignment. The joint spaces are preserved. Diffuse dorsal soft tissue swelling is noted along the wrist and hand. IMPRESSION: 1. No evidence of fracture or dislocation. 2. Apparent mild chronic deformity involving the distal scaphoid. 3. Diffuse dorsal soft tissue swelling noted along the wrist and hand. Electronically Signed   By: Garald Balding M.D.   On: 06/06/2016 16:25        Filed Weights   06/06/16 1516 06/06/16 2058  Weight: 113.399 kg (250 lb) 113.3 kg (249 lb 12.5 oz)     Microbiology: Recent Results (from the past 240 hour(s))  MRSA PCR Screening     Status: Abnormal   Collection Time: 06/07/16 12:20 AM  Result Value Ref Range Status   MRSA by PCR POSITIVE (A) NEGATIVE Final    Comment:        The GeneXpert MRSA  Assay (FDA approved for NASAL specimens only), is one component of a comprehensive MRSA colonization surveillance program. It is not intended to diagnose MRSA infection nor to guide or monitor treatment for MRSA infections. RESULT CALLED TO, READ BACK BY AND VERIFIED WITH: WHITEHORN,S RN [id number] AT 0228 SKEEN,P   Aerobic/Anaerobic Culture (surgical/deep wound)     Status: None (Preliminary result)   Collection Time: 06/07/16  2:42 PM  Result Value Ref Range Status   Specimen Description FLUID SYNOVIAL RIGHT WRIST  Final   Special Requests SWAB  Final   Gram Stain   Final    RARE WBC PRESENT, PREDOMINANTLY PMN NO ORGANISMS SEEN    Culture NO GROWTH < 24 HOURS  Final   Report Status PENDING  Incomplete       Blood Culture    Component Value Date/Time   SDES FLUID SYNOVIAL RIGHT WRIST 06/07/2016 1442   SPECREQUEST SWAB 06/07/2016 1442   CULT NO GROWTH < 24 HOURS 06/07/2016 1442   REPTSTATUS PENDING 06/07/2016 1442      Labs: Results for orders placed or performed during the hospital encounter of 06/06/16 (from the past 48 hour(s))  Aerobic/Anaerobic Culture  (surgical/deep wound)     Status: None (Preliminary result)   Collection Time: 06/07/16  2:42 PM  Result Value Ref Range   Specimen Description FLUID SYNOVIAL RIGHT WRIST    Special Requests SWAB    Gram Stain      RARE WBC PRESENT, PREDOMINANTLY PMN NO ORGANISMS SEEN    Culture NO GROWTH < 24 HOURS    Report Status PENDING   Urine rapid drug screen (hosp performed)     Status: Abnormal   Collection Time: 06/08/16  1:15 AM  Result Value Ref Range   Opiates POSITIVE (A) NONE DETECTED   Cocaine NONE DETECTED NONE DETECTED   Benzodiazepines NONE DETECTED NONE DETECTED   Amphetamines NONE DETECTED NONE DETECTED   Tetrahydrocannabinol NONE DETECTED NONE DETECTED   Barbiturates NONE DETECTED NONE DETECTED    Comment:        DRUG SCREEN FOR MEDICAL PURPOSES ONLY.  IF CONFIRMATION IS NEEDED FOR ANY PURPOSE, NOTIFY LAB WITHIN 5 DAYS.        LOWEST DETECTABLE LIMITS FOR URINE DRUG SCREEN Drug Class       Cutoff (ng/mL) Amphetamine      1000 Barbiturate      200 Benzodiazepine   397 Tricyclics       673 Opiates          300 Cocaine          300 THC              50   CBC WITH DIFFERENTIAL     Status: None   Collection Time: 06/08/16  8:02 AM  Result Value Ref Range   WBC 5.5 4.0 - 10.5 K/uL   RBC 4.60 4.22 - 5.81 MIL/uL   Hemoglobin 14.0 13.0 - 17.0 g/dL   HCT 41.8 39.0 - 52.0 %   MCV 90.9 78.0 - 100.0 fL   MCH 30.4 26.0 - 34.0 pg   MCHC 33.5 30.0 - 36.0 g/dL   RDW 13.1 11.5 - 15.5 %   Platelets 260 150 - 400 K/uL   Neutrophils Relative % 60 %   Neutro Abs 3.3 1.7 - 7.7 K/uL   Lymphocytes Relative 29 %   Lymphs Abs 1.6 0.7 - 4.0 K/uL   Monocytes Relative 7 %   Monocytes Absolute 0.4 0.1 - 1.0 K/uL  Eosinophils Relative 4 %   Eosinophils Absolute 0.2 0.0 - 0.7 K/uL   Basophils Relative 0 %   Basophils Absolute 0.0 0.0 - 0.1 K/uL  Comprehensive metabolic panel     Status: Abnormal   Collection Time: 06/08/16  8:02 AM  Result Value Ref Range   Sodium 135 135 - 145  mmol/L   Potassium 3.7 3.5 - 5.1 mmol/L   Chloride 106 101 - 111 mmol/L   CO2 22 22 - 32 mmol/L   Glucose, Bld 103 (H) 65 - 99 mg/dL   BUN 8 6 - 20 mg/dL   Creatinine, Ser 4.79 0.61 - 1.24 mg/dL   Calcium 8.6 (L) 8.9 - 10.3 mg/dL   Total Protein 7.1 6.5 - 8.1 g/dL   Albumin 3.2 (L) 3.5 - 5.0 g/dL   AST 44 (H) 15 - 41 U/L   ALT 58 17 - 63 U/L   Alkaline Phosphatase 56 38 - 126 U/L   Total Bilirubin 0.6 0.3 - 1.2 mg/dL   GFR calc non Af Amer >60 >60 mL/min   GFR calc Af Amer >60 >60 mL/min    Comment: (NOTE) The eGFR has been calculated using the CKD EPI equation. This calculation has not been validated in all clinical situations. eGFR's persistently <60 mL/min signify possible Chronic Kidney Disease.    Anion gap 7 5 - 15  CBC WITH DIFFERENTIAL     Status: None   Collection Time: 06/09/16  3:21 AM  Result Value Ref Range   WBC 5.8 4.0 - 10.5 K/uL   RBC 4.34 4.22 - 5.81 MIL/uL   Hemoglobin 13.2 13.0 - 17.0 g/dL   HCT 98.0 01.2 - 39.3 %   MCV 91.0 78.0 - 100.0 fL   MCH 30.4 26.0 - 34.0 pg   MCHC 33.4 30.0 - 36.0 g/dL   RDW 59.4 09.0 - 50.2 %   Platelets 276 150 - 400 K/uL   Neutrophils Relative % 54 %   Neutro Abs 3.1 1.7 - 7.7 K/uL   Lymphocytes Relative 34 %   Lymphs Abs 2.0 0.7 - 4.0 K/uL   Monocytes Relative 9 %   Monocytes Absolute 0.5 0.1 - 1.0 K/uL   Eosinophils Relative 3 %   Eosinophils Absolute 0.2 0.0 - 0.7 K/uL   Basophils Relative 0 %   Basophils Absolute 0.0 0.0 - 0.1 K/uL  Comprehensive metabolic panel     Status: Abnormal   Collection Time: 06/09/16  3:21 AM  Result Value Ref Range   Sodium 139 135 - 145 mmol/L   Potassium 3.7 3.5 - 5.1 mmol/L   Chloride 105 101 - 111 mmol/L   CO2 25 22 - 32 mmol/L   Glucose, Bld 100 (H) 65 - 99 mg/dL   BUN 6 6 - 20 mg/dL   Creatinine, Ser 5.61 0.61 - 1.24 mg/dL   Calcium 8.6 (L) 8.9 - 10.3 mg/dL   Total Protein 6.3 (L) 6.5 - 8.1 g/dL   Albumin 2.8 (L) 3.5 - 5.0 g/dL   AST 36 15 - 41 U/L   ALT 49 17 - 63 U/L    Alkaline Phosphatase 63 38 - 126 U/L   Total Bilirubin 0.6 0.3 - 1.2 mg/dL   GFR calc non Af Amer >60 >60 mL/min   GFR calc Af Amer >60 >60 mL/min    Comment: (NOTE) The eGFR has been calculated using the CKD EPI equation. This calculation has not been validated in all clinical situations. eGFR's persistently <60 mL/min signify  possible Chronic Kidney Disease.    Anion gap 9 5 - 15     Lipid Panel  No results found for: CHOL, TRIG, HDL, CHOLHDL, VLDL, LDLCALC, LDLDIRECT   No results found for: HGBA1C   Lab Results  Component Value Date   CREATININE 0.92 06/09/2016     HPI :   Ronnie Moore is a 55 y.o. male with medical history significant of chronic back pain, CVA, hypertension, osteoarthritis, previous MRSA infection, obesity who was transferred from [facility] due to right wrist and hand pain after falling and injuring area a week ago and low-grade fever for the past 2 days. The patient states that he has post CVA chronic circulation issues of the right upper extremity.  ED Course: Workup revealed hypokalemia of 3.3 mmol per liter, elevated CRP and sedimentation rate and right wrist radiograph showing soft tissue swelling of the wrist and hand. The patient received IV antibiotics and analgesics. He was transferred to [facility] for further treatment and evaluation.   HOSPITAL COURSE:   Cellulitis of right upper extremity Status post aspiration of the right wrist joint and incision and drainage of the right wrist extensor tendon sheath on 7/3   Discontinued  IV Dilaudid, however patient noted to be extremely noncompliant and when he gets somnolent, he tries to get out of the bed and fall We recommended to cut back on IV and oral narcotics. Patient declined Received IV antibiotics. Vancomycin, Zosyn 3 days , pending culture results and Gram stain results from Aspiration of the right wrist, patient is MRSA positive appreciate Dr. Lenon Curt Input , called  his office again on 7/5 to follow-up on the wound as it was oozing blood yesterday, also to make wound care recommendations and recommend a follow-up plan of care Suspect patient has right upper extremity swelling both secondary to cellulitis as well as his prior CVA Follow culture data and narrow antibiotics accordingly, recommend that patient will be discharged on Augmentin and doxycycline for 14 days at discharge Uric acid 6.1 Lots of issues with pain management, patient has finally decided to leave [place] due to noncompliance with nursing recommendations Patient fell yesterday on his bottom, did not hit his head Has declined x-rays yesterday, declined CT scan today If the patient declines, would need to get him up to assess mobility to determine if the patient is able to go back home PT OT evaluation Called patient's mother, who stated that the patient's daughter does not communicate with her father, patient's sister makes all the decisions for him, her name is [name] and she can be reached at [phone] Requested social work to evaluate home situation and see if she is safe to be discharged back to his home Patient's brother was in the room at the time we had a long discussion about the patient's pain medications contributing to the fall. Patient became anxious tearful and requested to go home    Hypokalemia -Repleted  Likely due to the use of hydrochlorothiazide without potassium supplementation. Potassium repleted prior to discharge     Essential hypertension Continue Avapro 300 mg by mouth daily. Continue amlodipine 10 mg by mouth daily. Monitor blood pressure.   Abnormal LFTs , positive hepatitis C antibody  States he only drinks alcohol in small amounts very sporadically. History of hepatitis B per patient. He believes it is non-active hep B. Hepatitis C antibody positive HIV nonreactive   Chronic back pain Continue oxycodone 20 reduced to  10 mg  mg by mouth every 4 hours when necessary. Patient extremely unhappy about this and would like to go home reduced Lorazepam from 2 mg to 0.5 mg by mouth 3 times a day when necessary as needed for back spasms.due to oversedation Pt received 1 dose of narcan due to oversedation  Patient not safe to live at home alone with such high doses of chronic narcotics, this was discussed with the patient and her, and the patient's mother on the phone. Patient has decided to leave the hospital today AMA       Discharge Exam:   Blood pressure 124/60, pulse 96, temperature 99.4 F (37.4 C), temperature source Oral, resp. rate 20, height '5\' 8"'$  (1.727 m), weight 113.3 kg (249 lb 12.5 oz), SpO2 99 %.  Cardiovascular system: S1 & S2 heard, RRR. No JVD, murmurs, rubs, gallops or clicks. No pedal edema. Gastrointestinal system: Abdomen is nondistended, soft and nontender. No organomegaly or masses felt. Normal bowel sounds heard. Central nervous system: Alert and oriented. No focal neurological deficits. Extremities: Symmetric 5 x 5 power.2+ pitting edema to the dorsal surface of the right hand, wrist and distal forearm. There is erythema and tenderness to palpation. Warmth is appreciated. 2+ radial pulses bilaterally Skin: No rashes, lesions or ulcers Psychiatry: Judgement and insight appear normal. Mood & affect appropriate.     Follow-up Information    Follow up with pcp. Schedule an appointment as soon as possible for a visit in 1 week.   Why:  hospital follow up      Follow up with Rayvon Char, MD. Schedule an appointment as soon as possible for a visit in 1 week.   Specialty:  General Surgery   Why:  Please call office to make this appointment   Contact information:   [address] [address] [phone]       Signed: Reyne Dumas 06/09/2016, 11:00 AM        Time spent >45 mins

## 2016-06-09 NOTE — Progress Notes (Signed)
Patient dressing changed, patient to leave AMA, Explained AMA to patient, AMA papers signed, when getting patient ready to leave he decided he wanted to stay, Dr Allyson Sabal aware, new orders obtained

## 2016-06-09 NOTE — Evaluation (Signed)
Physical Therapy Evaluation Patient Details Name: Ronnie Moore MRN: [id number] DOB: 1961/08/01 Today's Date: 06/09/2016   History of Present Illness  Pt is a 55 y.o. male with a PMH significant for chronic back pain, CVA, HTN, OA, previous MRSA infection, obesity who was transferred from [facility] in [facility] due to right wrist and hand pain after falling and injuring area a week ago and low-grade fever for the past 2 days. The patient states that he has post CVA chronic circulation issues of the right upper extremity. Pt is now s/p I&D of RUE due to cellulitis.  Clinical Impression  Pt admitted with above diagnosis. Pt currently with functional limitations due to the deficits listed below (see PT Problem List). At the time of PT eval pt was able to eprform transfers and ambulation with modified independence to supervision for safety. Brother present in room and states that he could be available 24 hours for assistance if needed. Pt also has an aide coming in 5x/week for 2-3 hours/day for assistance with meals, housekeeping, etc. Pt will benefit from skilled PT to increase their independence and safety with mobility to allow discharge to the venue listed below.       Follow Up Recommendations No PT follow up;Supervision for mobility/OOB    Equipment Recommendations  None recommended by PT    Recommendations for Other Services       Precautions / Restrictions Precautions Precautions: Fall Restrictions Weight Bearing Restrictions: No      Mobility  Bed Mobility Overal bed mobility: Modified Independent             General bed mobility comments: No assist required. HOB flat and rails lowered, however pt also has a hospital bed at home for assist if needed.   Transfers Overall transfer level: Modified independent Equipment used: Straight cane             General transfer comment: Pt was able to power-up to full standing position without assistance. Use of SPC required for  balance.   Ambulation/Gait Ambulation/Gait assistance: Supervision Ambulation Distance (Feet): 225 Feet Assistive device: Straight cane Gait Pattern/deviations: Step-through pattern;Decreased stride length;Trunk flexed Gait velocity: Decreased Gait velocity interpretation: Below normal speed for age/gender General Gait Details: Pt was able to ambulate in hall with [facility] and no unsteadiness/LOB noted. Pt did not require any assistance and states he is walking near baseline.   Stairs            Wheelchair Mobility    Modified Rankin (Stroke Patients Only)       Balance Overall balance assessment: Needs assistance Sitting-balance support: Feet supported;No upper extremity supported Sitting balance-Leahy Scale: Fair     Standing balance support: No upper extremity supported;During functional activity Standing balance-Leahy Scale: Fair                               Pertinent Vitals/Pain Pain Assessment: Faces Faces Pain Scale: Hurts a little bit Pain Location: RUE Pain Descriptors / Indicators: Operative site guarding Pain Intervention(s): Limited activity within patient's tolerance;Monitored during session;Repositioned    Home Living Family/patient expects to be discharged to:: Private residence Living Arrangements: Alone Available Help at Discharge: Family;Available 24 hours/day Type of Home: Apartment Home Access: Level entry     Home Layout: One level Home Equipment: Toilet riser;Shower seat;Walker - 2 wheels;Wheelchair - manual;Hospital bed;Cane - single point      Prior Function Level of Independence: Independent with  assistive device(s)         Comments: Pt reports he used a single crutch most of the time and the RW occasionally. Independent with ADL's. Does not drive Niece takes him to the store. Aide 5 x/week for 2-3 hours a day.     Hand Dominance        Extremity/Trunk Assessment   Upper Extremity Assessment: Defer to OT  evaluation;RUE deficits/detail RUE Deficits / Details: RUE ACE wrapped s/p I&D. States he cannot use the arm due to pain however during functional activity pt reaching out for items, railings, etc.         Lower Extremity Assessment: Overall WFL for tasks assessed      Cervical / Trunk Assessment: Normal  Communication   Communication: No difficulties  Cognition Arousal/Alertness: Awake/alert Behavior During Therapy: WFL for tasks assessed/performed Overall Cognitive Status: Within Functional Limits for tasks assessed                      General Comments      Exercises        Assessment/Plan    PT Assessment Patient needs continued PT services  PT Diagnosis Acute pain   PT Problem List Decreased strength  PT Treatment Interventions DME instruction;Gait training;Stair training;Functional mobility training;Therapeutic activities;Therapeutic exercise;Neuromuscular re-education;Patient/family education   PT Goals (Current goals can be found in the Care Plan section) Acute Rehab PT Goals Patient Stated Goal: Home today PT Goal Formulation: With patient/family Time For Goal Achievement: 06/16/16 Potential to Achieve Goals: Good    Frequency Min 3X/week   Barriers to discharge        Co-evaluation               End of Session Equipment Utilized During Treatment: Gait belt Activity Tolerance: Patient tolerated treatment well Patient left: in bed;with call bell/phone within reach;with nursing/sitter in room;with family/visitor present Nurse Communication: Mobility status         Time: [id number] PT Time Calculation (min) (ACUTE ONLY): 39 min   Charges:   PT Evaluation $PT Eval Moderate Complexity: 1 Procedure PT Treatments $Gait Training: 23-37 mins   PT G Codes:        Rolinda Roan 06-25-16, 12:27 PM  Rolinda Roan, PT, DPT Acute Rehabilitation Services Pager: [phone]

## 2016-06-10 MED ORDER — SULFAMETHOXAZOLE-TRIMETHOPRIM 800-160 MG PO TABS: 1.0000 | ORAL_TABLET | Freq: Two times a day (BID) | ORAL | Status: DC

## 2016-06-10 NOTE — Evaluation (Signed)
Occupational Therapy Evaluation Patient Details Name: Ronnie Moore MRN: [id number] DOB: Apr 07, 1961 Today's Date: 06/10/2016    History of Present Illness Pt is a 55 y.o. male with a PMH significant for chronic back pain, CVA, HTN, OA, previous MRSA infection, obesity who was transferred from [facility] in [facility] due to right wrist and hand pain after falling and injuring area a week ago and low-grade fever for the past 2 days. The patient states that he has post CVA chronic circulation issues of the right upper extremity. Pt is now s/p I&D of RUE due to cellulitis.   Clinical Impression   Pt is a 55 y/o male who demonstrated modified independence in ADL. (please see below) Pt provided with education in elevation of affected UE and strategies for care, safety, and sequencing for ADL. Pt has appropriate DME and AE at home from previous hospitalizations. No OT follow up.    Follow Up Recommendations  No OT follow up    Equipment Recommendations  None recommended by OT    Recommendations for Other Services       Precautions / Restrictions Precautions Precautions: Fall Restrictions Weight Bearing Restrictions: No      Mobility Bed Mobility Overal bed mobility: Modified Independent             General bed mobility comments: No assist required. HOB raised, used hand rails. Pt reports that he has a hospital bed at home he can use.  Transfers Overall transfer level: Modified independent Equipment used: None             General transfer comment: Pt able to power-up to full standing without SPC    Balance Overall balance assessment: Needs assistance Sitting-balance support: Feet supported;No upper extremity supported Sitting balance-Leahy Scale: Fair     Standing balance support: No upper extremity supported Standing balance-Leahy Scale: Fair                              ADL Overall ADL's : Modified independent                                        General ADL Comments: able to don/doff shirt, socks, shorts (elastic), manage undergarmets during toileting, wash hand, wash face (including wet and squeeze washcloth - operating sink)     Vision     Perception     Praxis      Pertinent Vitals/Pain Pain Assessment: 0-10 Pain Score: 9  Pain Location: R hand/wrist Pain Descriptors / Indicators: Aching Pain Intervention(s): Monitored during session;Premedicated before session     Hand Dominance Right   Extremity/Trunk Assessment Upper Extremity Assessment Upper Extremity Assessment: RUE deficits/detail RUE Deficits / Details: R UE ace wrapped from PIPs to proximal to forearm, shoulder and elbow  ROM WFL RUE: Unable to fully assess due to immobilization RUE Coordination: decreased fine motor   Lower Extremity Assessment Lower Extremity Assessment: Overall WFL for tasks assessed       Communication Communication Communication: No difficulties   Cognition Arousal/Alertness: Awake/alert Behavior During Therapy: WFL for tasks assessed/performed Overall Cognitive Status: Within Functional Limits for tasks assessed                     General Comments       Exercises       Shoulder Instructions  Home Living Family/patient expects to be discharged to:: Private residence Living Arrangements: Alone Available Help at Discharge: Family;Available 24 hours/day;Personal care attendant (aide 5 days a week for 2-3 hours) Type of Home: Apartment Home Access: Level entry     Home Layout: One level     Bathroom Shower/Tub: Tub/shower unit Shower/tub characteristics: Architectural technologist: Standard Bathroom Accessibility: Yes How Accessible: Accessible via walker Home Equipment: Toilet riser;Shower seat;Walker - 2 wheels;Hospital bed;Cane - single point;Tub bench;Wheelchair - power          Prior Functioning/Environment Level of Independence: Needs assistance    ADL's / Homemaking  Assistance Needed: aide assists with LB bathing/dressing, meals and housekeeping   Comments: walking with a cane or using a power w/c, does not drive, niece assists with groceries    OT Diagnosis: Acute pain   OT Problem List: Decreased range of motion;Decreased safety awareness;Decreased knowledge of precautions;Impaired UE functional use;Pain;Increased edema   OT Treatment/Interventions:      OT Goals(Current goals can be found in the care plan section) Acute Rehab OT Goals Patient Stated Goal: To go home OT Goal Formulation: With patient Time For Goal Achievement: 06/17/16  OT Frequency:     Barriers to D/C:            Co-evaluation              End of Session Nurse Communication: Other (comment) (Pt requests removal of IV line in preparation for discharge)  Activity Tolerance: Patient tolerated treatment well Patient left: in bed;with call bell/phone within reach;with family/visitor present   Time: [id number] OT Time Calculation (min): 27 min Charges:  OT General Charges $OT Visit: 1 Procedure OT Evaluation $OT Eval Low Complexity: 1 Procedure OT Treatments $Self Care/Home Management : 8-22 mins G-Codes:    Merri Ray Stayce Delancy 06/14/2016, 11:54 AM

## 2016-06-10 NOTE — Progress Notes (Signed)
PT Cancellation Note  Patient Details Name: Ronnie Moore MRN: [id number] DOB: 1961-10-24   Cancelled Treatment:    Reason Eval/Treat Not Completed: Other (comment) (Pt to d/c wishing to rest, per last note patient appears at baseline.  )   Julie Nay J Andres 06/10/2016, 12:03 PM Ronnie Moore, PTA pager [phone]

## 2016-06-10 NOTE — Progress Notes (Signed)
Dressing change done; Xerofoam, 4x4, kerlex, and compression wrap.  Patient managed dressing changes with pain rate 10/10.  Scheduled Oxy IR was given at time of dressing change.

## 2016-06-10 NOTE — Care Management Note (Signed)
Case Management Note  Patient Details  Name: Ronnie Moore MRN: [id number] Date of Birth: [birth year]  Subjective/Objective:   56 yr old male s/p I & D right wrist.                  Action/Plan: Referral for Home Health RN called to Pietro Cassis, [facility] Liaison.    Expected Discharge Date:    06/10/16             Expected Discharge Plan:  [place]  In-House Referral:     Discharge planning Services  CM Consult  Post Acute Care Choice:  Home Health Choice offered to:  Patient  DME Arranged:  N/A DME Agency:  NA  HH Arranged:  RN [place] Agency:  [facility]  Status of Service:  Completed, signed off  If discussed at [place] of Stay Meetings, dates discussed:    Additional Comments:  Ninfa Meeker, RN 06/10/2016, 12:55 PM

## 2016-06-10 NOTE — Progress Notes (Signed)
Orthopedic Tech Progress Note Patient Details:  Ronnie Moore 1961/02/23 [id number]  Ortho Devices Type of Ortho Device: Arm sling Ortho Device/Splint Interventions: Application   [place] Pink 06/10/2016, 2:26 PM

## 2016-06-10 NOTE — Discharge Summary (Addendum)
Physician Discharge Summary  Ronnie Moore MRN: [id number] DOB/AGE: 53-May-1962 53 y.o.  PCP: No primary care provider on file.   Admit date: 06/06/2016 Discharge date: 06/10/2016  Discharge Diagnoses:     Principal Problem:   Cellulitis of right upper extremity Active Problems:   Hypokalemia   Essential hypertension   Abnormal LFTs   Chronic back pain    Follow-up recommendations Follow-up with PCP in 3-5 days , including all  additional recommended appointments as below Follow-up CBC, CMP in 3-5 days Patient to follow-up with  Knute Neu, MD in the next 1-2 weeks   Note is that the patient wanted to  leave  [facility] as he did not want to comply with my recommendations as far as his pain regimen is concerned, and despite the fact that the patient had a witnessed fall. Patient kept saying that he did not fall and he did not look oversedated yesterday at the time of the fall. Notified the patient that he had to be given Narcan to help him with his somnolence and mentation at the time of the fall. Patient is not agreeable to decrease the dose of his oxycodone, continues to request IV Dilaudid in the hospital. Patient declines SNF. Discussed with the patient that he is not safe to be discharged to home with all these pain medications as she has already had one fall at home, and one fall in the hospital. Patient declined x-rays to rule out hip fracture. Patient declined bed alarm. Patient refused to be compliant with nursing recommendations. This discussion was completed in the presence of the nurse Tresa Endo at the time of discharge.  Finally the patient changed his mind and decided to stay 1 more day and be compliant with our recommendations, but insisted to continue his home meds the way he has been taking them    Current Discharge Medication List    START taking these medications   Details  docusate sodium (COLACE) 50 MG capsule Take 1 capsule (50 mg total) by mouth 2  (two) times daily. Qty: 10 capsule, Refills: 0    nicotine (NICODERM CQ - DOSED IN MG/24 HOURS) 21 mg/24hr patch Place 1 patch (21 mg total) onto the skin daily. Qty: 28 patch, Refills: 0    pantoprazole (PROTONIX) 40 MG tablet Take 1 tablet (40 mg total) by mouth daily. Qty: 30 tablet, Refills: 1    polyethylene glycol (MIRALAX / GLYCOLAX) packet Take 17 g by mouth 2 (two) times daily. Qty: 14 each, Refills: 0    potassium chloride 20 MEQ/15ML (10%) SOLN Take 30 mLs (40 mEq total) by mouth daily. Qty: 900 mL, Refills: 0    sulfamethoxazole-trimethoprim (BACTRIM DS,SEPTRA DS) 800-160 MG tablet Take 1 tablet by mouth every 12 (twelve) hours. Qty: 20 tablet, Refills: 0    temazepam (RESTORIL) 15 MG capsule Take 1 capsule (15 mg total) by mouth at bedtime as needed for sleep. Qty: 30 capsule, Refills: 0      CONTINUE these medications which have NOT CHANGED   Details  Olmesartan-Amlodipine-HCTZ (TRIBENZOR) 40-10-25 MG TABS Take 1 tablet by mouth daily.    Oxycodone HCl 20 MG TABS Take 20 mg by mouth every 4 (four) hours. Refills: 0    LORazepam (ATIVAN) 2 MG tablet Take 1 tablet (2 mg total) by mouth 3 (three) times daily. Qty: 12 tablet, Refills: 0    meloxicam (MOBIC) 15 MG tablet Take 1 tablet (15 mg total) by mouth daily. Qty: 6 tablet, Refills: 0  STOP taking these medications     ibuprofen (ADVIL,MOTRIN) 800 MG tablet      morphine (AVINZA) 30 MG 24 hr capsule              Discharge Condition:       Discharge Instructions Get Medicines reviewed and adjusted: Please take all your medications with you for your next visit with your Primary MD  Please request your Primary MD to go over all hospital tests and procedure/radiological results at the follow up, please ask your Primary MD to get all Hospital records sent to his/her office.  If you experience worsening of your admission symptoms, develop shortness of breath, life threatening emergency, suicidal  or homicidal thoughts you must seek medical attention immediately by calling 911 or calling your MD immediately if symptoms less severe.  You must read complete instructions/literature along with all the possible adverse reactions/side effects for all the Medicines you take and that have been prescribed to you. Take any new Medicines after you have completely understood and accpet all the possible adverse reactions/side effects.   Do not drive when taking Pain medications.   Do not take more than prescribed Pain, Sleep and Anxiety Medications  Special Instructions: If you have smoked or chewed Tobacco in the last 2 yrs please stop smoking, stop any regular Alcohol and or any Recreational drug use.  Wear Seat belts while driving.  Please note  You were cared for by a hospitalist during your hospital stay. Once you are discharged, your primary care physician will handle any further medical issues. Please note that NO REFILLS for any discharge medications will be authorized once you are discharged, as it is imperative that you return to your primary care physician (or establish a relationship with a primary care physician if you do not have one) for your aftercare needs so that they can reassess your need for medications and monitor your lab values.     Allergies  Allergen Reactions  . Metaxalone Swelling    Reaction to Skelaxin - lips swelled      Disposition: Home with RN for wound care   Consults:  Hand surgery   Significant Diagnostic Studies:  Dg Wrist Complete Right  06/06/2016  CLINICAL DATA:  Status post fall, with right wrist pain. Initial encounter. EXAM: RIGHT WRIST - COMPLETE 3+ VIEW COMPARISON:  Right forearm radiographs from 06/19/2013 FINDINGS: There is no evidence of acute fracture or dislocation. There appears to be mild chronic deformity involving the distal scaphoid, possibly reflecting remote injury. The carpal rows are intact, and demonstrate normal alignment.  The joint spaces are preserved. Diffuse dorsal soft tissue swelling is noted along the wrist and hand. IMPRESSION: 1. No evidence of fracture or dislocation. 2. Apparent mild chronic deformity involving the distal scaphoid. 3. Diffuse dorsal soft tissue swelling noted along the wrist and hand. Electronically Signed   By: Roanna Raider M.D.   On: 06/06/2016 16:25        Filed Weights   06/06/16 1516 06/06/16 2058  Weight: 113.399 kg (250 lb) 113.3 kg (249 lb 12.5 oz)     Microbiology: Recent Results (from the past 240 hour(s))  MRSA PCR Screening     Status: Abnormal   Collection Time: 06/07/16 12:20 AM  Result Value Ref Range Status   MRSA by PCR POSITIVE (A) NEGATIVE Final    Comment:        The GeneXpert MRSA Assay (FDA approved for NASAL specimens only), is one component of a  comprehensive MRSA colonization surveillance program. It is not intended to diagnose MRSA infection nor to guide or monitor treatment for MRSA infections. RESULT CALLED TO, READ BACK BY AND VERIFIED WITH: WHITEHORN,S RN [phone] AT 0228 SKEEN,P   Aerobic/Anaerobic Culture (surgical/deep wound)     Status: None (Preliminary result)   Collection Time: 06/07/16  2:42 PM  Result Value Ref Range Status   Specimen Description FLUID SYNOVIAL RIGHT WRIST  Final   Special Requests SWAB  Final   Gram Stain   Final    RARE WBC PRESENT, PREDOMINANTLY PMN NO ORGANISMS SEEN    Culture   Final    CULTURE REINCUBATED FOR BETTER GROWTH NO ANAEROBES ISOLATED; CULTURE IN PROGRESS FOR 5 DAYS    Report Status PENDING  Incomplete       Blood Culture    Component Value Date/Time   SDES FLUID SYNOVIAL RIGHT WRIST 06/07/2016 1442   SPECREQUEST SWAB 06/07/2016 1442   CULT  06/07/2016 1442    CULTURE REINCUBATED FOR BETTER GROWTH NO ANAEROBES ISOLATED; CULTURE IN PROGRESS FOR 5 DAYS    REPTSTATUS PENDING 06/07/2016 1442      Labs: Results for orders placed or performed during the hospital encounter of  06/06/16 (from the past 48 hour(s))  CBC WITH DIFFERENTIAL     Status: None   Collection Time: 06/09/16  3:21 AM  Result Value Ref Range   WBC 5.8 4.0 - 10.5 K/uL   RBC 4.34 4.22 - 5.81 MIL/uL   Hemoglobin 13.2 13.0 - 17.0 g/dL   HCT 39.5 39.0 - 52.0 %   MCV 91.0 78.0 - 100.0 fL   MCH 30.4 26.0 - 34.0 pg   MCHC 33.4 30.0 - 36.0 g/dL   RDW 13.0 11.5 - 15.5 %   Platelets 276 150 - 400 K/uL   Neutrophils Relative % 54 %   Neutro Abs 3.1 1.7 - 7.7 K/uL   Lymphocytes Relative 34 %   Lymphs Abs 2.0 0.7 - 4.0 K/uL   Monocytes Relative 9 %   Monocytes Absolute 0.5 0.1 - 1.0 K/uL   Eosinophils Relative 3 %   Eosinophils Absolute 0.2 0.0 - 0.7 K/uL   Basophils Relative 0 %   Basophils Absolute 0.0 0.0 - 0.1 K/uL  Comprehensive metabolic panel     Status: Abnormal   Collection Time: 06/09/16  3:21 AM  Result Value Ref Range   Sodium 139 135 - 145 mmol/L   Potassium 3.7 3.5 - 5.1 mmol/L   Chloride 105 101 - 111 mmol/L   CO2 25 22 - 32 mmol/L   Glucose, Bld 100 (H) 65 - 99 mg/dL   BUN 6 6 - 20 mg/dL   Creatinine, Ser 0.92 0.61 - 1.24 mg/dL   Calcium 8.6 (L) 8.9 - 10.3 mg/dL   Total Protein 6.3 (L) 6.5 - 8.1 g/dL   Albumin 2.8 (L) 3.5 - 5.0 g/dL   AST 36 15 - 41 U/L   ALT 49 17 - 63 U/L   Alkaline Phosphatase 63 38 - 126 U/L   Total Bilirubin 0.6 0.3 - 1.2 mg/dL   GFR calc non Af Amer >60 >60 mL/min   GFR calc Af Amer >60 >60 mL/min    Comment: (NOTE) The eGFR has been calculated using the CKD EPI equation. This calculation has not been validated in all clinical situations. eGFR's persistently <60 mL/min signify possible Chronic Kidney Disease.    Anion gap 9 5 - 15  Vancomycin, trough     Status:  None   Collection Time: 06/09/16 10:55 PM  Result Value Ref Range   Vancomycin Tr 18 15 - 20 ug/mL     Lipid Panel  No results found for: CHOL, TRIG, HDL, CHOLHDL, VLDL, LDLCALC, LDLDIRECT   No results found for: HGBA1C   Lab Results  Component Value Date   CREATININE 0.92  06/09/2016     HPI :   Ronnie Moore is a 55 y.o. male with medical history significant of chronic back pain, CVA, hypertension, osteoarthritis, previous MRSA infection, obesity who was transferred from [facility] due to right wrist and hand pain after falling and injuring area a week ago and low-grade fever for the past 2 days. The patient states that he has post CVA chronic circulation issues of the right upper extremity.  ED Course: Workup revealed hypokalemia of 3.3 mmol per liter, elevated CRP and sedimentation rate and right wrist radiograph showing soft tissue swelling of the wrist and hand. The patient received IV antibiotics and analgesics. He was transferred to [facility] for further treatment and evaluation.   HOSPITAL COURSE:   Cellulitis of right upper extremity Status post aspiration of the right wrist joint and incision and drainage of the right wrist extensor tendon sheath on 7/3   Discontinued  IV Dilaudid, however patient noted to be extremely noncompliant and when he gets somnolent, he tries to get out of the bed and fall We recommended to cut back on IV and oral narcotics. Patient declined initially but later complied Received IV antibiotics. Vancomycin, Zosyn 3 days , pending culture results and Gram stain results from Aspiration of the right wrist, patient is MRSA positive appreciate Dr. Izora Ribas Input , called his office again on 7/5 to follow-up on the wound as it was oozing blood yesterday, also to make wound care recommendations and recommend a follow-up plan of care Suspect patient has right upper extremity swelling both secondary to cellulitis as well as his prior CVA Follow wound culture data shows no growth so far, recommend that patient will be discharged on Bactrim for 10 more days. Uric acid 6.1 Home health RN for dressing changes, follow-up with hand surgery in the clinic   Fall due to noncompliance with nursing  recommendations Patient fell yesterday on his bottom, did not hit his head Has declined x-rays yesterday, declined CT scan today If the patient declines, would need to get him up to assess mobility to determine if the patient is able to go back home PT OT evaluation Called patient's mother, who stated that the patient's daughter does not communicate with her father, patient's sister makes all the decisions for him, her name is Gavin Pound and she can be reached at [phone] Requested social work to evaluate home situation and see if she is safe to be discharged back to his home Patient's brother was in the room at the time we had a long discussion about the patient's pain medications contributing to the fall. Patient became anxious tearful and requested to go home    Hypokalemia -Repleted  Likely due to the use of hydrochlorothiazide without potassium supplementation. Potassium repleted prior to discharge     Essential hypertension Continue Avapro 300 mg by mouth daily. Continue amlodipine 10 mg by mouth daily. Monitor blood pressure.   Abnormal LFTs , positive hepatitis C antibody  States he only drinks alcohol in small amounts very sporadically. History of hepatitis B per patient. He believes it is non-active hep B. Hepatitis C antibody positive  HIV nonreactive   Chronic back pain Continue oxycodone 20 reduced to 10 mg mg by mouth every 4 hours when necessary. Patient extremely unhappy about this and would like to go home reduced Lorazepam from 2 mg to 0.5 mg by mouth 3 times a day when necessary as needed for back spasms.due to oversedation Pt received 1 dose of narcan due to oversedation  Patient not safe to live at home alone with such high doses of chronic narcotics, this was discussed with the patient and his brother and mother.        Discharge Exam:   Blood pressure 124/70, pulse 84, temperature 98.2 F (36.8 C), temperature source Oral, resp. rate 18, height _0   (1.727 m), weight 113.3 kg (249 lb 12.5 oz), SpO2 95 %.  Cardiovascular system: S1 & S2 heard, RRR. No JVD, murmurs, rubs, gallops or clicks. No pedal edema. Gastrointestinal system: Abdomen is nondistended, soft and nontender. No organomegaly or masses felt. Normal bowel sounds heard. Central nervous system: Alert and oriented. No focal neurological deficits. Extremities: Symmetric 5 x 5 power.2+ pitting edema to the dorsal surface of the right hand, wrist and distal forearm. There is erythema and tenderness to palpation. Warmth is appreciated. 2+ radial pulses bilaterally Skin: No rashes, lesions or ulcers Psychiatry: Judgement and insight appear normal. Mood & affect appropriate.     Follow-up Information    Follow up with pcp. Schedule an appointment as soon as possible for a visit in 1 week.   Why:  hospital follow up      Follow up with Rayvon Char, MD. Schedule an appointment as soon as possible for a visit in 1 week.   Specialty:  General Surgery   Why:  Please call office to make this appointment   Contact information:   [address] [place] [address] [phone]       Signed: Reyne Dumas 06/10/2016, 10:37 AM        Time spent >45 mins

## 2016-06-13 LAB — AEROBIC/ANAEROBIC CULTURE W GRAM STAIN (SURGICAL/DEEP WOUND): Culture: NO GROWTH

## 2016-06-13 LAB — AEROBIC/ANAEROBIC CULTURE (SURGICAL/DEEP WOUND)

## 2017-11-03 ENCOUNTER — Other Ambulatory Visit: Payer: Self-pay | Admitting: Orthopedic Surgery

## 2017-11-03 DIAGNOSIS — M542 Cervicalgia: Secondary | ICD-10-CM

## 2017-11-03 DIAGNOSIS — M545 Low back pain: Secondary | ICD-10-CM

## 2017-11-15 ENCOUNTER — Other Ambulatory Visit: Payer: Medicare Other

## 2017-11-15 ENCOUNTER — Inpatient Hospital Stay
Admission: RE | Admit: 2017-11-15 | Discharge: 2017-11-15 | Disposition: A | Discharge status: Home / Self Care | Payer: Medicare Other | Source: Ambulatory Visit | Attending: Orthopedic Surgery | Admitting: Orthopedic Surgery

## 2017-12-01 ENCOUNTER — Inpatient Hospital Stay
Admission: RE | Admit: 2017-12-01 | Discharge: 2017-12-01 | Disposition: A | Discharge status: Home / Self Care | Payer: Medicare Other | Source: Ambulatory Visit | Attending: Orthopedic Surgery | Admitting: Orthopedic Surgery

## 2017-12-01 ENCOUNTER — Other Ambulatory Visit: Payer: Medicare Other

## 2017-12-09 ENCOUNTER — Ambulatory Visit
Admission: RE | Admit: 2017-12-09 | Discharge: 2017-12-09 | Disposition: A | Discharge status: Home / Self Care | Payer: Medicare Other | Source: Ambulatory Visit | Attending: Orthopedic Surgery | Admitting: Orthopedic Surgery

## 2017-12-09 DIAGNOSIS — M545 Low back pain: Secondary | ICD-10-CM

## 2017-12-09 DIAGNOSIS — M542 Cervicalgia: Secondary | ICD-10-CM

## 2019-02-20 ENCOUNTER — Emergency Department (HOSPITAL_BASED_OUTPATIENT_CLINIC_OR_DEPARTMENT_OTHER)
Admission: EM | Admit: 2019-02-20 | Discharge: 2019-02-20 | Disposition: A | Discharge status: Home / Self Care | Payer: Medicare Other | Attending: Emergency Medicine | Admitting: Emergency Medicine

## 2019-02-20 ENCOUNTER — Encounter (HOSPITAL_BASED_OUTPATIENT_CLINIC_OR_DEPARTMENT_OTHER): Payer: Self-pay | Admitting: Emergency Medicine

## 2019-02-20 ENCOUNTER — Other Ambulatory Visit: Payer: Self-pay

## 2019-02-20 DIAGNOSIS — I10 Essential (primary) hypertension: Secondary | ICD-10-CM | POA: Insufficient documentation

## 2019-02-20 DIAGNOSIS — R5383 Other fatigue: Secondary | ICD-10-CM | POA: Diagnosis present

## 2019-02-20 DIAGNOSIS — G8929 Other chronic pain: Secondary | ICD-10-CM | POA: Diagnosis not present

## 2019-02-20 DIAGNOSIS — Z79899 Other long term (current) drug therapy: Secondary | ICD-10-CM | POA: Insufficient documentation

## 2019-02-20 DIAGNOSIS — R531 Weakness: Secondary | ICD-10-CM | POA: Diagnosis not present

## 2019-02-20 DIAGNOSIS — M545 Low back pain: Secondary | ICD-10-CM | POA: Diagnosis not present

## 2019-02-20 DIAGNOSIS — F17200 Nicotine dependence, unspecified, uncomplicated: Secondary | ICD-10-CM | POA: Diagnosis not present

## 2019-02-20 HISTORY — DX: Malignant (primary) neoplasm, unspecified: C80.1

## 2019-02-20 LAB — COMPREHENSIVE METABOLIC PANEL
ALT: 14 U/L (ref 0–44)
ANION GAP: 6 (ref 5–15)
AST: 18 U/L (ref 15–41)
Albumin: 3.6 g/dL (ref 3.5–5.0)
Alkaline Phosphatase: 78 U/L (ref 38–126)
BILIRUBIN TOTAL: 0.4 mg/dL (ref 0.3–1.2)
BUN: 16 mg/dL (ref 6–20)
CO2: 28 mmol/L (ref 22–32)
Calcium: 9 mg/dL (ref 8.9–10.3)
Chloride: 102 mmol/L (ref 98–111)
Creatinine, Ser: 0.83 mg/dL (ref 0.61–1.24)
GFR calc Af Amer: >60 mL/min (ref >60)
GFR calc non Af Amer: >60 mL/min (ref >60)
Glucose, Bld: 87 mg/dL (ref 70–99)
POTASSIUM: 4 mmol/L (ref 3.5–5.1)
Sodium: 136 mmol/L (ref 135–145)
TOTAL PROTEIN: 7.2 g/dL (ref 6.5–8.1)

## 2019-02-20 LAB — CBC
HEMATOCRIT: 43.8 % (ref 39.0–52.0)
Hemoglobin: 14 g/dL (ref 13.0–17.0)
MCH: 29.7 pg (ref 26.0–34.0)
MCHC: 32 g/dL (ref 30.0–36.0)
MCV: 93 fL (ref 80.0–100.0)
Platelets: 271 10*3/uL (ref 150–400)
RBC: 4.71 MIL/uL (ref 4.22–5.81)
RDW: 14.6 % (ref 11.5–15.5)
WBC: 6.4 10*3/uL (ref 4.0–10.5)
nRBC: 0 % (ref 0.0–0.2)

## 2019-02-20 LAB — URINALYSIS, ROUTINE W REFLEX MICROSCOPIC
Bilirubin Urine: NEGATIVE
Glucose, UA: NEGATIVE mg/dL
Hgb urine dipstick: NEGATIVE
Ketones, ur: NEGATIVE mg/dL
Leukocytes,Ua: NEGATIVE
Nitrite: NEGATIVE
Protein, ur: NEGATIVE mg/dL
Specific Gravity, Urine: 1.015 (ref 1.005–1.030)
pH: 6.5 (ref 5.0–8.0)

## 2019-02-20 LAB — LIPASE, BLOOD: Lipase: 26 U/L (ref 11–51)

## 2019-02-20 MED ORDER — OXYCODONE-ACETAMINOPHEN 5-325 MG PO TABS
1.0000 | ORAL_TABLET | Freq: Once | ORAL | Status: AC
  Administered 2019-02-20: 1 via ORAL
  Filled 2019-02-20: qty 1

## 2019-02-20 NOTE — ED Triage Notes (Signed)
Increasing fatigue and weakness over the past week.

## 2019-02-20 NOTE — ED Notes (Signed)
Pt placed on monitor.  

## 2019-02-20 NOTE — ED Provider Notes (Signed)
[facility] EMERGENCY DEPARTMENT Provider Note   CSN: [id number] Arrival date & time: 02/20/19  1159    History   Chief Complaint Chief Complaint  Patient presents with  . Fatigue    HPI Ronnie Moore is a 58 y.o. male.     HPI Patient is a 59 year old male who presents the emergency department with generalized weakness and fatigue over the past week.  He has a history of prior spinal infarct and paraplegia secondary to this.  He has a history of chronic low back pain and reports he is out of his oxycodone.  He was supposed to have a visit with his physician today but his insurance has lapsed and thus he was brought to the emergency department by EMS.  Denies fevers and chills.  No productive cough.  Denies significant abdominal pain.  Reports lower abdominal discomfort.  No dysuria.  No other current complaints at this time.  Pain in his low back is moderate in severity and consistent with his chronic low back pain per the patient.   Past Medical History:  Diagnosis Date  . Chronic back pain   . Hypertension   . Stroke [facility])     Patient Active Problem List   Diagnosis Date Noted  . Cellulitis 06/06/2016  . Cellulitis of right upper extremity 06/06/2016  . Hypokalemia 06/06/2016  . Essential hypertension 06/06/2016  . Abnormal LFTs 06/06/2016  . Chronic back pain 06/06/2016    Past Surgical History:  Procedure Laterality Date  . BACK SURGERY    . I&D EXTREMITY Right 06/07/2016   Procedure: IRRIGATION AND DEBRIDEMENT WRIST;  Surgeon: Dayna Barker, MD;  Location: [facility];  Service: Plastics;  Laterality: Right;  . KNEE SURGERY          Home Medications    Prior to Admission medications   Medication Sig Start Date End Date Taking? Authorizing Provider  docusate sodium (COLACE) 50 MG capsule Take 1 capsule (50 mg total) by mouth 2 (two) times daily. 06/09/16   Reyne Dumas, MD  LORazepam (ATIVAN) 2 MG tablet Take 1 tablet (2 mg total) by mouth 3 (three)  times daily. Patient not taking: Reported on 06/06/2016 08/30/12   Leonard Schwartz, MD  meloxicam (MOBIC) 15 MG tablet Take 1 tablet (15 mg total) by mouth daily. Patient not taking: Reported on 06/06/2016 08/30/12   Leonard Schwartz, MD  nicotine (NICODERM CQ - DOSED IN MG/24 HOURS) 21 mg/24hr patch Place 1 patch (21 mg total) onto the skin daily. 06/09/16   Reyne Dumas, MD  Olmesartan-Amlodipine-HCTZ (TRIBENZOR) 40-10-25 MG TABS Take 1 tablet by mouth daily.    [provider]  Oxycodone HCl 20 MG TABS Take 20 mg by mouth every 4 (four) hours. 05/24/16   [provider]  pantoprazole (PROTONIX) 40 MG tablet Take 1 tablet (40 mg total) by mouth daily. 06/09/16   Reyne Dumas, MD  polyethylene glycol (MIRALAX / GLYCOLAX) packet Take 17 g by mouth 2 (two) times daily. 06/09/16   Reyne Dumas, MD  potassium chloride 20 MEQ/15ML (10%) SOLN Take 30 mLs (40 mEq total) by mouth daily. 06/09/16   Reyne Dumas, MD  sulfamethoxazole-trimethoprim (BACTRIM DS,SEPTRA DS) 800-160 MG tablet Take 1 tablet by mouth every 12 (twelve) hours. 06/10/16   Reyne Dumas, MD  temazepam (RESTORIL) 15 MG capsule Take 1 capsule (15 mg total) by mouth at bedtime as needed for sleep. 06/09/16   Reyne Dumas, MD    Family History Family History  Problem Relation  Age of Onset  . Arthritis Mother   . Hypertension Father   . Heart disease Father   . Diabetes Mellitus II Sister     Social History Social History   Tobacco Use  . Smoking status: Current Every Day Smoker    Packs/day: 0.50  . Smokeless tobacco: Never Used  Substance Use Topics  . Alcohol use: Not Currently    Comment: occ  . Drug use: No     Allergies   Metaxalone   Review of Systems Review of Systems  All other systems reviewed and are negative.    Physical Exam Updated Vital Signs BP (!) 142/79 (BP Location: Right Arm)   Pulse 66   Temp 98.6 F (37 C) (Oral)   Resp 16   Ht 5\' 8"  (1.727 m)   Wt 131.5 kg   SpO2 97%   BMI 44.09  kg/m   Physical Exam Vitals signs and nursing note reviewed.  Constitutional:      Appearance: He is well-developed.  HENT:     Head: Normocephalic and atraumatic.  Neck:     Musculoskeletal: Normal range of motion.  Cardiovascular:     Rate and Rhythm: Normal rate and regular rhythm.     Heart sounds: Normal heart sounds.  Pulmonary:     Effort: Pulmonary effort is normal. No respiratory distress.     Breath sounds: Normal breath sounds.  Abdominal:     General: There is no distension.     Palpations: Abdomen is soft.     Tenderness: There is no abdominal tenderness.  Musculoskeletal: Normal range of motion.     Comments: No thoracic or lumbar point tenderness.  Mild paralumbar tenderness with no skin changes overlying the low back or sacrum.  Skin:    General: Skin is warm and dry.  Neurological:     Mental Status: He is alert and oriented to person, place, and time.  Psychiatric:        Judgment: Judgment normal.      ED Treatments / Results  Labs (all labs ordered are listed, but only abnormal results are displayed) Labs Reviewed  URINE CULTURE  CBC  COMPREHENSIVE METABOLIC PANEL  LIPASE, BLOOD  URINALYSIS, ROUTINE W REFLEX MICROSCOPIC    EKG EKG Interpretation  Date/Time:  Tuesday February 20 2019 12:10:05 EDT Ventricular Rate:  67 PR Interval:    QRS Duration: 95 QT Interval:  365 QTC Calculation: 386 R Axis:   71 Text Interpretation:  Sinus rhythm Probable left atrial enlargement Abnormal T waves and st segments No significant change was found as compared to ecg on May 2012 Confirmed by Jola Schmidt [phone]) on 02/20/2019 12:35:40 PM   Radiology No results found.  Procedures Procedures (including critical care time)  Medications Ordered in ED Medications  oxyCODONE-acetaminophen (PERCOCET/ROXICET) 5-325 MG per tablet 1 tablet (has no administration in time range)     Initial Impression / Assessment and Plan / ED Course  I have reviewed the  triage vital signs and the nursing notes.  Pertinent labs & imaging results that were available during my care of the patient were reviewed by me and considered in my medical decision making (see chart for details).        Work-up in the emergency department without significant abnormality.  Vital signs are stable.  I do not think he needs additional work-up at this time.  He is overall well-appearing.  No abdominal tenderness.  Discharged home in good condition.  Primary care follow-up.  He has a history of chronic back pain for which she is prescribed oxycodone.  He is currently out of his oxycodone.  Unfortunately because of his pain contract with his physician I am unable to prescribe chronic opioids for the patient.  The patient understands this.  He is encouraged to return to the emergency department for any new or worsening symptoms  Final Clinical Impressions(s) / ED Diagnoses   Final diagnoses:  None    ED Discharge Orders    None       Jola Schmidt, MD 02/20/19 1348

## 2019-02-21 LAB — URINE CULTURE

## 2019-03-14 DIAGNOSIS — E785 Hyperlipidemia, unspecified: Secondary | ICD-10-CM | POA: Diagnosis not present

## 2019-03-14 DIAGNOSIS — R7303 Prediabetes: Secondary | ICD-10-CM | POA: Diagnosis not present

## 2019-03-14 DIAGNOSIS — Z8673 Personal history of transient ischemic attack (TIA), and cerebral infarction without residual deficits: Secondary | ICD-10-CM | POA: Diagnosis not present

## 2019-03-14 DIAGNOSIS — G4733 Obstructive sleep apnea (adult) (pediatric): Secondary | ICD-10-CM | POA: Diagnosis not present

## 2019-03-14 DIAGNOSIS — I10 Essential (primary) hypertension: Secondary | ICD-10-CM | POA: Diagnosis not present

## 2019-03-22 DIAGNOSIS — Z743 Need for continuous supervision: Secondary | ICD-10-CM | POA: Diagnosis not present

## 2019-03-22 DIAGNOSIS — R531 Weakness: Secondary | ICD-10-CM | POA: Diagnosis not present

## 2019-03-22 DIAGNOSIS — R279 Unspecified lack of coordination: Secondary | ICD-10-CM | POA: Diagnosis not present

## 2019-03-22 DIAGNOSIS — N35914 Unspecified anterior urethral stricture, male: Secondary | ICD-10-CM | POA: Diagnosis not present

## 2019-03-22 DIAGNOSIS — R339 Retention of urine, unspecified: Secondary | ICD-10-CM | POA: Diagnosis not present

## 2019-03-22 DIAGNOSIS — G459 Transient cerebral ischemic attack, unspecified: Secondary | ICD-10-CM | POA: Diagnosis not present

## 2019-03-22 DIAGNOSIS — N390 Urinary tract infection, site not specified: Secondary | ICD-10-CM | POA: Diagnosis not present

## 2019-03-28 DIAGNOSIS — R7303 Prediabetes: Secondary | ICD-10-CM | POA: Diagnosis not present

## 2019-03-28 DIAGNOSIS — M2550 Pain in unspecified joint: Secondary | ICD-10-CM | POA: Diagnosis not present

## 2019-03-28 DIAGNOSIS — E785 Hyperlipidemia, unspecified: Secondary | ICD-10-CM | POA: Diagnosis not present

## 2019-03-28 DIAGNOSIS — Z7401 Bed confinement status: Secondary | ICD-10-CM | POA: Diagnosis not present

## 2019-03-28 DIAGNOSIS — Z8673 Personal history of transient ischemic attack (TIA), and cerebral infarction without residual deficits: Secondary | ICD-10-CM | POA: Diagnosis not present

## 2019-03-28 DIAGNOSIS — G4733 Obstructive sleep apnea (adult) (pediatric): Secondary | ICD-10-CM | POA: Diagnosis not present

## 2019-03-28 DIAGNOSIS — I10 Essential (primary) hypertension: Secondary | ICD-10-CM | POA: Diagnosis not present

## 2019-03-28 DIAGNOSIS — R5381 Other malaise: Secondary | ICD-10-CM | POA: Diagnosis not present

## 2019-03-29 DIAGNOSIS — N35914 Unspecified anterior urethral stricture, male: Secondary | ICD-10-CM | POA: Diagnosis not present

## 2019-03-29 DIAGNOSIS — I10 Essential (primary) hypertension: Secondary | ICD-10-CM | POA: Diagnosis not present

## 2019-03-29 DIAGNOSIS — Z7401 Bed confinement status: Secondary | ICD-10-CM | POA: Diagnosis not present

## 2019-03-29 DIAGNOSIS — M2550 Pain in unspecified joint: Secondary | ICD-10-CM | POA: Diagnosis not present

## 2019-03-29 DIAGNOSIS — M5489 Other dorsalgia: Secondary | ICD-10-CM | POA: Diagnosis not present

## 2019-04-03 DIAGNOSIS — M542 Cervicalgia: Secondary | ICD-10-CM | POA: Diagnosis not present

## 2019-04-03 DIAGNOSIS — G894 Chronic pain syndrome: Secondary | ICD-10-CM | POA: Diagnosis not present

## 2019-04-03 DIAGNOSIS — I10 Essential (primary) hypertension: Secondary | ICD-10-CM | POA: Diagnosis not present

## 2019-04-03 DIAGNOSIS — Z79899 Other long term (current) drug therapy: Secondary | ICD-10-CM | POA: Diagnosis not present

## 2019-04-11 DIAGNOSIS — I10 Essential (primary) hypertension: Secondary | ICD-10-CM | POA: Diagnosis not present

## 2019-04-11 DIAGNOSIS — Z8673 Personal history of transient ischemic attack (TIA), and cerebral infarction without residual deficits: Secondary | ICD-10-CM | POA: Diagnosis not present

## 2019-04-11 DIAGNOSIS — G4733 Obstructive sleep apnea (adult) (pediatric): Secondary | ICD-10-CM | POA: Diagnosis not present

## 2019-04-12 ENCOUNTER — Emergency Department (HOSPITAL_COMMUNITY)
Admission: EM | Admit: 2019-04-12 | Discharge: 2019-04-13 | Disposition: A | Discharge status: Home / Self Care | Payer: Medicare Other | Attending: Emergency Medicine | Admitting: Emergency Medicine

## 2019-04-12 ENCOUNTER — Encounter (HOSPITAL_COMMUNITY): Payer: Self-pay | Admitting: Emergency Medicine

## 2019-04-12 DIAGNOSIS — Y999 Unspecified external cause status: Secondary | ICD-10-CM | POA: Insufficient documentation

## 2019-04-12 DIAGNOSIS — M5489 Other dorsalgia: Secondary | ICD-10-CM | POA: Diagnosis not present

## 2019-04-12 DIAGNOSIS — W19XXXA Unspecified fall, initial encounter: Secondary | ICD-10-CM | POA: Diagnosis not present

## 2019-04-12 DIAGNOSIS — M545 Low back pain, unspecified: Secondary | ICD-10-CM

## 2019-04-12 DIAGNOSIS — Z79899 Other long term (current) drug therapy: Secondary | ICD-10-CM | POA: Insufficient documentation

## 2019-04-12 DIAGNOSIS — R4020 Unspecified coma: Secondary | ICD-10-CM | POA: Diagnosis not present

## 2019-04-12 DIAGNOSIS — W050XXA Fall from non-moving wheelchair, initial encounter: Secondary | ICD-10-CM | POA: Diagnosis not present

## 2019-04-12 DIAGNOSIS — F17210 Nicotine dependence, cigarettes, uncomplicated: Secondary | ICD-10-CM | POA: Insufficient documentation

## 2019-04-12 DIAGNOSIS — M542 Cervicalgia: Secondary | ICD-10-CM | POA: Insufficient documentation

## 2019-04-12 DIAGNOSIS — G8220 Paraplegia, unspecified: Secondary | ICD-10-CM | POA: Insufficient documentation

## 2019-04-12 DIAGNOSIS — I10 Essential (primary) hypertension: Secondary | ICD-10-CM | POA: Diagnosis not present

## 2019-04-12 DIAGNOSIS — Y929 Unspecified place or not applicable: Secondary | ICD-10-CM | POA: Diagnosis not present

## 2019-04-12 DIAGNOSIS — R51 Headache: Secondary | ICD-10-CM | POA: Insufficient documentation

## 2019-04-12 DIAGNOSIS — S0990XA Unspecified injury of head, initial encounter: Secondary | ICD-10-CM | POA: Diagnosis not present

## 2019-04-12 DIAGNOSIS — Y939 Activity, unspecified: Secondary | ICD-10-CM | POA: Diagnosis not present

## 2019-04-12 DIAGNOSIS — I517 Cardiomegaly: Secondary | ICD-10-CM | POA: Diagnosis not present

## 2019-04-12 DIAGNOSIS — S199XXA Unspecified injury of neck, initial encounter: Secondary | ICD-10-CM | POA: Diagnosis not present

## 2019-04-12 DIAGNOSIS — S3992XA Unspecified injury of lower back, initial encounter: Secondary | ICD-10-CM | POA: Diagnosis not present

## 2019-04-12 NOTE — ED Provider Notes (Signed)
[facility] EMERGENCY DEPARTMENT Provider Note   CSN: [id number] Arrival date & time: 04/12/19  2140    History   Chief Complaint Chief Complaint  Patient presents with  . Fall    HPI Ronnie Moore is a 58 y.o. 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  He reports that he was going up the wheelchair ramp when it tipped backward causing him to fall back and strike the back of his head on the ground.  He is unsure if he lost consciousness.  He reports severe pain in his neck and head.  His back is hurting him, however that is his usual chronic back pain and is unchanged.  He denies any worsening weakness or numbness.  He has been well recently.       HPI  Past Medical History:  Diagnosis Date  . Cancer ([place])   . Chronic back pain   . Hypertension   . Stroke [facility])     Patient Active Problem List   Diagnosis Date Noted  . Cellulitis 06/06/2016  . Cellulitis of right upper extremity 06/06/2016  . Hypokalemia 06/06/2016  . Essential hypertension 06/06/2016  . Abnormal LFTs 06/06/2016  . Chronic back pain 06/06/2016    Past Surgical History:  Procedure Laterality Date  . BACK SURGERY    . I&D EXTREMITY Right 06/07/2016   Procedure: IRRIGATION AND DEBRIDEMENT WRIST;  Surgeon: Dayna Barker, MD;  Location: [place];  Service: Plastics;  Laterality: Right;  . KNEE SURGERY          Home Medications    Prior to Admission medications   Medication Sig Start Date End Date Taking? Authorizing Provider  docusate sodium (COLACE) 50 MG capsule Take 1 capsule (50 mg total) by mouth 2 (two) times daily. 06/09/16   Reyne Dumas, MD  LORazepam (ATIVAN) 2 MG tablet Take 1 tablet (2 mg total) by mouth 3 (three) times daily. Patient not taking: Reported on 06/06/2016 08/30/12   Leonard Schwartz, MD  meloxicam (MOBIC) 15 MG tablet Take  1 tablet (15 mg total) by mouth daily. Patient not taking: Reported on 06/06/2016 08/30/12   Leonard Schwartz, MD  nicotine (NICODERM CQ - DOSED IN MG/24 HOURS) 21 mg/24hr patch Place 1 patch (21 mg total) onto the skin daily. 06/09/16   Reyne Dumas, MD  Olmesartan-Amlodipine-HCTZ (TRIBENZOR) 40-10-25 MG TABS Take 1 tablet by mouth daily.    [provider]  Oxycodone HCl 20 MG TABS Take 20 mg by mouth every 4 (four) hours. 05/24/16   [provider]  pantoprazole (PROTONIX) 40 MG tablet Take 1 tablet (40 mg total) by mouth daily. 06/09/16   Reyne Dumas, MD  polyethylene glycol (MIRALAX / GLYCOLAX) packet Take 17 g by mouth 2 (two) times daily. 06/09/16   Reyne Dumas, MD  potassium chloride 20 MEQ/15ML (10%) SOLN Take 30 mLs (40 mEq total) by mouth daily. 06/09/16   Reyne Dumas, MD  sulfamethoxazole-trimethoprim (BACTRIM DS,SEPTRA DS) 800-160 MG tablet Take 1 tablet by mouth every 12 (twelve) hours. 06/10/16   Reyne Dumas, MD  temazepam (RESTORIL) 15 MG capsule Take 1 capsule (15 mg total) by mouth at bedtime as needed for sleep. 06/09/16   Reyne Dumas, MD    Family History Family History  Problem Relation Age of Onset  . Arthritis Mother   . Hypertension Father   . Heart disease Father   .  Diabetes Mellitus II Sister     Social History Social History   Tobacco Use  . Smoking status: Current Every Day Smoker    Packs/day: 0.50  . Smokeless tobacco: Never Used  Substance Use Topics  . Alcohol use: Not Currently    Comment: occ  . Drug use: No     Allergies   Metaxalone   Review of Systems Review of Systems  Constitutional: Negative for chills and fever.  HENT: Negative for congestion.   Respiratory: Negative for chest tightness and shortness of breath.   Cardiovascular: Negative for chest pain.  Gastrointestinal: Negative for abdominal pain, diarrhea, nausea and vomiting.  Genitourinary: Negative for dysuria.  Musculoskeletal: Positive for back pain and neck  pain.  Neurological: Positive for syncope (Questionable) and headaches. Negative for dizziness, facial asymmetry and weakness.  Psychiatric/Behavioral: Negative for confusion.  All other systems reviewed and are negative.    Physical Exam Updated Vital Signs BP (!) 151/85   Pulse 76   Temp 98.1 F (36.7 C) (Oral)   Resp 20   Ht 5\' 8"  (1.727 m)   Wt 136.1 kg   SpO2 99%   BMI 45.61 kg/m   Physical Exam Vitals signs and nursing note reviewed.  Constitutional:      General: He is not in acute distress.    Appearance: He is well-developed.  HENT:     Head: Normocephalic and atraumatic.     Mouth/Throat:     Mouth: Mucous membranes are moist.  Eyes:     Extraocular Movements: Extraocular movements intact.     Conjunctiva/sclera: Conjunctivae normal.     Pupils: Pupils are equal, round, and reactive to light.  Neck:     Comments: Towel roll in place by EMS.  ROM not tested. Cardiovascular:     Rate and Rhythm: Normal rate and regular rhythm.     Pulses: Normal pulses.     Heart sounds: Normal heart sounds. No murmur.  Pulmonary:     Effort: Pulmonary effort is normal. No respiratory distress.     Breath sounds: Normal breath sounds.  Abdominal:     General: There is no distension.     Palpations: Abdomen is soft. There is no mass.     Tenderness: There is no abdominal tenderness.     Hernia: No hernia is present.  Musculoskeletal:     Right lower leg: No edema.     Left lower leg: No edema.  Skin:    General: Skin is warm and dry.  Neurological:     Mental Status: He is alert.     Cranial Nerves: No cranial nerve deficit.     Comments: Weakness in bilateral lower extremities at baseline according to patient.  He is able to wiggle toes on his bilateral feet.  5/5 grip strength in bilateral upper extremities. His speech is intermittently stuttering and he gets slightly tremulous.   Psychiatric:        Mood and Affect: Mood normal.        Behavior: Behavior normal.       ED Treatments / Results  Labs (all labs ordered are listed, but only abnormal results are displayed) Labs Reviewed  COMPREHENSIVE METABOLIC PANEL  CBC WITH DIFFERENTIAL/PLATELET  LIPASE, BLOOD  ETHANOL    EKG None  Radiology No results found.  Procedures Procedures (including critical care time)  Medications Ordered in ED Medications - No data to display   Initial Impression / Assessment and Plan / ED Course  I have reviewed the triage vital signs and the nursing notes.  Pertinent labs & imaging results that were available during my care of the patient were reviewed by me and considered in my medical decision making (see chart for details).       Patient presents today for evaluation after he fell out of his wheelchair hitting the back of his head.  He does not remember falling.  According to EMS concern for alcohol intoxication.  Upon my evaluation patient complains of headache and neck pain.  He had intermittent stuttering type speech which he says is new.  He reports his chronic low back pain is unchanged from baseline.  Labs, EKG, CT head and neck were ordered.  At shift change care was transferred to [facility] who will follow pending studies, re-evaulate and determine disposition.      Final Clinical Impressions(s) / ED Diagnoses   Final diagnoses:  None    ED Discharge Orders    None       Ollen Gross 04/12/19 2336    Mesner, Corene Cornea, MD 04/13/19 [phone]

## 2019-04-12 NOTE — ED Notes (Signed)
PA aware that IV team still at bedside attempting to get site and blood work

## 2019-04-12 NOTE — ED Provider Notes (Signed)
Medical screening examination/treatment/procedure(s) were conducted as a shared visit with non-physician practitioner(s) and myself.  I personally evaluated the patient during the encounter.  Multiple issues as documented below with chronic pain and paraplegia the had a fall today.  Denies any alcohol to me.  Keeps complaining of lower back pain.  Difficult to fully assess secondary to the amount of pain he is having.  However this does sound to be chronic in nature.  There is some question from previous team that he may have stuttering but he had very fluent speech and did not have any episodes of word finding difficulties or stuttering on my examination.  Plan for work-up to include labs, imaging and reevaluation for disposition.  Patient consistently complained of lower back pain so CT scan done to evaluate for same.  This is negative for any acute fractures from a fall.  Patient had to be woken up multiple times when he complains of severe back pain.  Wonder about malingering type behavior versus drug-seeking.  No indication for further pain management or work-up in the emergency room at this point.  Patient will be discharged.   Senaya Dicenso, Corene Cornea, MD 04/13/19 [phone]

## 2019-04-12 NOTE — ED Provider Notes (Signed)
Care assumed from Wyn Quaker, PA-C at shift change. See her note for full H&P.  "Ronnie Moore is a 58 y.o. 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  He reports that he was going up the wheelchair ramp when it tipped backward causing him to fall back and strike the back of his head on the ground.  He is unsure if he lost consciousness.  He reports severe pain in his neck and head.  His back is hurting him, however that is his usual chronic back pain and is unchanged.  He denies any worsening weakness or numbness.  He has been well recently."   Currently awaiting imagine and labs. Will need reassessment and if negative, discharge is anticipated.   Physical Exam  BP 129/75   Pulse 79   Temp 98.1 F (36.7 C) (Oral)   Resp 19   Ht 5\' 8"  (1.727 m)   Wt 136.1 kg   SpO2 92%   BMI 45.61 kg/m   Physical Exam Constitutional:      General: He is not in acute distress.    Appearance: He is well-developed.  Eyes:     Conjunctiva/sclera: Conjunctivae normal.  Cardiovascular:     Rate and Rhythm: Normal rate and regular rhythm.     Pulses: Normal pulses.     Heart sounds: Normal heart sounds.  Pulmonary:     Effort: Pulmonary effort is normal.     Breath sounds: Normal breath sounds.  Abdominal:     General: Bowel sounds are normal.     Palpations: Abdomen is soft.     Tenderness: There is no abdominal tenderness.  Musculoskeletal:     Comments: Midline lumbar ttp (chronic per pt). No TTP to the pelvis or hips.  Skin:    General: Skin is warm and dry.  Neurological:     Mental Status: He is alert and oriented to person, place, and time.     Comments: Sleeping comfortably upon entering room. Awakens easily.  Patient is able to answer questions appropriately.  His speech is fluent and he has no evidence of stuttering or aphasia.  Is able to  move bilat upper extremities and is able to roll himself in bed. Decreased strength to ble 2/2 previous injury (chronic). No new sensory changes.      ED Course/Procedures     Procedures  Results for orders placed or performed during the hospital encounter of 04/12/19  Comprehensive metabolic panel  Result Value Ref Range   Sodium 140 135 - 145 mmol/L   Potassium 3.9 3.5 - 5.1 mmol/L   Chloride 104 98 - 111 mmol/L   CO2 22 22 - 32 mmol/L   Glucose, Bld 77 70 - 99 mg/dL   BUN 12 6 - 20 mg/dL   Creatinine, Ser 0.86 0.61 - 1.24 mg/dL   Calcium 9.4 8.9 - 10.3 mg/dL   Total Protein 7.2 6.5 - 8.1 g/dL   Albumin 3.6 3.5 - 5.0 g/dL   AST 20 15 - 41 U/L   ALT 15 0 - 44 U/L   Alkaline Phosphatase 64 38 - 126 U/L   Total Bilirubin 0.6 0.3 - 1.2 mg/dL   GFR calc non Af Amer >60 >60 mL/min   GFR calc Af Amer >60 >60 mL/min   Anion gap 14 5 - 15  CBC with Differential  Result Value Ref Range  WBC 5.4 4.0 - 10.5 K/uL   RBC 5.04 4.22 - 5.81 MIL/uL   Hemoglobin 15.1 13.0 - 17.0 g/dL   HCT 46.0 39.0 - 52.0 %   MCV 91.3 80.0 - 100.0 fL   MCH 30.0 26.0 - 34.0 pg   MCHC 32.8 30.0 - 36.0 g/dL   RDW 14.7 11.5 - 15.5 %   Platelets 214 150 - 400 K/uL   nRBC 0.0 0.0 - 0.2 %   Neutrophils Relative % 53 %   Neutro Abs 2.9 1.7 - 7.7 K/uL   Lymphocytes Relative 36 %   Lymphs Abs 2.0 0.7 - 4.0 K/uL   Monocytes Relative 8 %   Monocytes Absolute 0.4 0.1 - 1.0 K/uL   Eosinophils Relative 3 %   Eosinophils Absolute 0.2 0.0 - 0.5 K/uL   Basophils Relative 0 %   Basophils Absolute 0.0 0.0 - 0.1 K/uL   Immature Granulocytes 0 %   Abs Immature Granulocytes 0.01 0.00 - 0.07 K/uL  Lipase, blood  Result Value Ref Range   Lipase 37 11 - 51 U/L  Ethanol  Result Value Ref Range   Alcohol, Ethyl (B) 14 (H) <10 mg/dL   Ct Head Wo Contrast  Result Date: 04/13/2019 CLINICAL DATA:  Fall.  Possible loss of consciousness. EXAM: CT HEAD WITHOUT CONTRAST CT CERVICAL SPINE WITHOUT CONTRAST TECHNIQUE:  Multidetector CT imaging of the head and cervical spine was performed following the standard protocol without intravenous contrast. Multiplanar CT image reconstructions of the cervical spine were also generated. COMPARISON:  MRI of the cervical spine 12/09/2017. CT head 05/19/2017. FINDINGS: CT HEAD FINDINGS Brain: No acute intracranial abnormality. Specifically, no hemorrhage, hydrocephalus, mass lesion, acute infarction, or significant intracranial injury. Vascular: No hyperdense vessel or unexpected calcification. Skull: No acute calvarial abnormality. Sinuses/Orbits: No acute finding. Old right medial orbital wall blowout fracture. Other: None CT CERVICAL SPINE FINDINGS Alignment: Normal Skull base and vertebrae: No acute fracture. No primary bone lesion or focal pathologic process. Soft tissues and spinal canal: No prevertebral fluid or swelling. No visible canal hematoma. Disc levels:  Diffuse degenerative disc and facet disease. Upper chest: Negative Other: None IMPRESSION: No acute intracranial abnormality. Diffuse degenerative disc and facet disease. Electronically Signed   By: Rolm Baptise M.D.   On: 04/13/2019 01:56   Ct Cervical Spine Wo Contrast  Result Date: 04/13/2019 CLINICAL DATA:  Fall.  Possible loss of consciousness. EXAM: CT HEAD WITHOUT CONTRAST CT CERVICAL SPINE WITHOUT CONTRAST TECHNIQUE: Multidetector CT imaging of the head and cervical spine was performed following the standard protocol without intravenous contrast. Multiplanar CT image reconstructions of the cervical spine were also generated. COMPARISON:  MRI of the cervical spine 12/09/2017. CT head 05/19/2017. FINDINGS: CT HEAD FINDINGS Brain: No acute intracranial abnormality. Specifically, no hemorrhage, hydrocephalus, mass lesion, acute infarction, or significant intracranial injury. Vascular: No hyperdense vessel or unexpected calcification. Skull: No acute calvarial abnormality. Sinuses/Orbits: No acute finding. Old right medial  orbital wall blowout fracture. Other: None CT CERVICAL SPINE FINDINGS Alignment: Normal Skull base and vertebrae: No acute fracture. No primary bone lesion or focal pathologic process. Soft tissues and spinal canal: No prevertebral fluid or swelling. No visible canal hematoma. Disc levels:  Diffuse degenerative disc and facet disease. Upper chest: Negative Other: None IMPRESSION: No acute intracranial abnormality. Diffuse degenerative disc and facet disease. Electronically Signed   By: Rolm Baptise M.D.   On: 04/13/2019 01:56   Ct Lumbar Spine Wo Contrast  Result Date: 04/13/2019 CLINICAL DATA:  58  y/o  M; status post fall. EXAM: CT LUMBAR SPINE WITHOUT CONTRAST TECHNIQUE: Multidetector CT imaging of the lumbar spine was performed without intravenous contrast administration. Multiplanar CT image reconstructions were also generated. COMPARISON:  12/09/2017 lumbar spine lumbar spine MRI. FINDINGS: Segmentation: 5 lumbar type vertebrae. Alignment: Normal. Vertebrae: No acute fracture or focal pathologic process. Paraspinal and other soft tissues: Abdominal aortic calcific atherosclerosis. Disc levels: Sacroiliac joint fusion and multilevel bridging syndesmophytes can be seen with spondyloarthropathy. Severe multilevel facet arthropathy with extensive productive changes at the right L4-5 level. Facet hypertrophy and endplate marginal osteophytes result in mild bilateral neural foraminal stenosis at the L2 through S1 levels. No high-grade bony spinal canal stenosis. IMPRESSION: 1. No acute fracture or dislocation identified. 2. Stable lumbar spondylosis with advanced lower lumbar facet arthropathy given differences in technique. 3. Aortic Atherosclerosis (ICD10-I70.0). Electronically Signed   By: Kristine Garbe M.D.   On: 04/13/2019 03:51     MDM   Patient presenting after fall from wheelchair.  States he struck his head and was having head and neck pain.  Also was initially complaining of lower back  pain which she stated was chronic.  There was a question of possible stuttering on the initial evaluation.  Plan is to follow-up on laboratory work and imaging.  Labs are reassuring.  CT of the head is negative for skull fracture or intracranial bleed.  CT T cervical spine is negative for acute traumatic injury.  On my re-evaluation of the patient, he is sleeping comfortably in bed but is easily arousable.  He has no stuttering on my exam.  Speech is fluent.  He complains of lower back pain stating it is worse than his chronic pain.  CT lumbar spine ordered.  Additional pain medications ordered.  On reassessment patient is still sleeping comfortably but arouses easily.  I discussed results of imaging studies including a negative CT head, neck and lumbar spine.  He still complains of pain, I discussed that he likely may be having muscle soreness and he will need to continue his home pain regimen. He does not show evidence that would warrant further emergent imaging with MRI.  He does not appear to be in any discomfort.  I feel he is appropriate for discharge with outpatient follow-up.  Discussed return precautions.  He voices understanding and is in agreement with plan.  Patient's family member was updated and also voiced understanding.  Patient discharged in stable condition.      Bishop Dublin 04/13/19 0538    Merrily Pew, MD 04/13/19 [phone]

## 2019-04-12 NOTE — ED Notes (Signed)
IV team at bedside 

## 2019-04-12 NOTE — ED Triage Notes (Signed)
Pt coming from home after going up wheelchair ramp at home and falling backwards in chair. Possible LOC. Pt remembers falling then remembers waking up in ambulance. ETOH on board per pt. C spine maintained by EMS. Pt states he had a stroke about 4 years ago and has used a wheelchair ever since. Vitals WDL other than hypertension of 165/110

## 2019-04-13 ENCOUNTER — Emergency Department (HOSPITAL_COMMUNITY): Payer: Medicare Other

## 2019-04-13 DIAGNOSIS — R5381 Other malaise: Secondary | ICD-10-CM | POA: Diagnosis not present

## 2019-04-13 DIAGNOSIS — Z743 Need for continuous supervision: Secondary | ICD-10-CM | POA: Diagnosis not present

## 2019-04-13 DIAGNOSIS — S3992XA Unspecified injury of lower back, initial encounter: Secondary | ICD-10-CM | POA: Diagnosis not present

## 2019-04-13 DIAGNOSIS — R279 Unspecified lack of coordination: Secondary | ICD-10-CM | POA: Diagnosis not present

## 2019-04-13 DIAGNOSIS — S199XXA Unspecified injury of neck, initial encounter: Secondary | ICD-10-CM | POA: Diagnosis not present

## 2019-04-13 DIAGNOSIS — W19XXXA Unspecified fall, initial encounter: Secondary | ICD-10-CM | POA: Diagnosis not present

## 2019-04-13 DIAGNOSIS — S0990XA Unspecified injury of head, initial encounter: Secondary | ICD-10-CM | POA: Diagnosis not present

## 2019-04-13 DIAGNOSIS — M545 Low back pain: Secondary | ICD-10-CM | POA: Diagnosis not present

## 2019-04-13 LAB — CBC WITH DIFFERENTIAL/PLATELET
Abs Immature Granulocytes: 0.01 10*3/uL (ref 0.00–0.07)
Basophils Absolute: 0 10*3/uL (ref 0.0–0.1)
Basophils Relative: 0 %
Eosinophils Absolute: 0.2 10*3/uL (ref 0.0–0.5)
Eosinophils Relative: 3 %
HCT: 46 % (ref 39.0–52.0)
Hemoglobin: 15.1 g/dL (ref 13.0–17.0)
Immature Granulocytes: 0 %
Lymphocytes Relative: 36 %
Lymphs Abs: 2 10*3/uL (ref 0.7–4.0)
MCH: 30 pg (ref 26.0–34.0)
MCHC: 32.8 g/dL (ref 30.0–36.0)
MCV: 91.3 fL (ref 80.0–100.0)
Monocytes Absolute: 0.4 10*3/uL (ref 0.1–1.0)
Monocytes Relative: 8 %
Neutro Abs: 2.9 10*3/uL (ref 1.7–7.7)
Neutrophils Relative %: 53 %
Platelets: 214 10*3/uL (ref 150–400)
RBC: 5.04 MIL/uL (ref 4.22–5.81)
RDW: 14.7 % (ref 11.5–15.5)
WBC: 5.4 10*3/uL (ref 4.0–10.5)
nRBC: 0 % (ref 0.0–0.2)

## 2019-04-13 LAB — COMPREHENSIVE METABOLIC PANEL
ALT: 15 U/L (ref 0–44)
AST: 20 U/L (ref 15–41)
Albumin: 3.6 g/dL (ref 3.5–5.0)
Alkaline Phosphatase: 64 U/L (ref 38–126)
Anion gap: 14 (ref 5–15)
BUN: 12 mg/dL (ref 6–20)
CO2: 22 mmol/L (ref 22–32)
Calcium: 9.4 mg/dL (ref 8.9–10.3)
Chloride: 104 mmol/L (ref 98–111)
Creatinine, Ser: 0.86 mg/dL (ref 0.61–1.24)
GFR calc Af Amer: >60 mL/min (ref >60)
GFR calc non Af Amer: >60 mL/min (ref >60)
Glucose, Bld: 77 mg/dL (ref 70–99)
Potassium: 3.9 mmol/L (ref 3.5–5.1)
Sodium: 140 mmol/L (ref 135–145)
Total Bilirubin: 0.6 mg/dL (ref 0.3–1.2)
Total Protein: 7.2 g/dL (ref 6.5–8.1)

## 2019-04-13 LAB — LIPASE, BLOOD: Lipase: 37 U/L (ref 11–51)

## 2019-04-13 LAB — ETHANOL: Alcohol, Ethyl (B): 14 mg/dL — ABNORMAL HIGH (ref <10)

## 2019-04-13 MED ORDER — HYDROCODONE-ACETAMINOPHEN 5-325 MG PO TABS
1.0000 | ORAL_TABLET | Freq: Once | ORAL | Status: AC
  Administered 2019-04-13: 1 via ORAL
  Filled 2019-04-13: qty 1

## 2019-04-13 MED ORDER — ACETAMINOPHEN 325 MG PO TABS
650.0000 mg | ORAL_TABLET | Freq: Once | ORAL | Status: AC
  Administered 2019-04-13: 650 mg via ORAL
  Filled 2019-04-13: qty 2

## 2019-04-13 MED ORDER — FENTANYL CITRATE (PF) 100 MCG/2ML IJ SOLN
25.0000 ug | Freq: Once | INTRAMUSCULAR | Status: DC
  Filled 2019-04-13: qty 2

## 2019-04-13 MED ORDER — OXYCODONE HCL 5 MG PO TABS
15.0000 mg | ORAL_TABLET | Freq: Once | ORAL | Status: AC
  Administered 2019-04-13: 15 mg via ORAL
  Filled 2019-04-13: qty 3

## 2019-04-13 NOTE — ED Notes (Signed)
Gave update to son Randall Hiss. PA to call with results of CT after they are resulted

## 2019-04-13 NOTE — Discharge Instructions (Addendum)
Please continue your regularly scheduled pain medications.  Call your regular doctor tomorrow to schedule an appointment for follow-up.  Return the emergency department for new or worsening symptoms in the meantime.

## 2019-04-13 NOTE — ED Notes (Signed)
Patient verbalizes understanding of discharge instructions. Opportunity for questioning and answers were provided. Armband removed by staff, pt discharged from ED by [facility]   

## 2019-05-04 DIAGNOSIS — M542 Cervicalgia: Secondary | ICD-10-CM | POA: Diagnosis not present

## 2019-05-04 DIAGNOSIS — G9511 Acute infarction of spinal cord (embolic) (nonembolic): Secondary | ICD-10-CM | POA: Diagnosis not present

## 2019-05-04 DIAGNOSIS — G894 Chronic pain syndrome: Secondary | ICD-10-CM | POA: Diagnosis not present

## 2019-05-04 DIAGNOSIS — G8929 Other chronic pain: Secondary | ICD-10-CM | POA: Diagnosis not present

## 2019-05-07 DIAGNOSIS — I639 Cerebral infarction, unspecified: Secondary | ICD-10-CM | POA: Diagnosis not present

## 2019-05-07 DIAGNOSIS — S14109S Unspecified injury at unspecified level of cervical spinal cord, sequela: Secondary | ICD-10-CM | POA: Diagnosis not present

## 2019-05-07 DIAGNOSIS — I69351 Hemiplegia and hemiparesis following cerebral infarction affecting right dominant side: Secondary | ICD-10-CM | POA: Diagnosis not present

## 2019-05-07 DIAGNOSIS — L98491 Non-pressure chronic ulcer of skin of other sites limited to breakdown of skin: Secondary | ICD-10-CM | POA: Diagnosis not present

## 2019-05-07 DIAGNOSIS — R609 Edema, unspecified: Secondary | ICD-10-CM | POA: Diagnosis not present

## 2019-05-10 DIAGNOSIS — N35914 Unspecified anterior urethral stricture, male: Secondary | ICD-10-CM | POA: Diagnosis not present

## 2019-05-11 DIAGNOSIS — M7918 Myalgia, other site: Secondary | ICD-10-CM | POA: Diagnosis not present

## 2019-05-11 DIAGNOSIS — Z79899 Other long term (current) drug therapy: Secondary | ICD-10-CM | POA: Diagnosis not present

## 2019-05-11 DIAGNOSIS — M545 Low back pain: Secondary | ICD-10-CM | POA: Diagnosis not present

## 2019-05-25 DIAGNOSIS — Z789 Other specified health status: Secondary | ICD-10-CM | POA: Diagnosis not present

## 2019-05-25 DIAGNOSIS — R7303 Prediabetes: Secondary | ICD-10-CM | POA: Diagnosis not present

## 2019-06-25 DIAGNOSIS — E7800 Pure hypercholesterolemia, unspecified: Secondary | ICD-10-CM | POA: Diagnosis not present

## 2019-06-25 DIAGNOSIS — Z789 Other specified health status: Secondary | ICD-10-CM | POA: Diagnosis not present

## 2019-06-25 DIAGNOSIS — I10 Essential (primary) hypertension: Secondary | ICD-10-CM | POA: Diagnosis not present

## 2019-07-19 DIAGNOSIS — E785 Hyperlipidemia, unspecified: Secondary | ICD-10-CM | POA: Diagnosis not present

## 2019-07-19 DIAGNOSIS — G4733 Obstructive sleep apnea (adult) (pediatric): Secondary | ICD-10-CM | POA: Diagnosis not present

## 2019-07-19 DIAGNOSIS — Z8673 Personal history of transient ischemic attack (TIA), and cerebral infarction without residual deficits: Secondary | ICD-10-CM | POA: Diagnosis not present

## 2019-07-19 DIAGNOSIS — R7303 Prediabetes: Secondary | ICD-10-CM | POA: Diagnosis not present

## 2019-07-19 DIAGNOSIS — I10 Essential (primary) hypertension: Secondary | ICD-10-CM | POA: Diagnosis not present

## 2019-07-19 DIAGNOSIS — Z0001 Encounter for general adult medical examination with abnormal findings: Secondary | ICD-10-CM | POA: Diagnosis not present

## 2019-07-30 DIAGNOSIS — I10 Essential (primary) hypertension: Secondary | ICD-10-CM | POA: Diagnosis not present

## 2019-07-30 DIAGNOSIS — E7800 Pure hypercholesterolemia, unspecified: Secondary | ICD-10-CM | POA: Diagnosis not present

## 2019-07-30 DIAGNOSIS — Z789 Other specified health status: Secondary | ICD-10-CM | POA: Diagnosis not present

## 2019-08-06 DIAGNOSIS — G894 Chronic pain syndrome: Secondary | ICD-10-CM | POA: Diagnosis not present

## 2019-08-06 DIAGNOSIS — E1165 Type 2 diabetes mellitus with hyperglycemia: Secondary | ICD-10-CM | POA: Diagnosis not present

## 2019-08-06 DIAGNOSIS — Z1159 Encounter for screening for other viral diseases: Secondary | ICD-10-CM | POA: Diagnosis not present

## 2019-08-06 DIAGNOSIS — Z79899 Other long term (current) drug therapy: Secondary | ICD-10-CM | POA: Diagnosis not present

## 2019-08-06 DIAGNOSIS — E7800 Pure hypercholesterolemia, unspecified: Secondary | ICD-10-CM | POA: Diagnosis not present

## 2019-08-14 DIAGNOSIS — I10 Essential (primary) hypertension: Secondary | ICD-10-CM | POA: Diagnosis not present

## 2019-08-14 DIAGNOSIS — E1165 Type 2 diabetes mellitus with hyperglycemia: Secondary | ICD-10-CM | POA: Diagnosis not present

## 2019-08-14 DIAGNOSIS — Z789 Other specified health status: Secondary | ICD-10-CM | POA: Diagnosis not present

## 2019-09-12 DIAGNOSIS — E7800 Pure hypercholesterolemia, unspecified: Secondary | ICD-10-CM | POA: Diagnosis not present

## 2019-09-12 DIAGNOSIS — Z789 Other specified health status: Secondary | ICD-10-CM | POA: Diagnosis not present

## 2019-09-12 DIAGNOSIS — E1165 Type 2 diabetes mellitus with hyperglycemia: Secondary | ICD-10-CM | POA: Diagnosis not present

## 2019-11-16 ENCOUNTER — Emergency Department (HOSPITAL_BASED_OUTPATIENT_CLINIC_OR_DEPARTMENT_OTHER): Payer: Medicare Other

## 2019-11-16 ENCOUNTER — Other Ambulatory Visit: Payer: Self-pay

## 2019-11-16 ENCOUNTER — Inpatient Hospital Stay (HOSPITAL_BASED_OUTPATIENT_CLINIC_OR_DEPARTMENT_OTHER)
Admission: EM | Admit: 2019-11-16 | Discharge: 2019-11-28 | DRG: 442 | Disposition: A | Discharge status: Skilled Nursing Facility | Payer: Medicare Other | Attending: Family Medicine | Admitting: Family Medicine

## 2019-11-16 ENCOUNTER — Encounter (HOSPITAL_BASED_OUTPATIENT_CLINIC_OR_DEPARTMENT_OTHER): Payer: Self-pay

## 2019-11-16 DIAGNOSIS — Z20828 Contact with and (suspected) exposure to other viral communicable diseases: Secondary | ICD-10-CM | POA: Diagnosis present

## 2019-11-16 DIAGNOSIS — R7401 Elevation of levels of liver transaminase levels: Secondary | ICD-10-CM | POA: Diagnosis not present

## 2019-11-16 DIAGNOSIS — Z8673 Personal history of transient ischemic attack (TIA), and cerebral infarction without residual deficits: Secondary | ICD-10-CM

## 2019-11-16 DIAGNOSIS — G8220 Paraplegia, unspecified: Secondary | ICD-10-CM | POA: Diagnosis present

## 2019-11-16 DIAGNOSIS — G4733 Obstructive sleep apnea (adult) (pediatric): Secondary | ICD-10-CM | POA: Diagnosis not present

## 2019-11-16 DIAGNOSIS — W19XXXA Unspecified fall, initial encounter: Secondary | ICD-10-CM

## 2019-11-16 DIAGNOSIS — F17210 Nicotine dependence, cigarettes, uncomplicated: Secondary | ICD-10-CM | POA: Diagnosis present

## 2019-11-16 DIAGNOSIS — Z888 Allergy status to other drugs, medicaments and biological substances status: Secondary | ICD-10-CM

## 2019-11-16 DIAGNOSIS — K828 Other specified diseases of gallbladder: Secondary | ICD-10-CM | POA: Diagnosis not present

## 2019-11-16 DIAGNOSIS — R1011 Right upper quadrant pain: Secondary | ICD-10-CM

## 2019-11-16 DIAGNOSIS — I10 Essential (primary) hypertension: Secondary | ICD-10-CM | POA: Diagnosis not present

## 2019-11-16 DIAGNOSIS — R29898 Other symptoms and signs involving the musculoskeletal system: Secondary | ICD-10-CM | POA: Diagnosis not present

## 2019-11-16 DIAGNOSIS — M549 Dorsalgia, unspecified: Secondary | ICD-10-CM | POA: Diagnosis present

## 2019-11-16 DIAGNOSIS — Z8249 Family history of ischemic heart disease and other diseases of the circulatory system: Secondary | ICD-10-CM

## 2019-11-16 DIAGNOSIS — B182 Chronic viral hepatitis C: Secondary | ICD-10-CM | POA: Diagnosis present

## 2019-11-16 DIAGNOSIS — R52 Pain, unspecified: Secondary | ICD-10-CM | POA: Diagnosis not present

## 2019-11-16 DIAGNOSIS — Z833 Family history of diabetes mellitus: Secondary | ICD-10-CM

## 2019-11-16 DIAGNOSIS — D709 Neutropenia, unspecified: Secondary | ICD-10-CM | POA: Diagnosis not present

## 2019-11-16 DIAGNOSIS — Z791 Long term (current) use of non-steroidal anti-inflammatories (NSAID): Secondary | ICD-10-CM | POA: Diagnosis not present

## 2019-11-16 DIAGNOSIS — S50811A Abrasion of right forearm, initial encounter: Secondary | ICD-10-CM | POA: Diagnosis not present

## 2019-11-16 DIAGNOSIS — M6281 Muscle weakness (generalized): Secondary | ICD-10-CM | POA: Diagnosis not present

## 2019-11-16 DIAGNOSIS — Z23 Encounter for immunization: Secondary | ICD-10-CM | POA: Diagnosis not present

## 2019-11-16 DIAGNOSIS — E860 Dehydration: Secondary | ICD-10-CM | POA: Diagnosis not present

## 2019-11-16 DIAGNOSIS — G8929 Other chronic pain: Secondary | ICD-10-CM | POA: Diagnosis not present

## 2019-11-16 DIAGNOSIS — R7989 Other specified abnormal findings of blood chemistry: Secondary | ICD-10-CM

## 2019-11-16 DIAGNOSIS — Z743 Need for continuous supervision: Secondary | ICD-10-CM | POA: Diagnosis not present

## 2019-11-16 DIAGNOSIS — M5489 Other dorsalgia: Secondary | ICD-10-CM | POA: Diagnosis not present

## 2019-11-16 DIAGNOSIS — Z993 Dependence on wheelchair: Secondary | ICD-10-CM

## 2019-11-16 DIAGNOSIS — R7303 Prediabetes: Secondary | ICD-10-CM | POA: Diagnosis not present

## 2019-11-16 DIAGNOSIS — L02414 Cutaneous abscess of left upper limb: Secondary | ICD-10-CM | POA: Diagnosis not present

## 2019-11-16 DIAGNOSIS — Z8261 Family history of arthritis: Secondary | ICD-10-CM | POA: Diagnosis not present

## 2019-11-16 DIAGNOSIS — R945 Abnormal results of liver function studies: Secondary | ICD-10-CM | POA: Diagnosis not present

## 2019-11-16 DIAGNOSIS — K810 Acute cholecystitis: Secondary | ICD-10-CM | POA: Diagnosis not present

## 2019-11-16 DIAGNOSIS — B159 Hepatitis A without hepatic coma: Principal | ICD-10-CM | POA: Diagnosis present

## 2019-11-16 DIAGNOSIS — F1120 Opioid dependence, uncomplicated: Secondary | ICD-10-CM | POA: Diagnosis present

## 2019-11-16 DIAGNOSIS — E785 Hyperlipidemia, unspecified: Secondary | ICD-10-CM | POA: Diagnosis not present

## 2019-11-16 DIAGNOSIS — M545 Low back pain: Secondary | ICD-10-CM | POA: Diagnosis not present

## 2019-11-16 DIAGNOSIS — S3992XA Unspecified injury of lower back, initial encounter: Secondary | ICD-10-CM | POA: Diagnosis not present

## 2019-11-16 DIAGNOSIS — S3993XA Unspecified injury of pelvis, initial encounter: Secondary | ICD-10-CM | POA: Diagnosis not present

## 2019-11-16 DIAGNOSIS — K819 Cholecystitis, unspecified: Secondary | ICD-10-CM | POA: Diagnosis not present

## 2019-11-16 DIAGNOSIS — R079 Chest pain, unspecified: Secondary | ICD-10-CM | POA: Diagnosis not present

## 2019-11-16 DIAGNOSIS — R1084 Generalized abdominal pain: Secondary | ICD-10-CM | POA: Diagnosis not present

## 2019-11-16 DIAGNOSIS — Z03818 Encounter for observation for suspected exposure to other biological agents ruled out: Secondary | ICD-10-CM | POA: Diagnosis not present

## 2019-11-16 DIAGNOSIS — M7989 Other specified soft tissue disorders: Secondary | ICD-10-CM | POA: Diagnosis not present

## 2019-11-16 DIAGNOSIS — Z7401 Bed confinement status: Secondary | ICD-10-CM | POA: Diagnosis not present

## 2019-11-16 DIAGNOSIS — T07XXXA Unspecified multiple injuries, initial encounter: Secondary | ICD-10-CM

## 2019-11-16 DIAGNOSIS — S3991XA Unspecified injury of abdomen, initial encounter: Secondary | ICD-10-CM | POA: Diagnosis not present

## 2019-11-16 DIAGNOSIS — M2550 Pain in unspecified joint: Secondary | ICD-10-CM | POA: Diagnosis not present

## 2019-11-16 LAB — URINALYSIS, ROUTINE W REFLEX MICROSCOPIC
Glucose, UA: NEGATIVE mg/dL
Hgb urine dipstick: NEGATIVE
Ketones, ur: 15 mg/dL — AB
Leukocytes,Ua: NEGATIVE
Nitrite: NEGATIVE
Protein, ur: NEGATIVE mg/dL
Specific Gravity, Urine: 1.02 (ref 1.005–1.030)
pH: 6 (ref 5.0–8.0)

## 2019-11-16 LAB — COMPREHENSIVE METABOLIC PANEL
ALT: 1756 U/L — ABNORMAL HIGH (ref 0–44)
AST: 2300 U/L — ABNORMAL HIGH (ref 15–41)
Albumin: 3.5 g/dL (ref 3.5–5.0)
Alkaline Phosphatase: 132 U/L — ABNORMAL HIGH (ref 38–126)
Anion gap: 14 (ref 5–15)
BUN: 10 mg/dL (ref 6–20)
CO2: 20 mmol/L — ABNORMAL LOW (ref 22–32)
Calcium: 8.8 mg/dL — ABNORMAL LOW (ref 8.9–10.3)
Chloride: 101 mmol/L (ref 98–111)
Creatinine, Ser: 0.69 mg/dL (ref 0.61–1.24)
GFR calc Af Amer: >60 mL/min (ref >60)
GFR calc non Af Amer: >60 mL/min (ref >60)
Glucose, Bld: 89 mg/dL (ref 70–99)
Potassium: 3.8 mmol/L (ref 3.5–5.1)
Sodium: 135 mmol/L (ref 135–145)
Total Bilirubin: 3.9 mg/dL — ABNORMAL HIGH (ref 0.3–1.2)
Total Protein: 7.3 g/dL (ref 6.5–8.1)

## 2019-11-16 LAB — SARS CORONAVIRUS 2 (TAT 6-24 HRS): SARS Coronavirus 2: NEGATIVE

## 2019-11-16 LAB — CBC WITH DIFFERENTIAL/PLATELET
Abs Immature Granulocytes: 0.01 10*3/uL (ref 0.00–0.07)
Basophils Absolute: 0 10*3/uL (ref 0.0–0.1)
Basophils Relative: 1 %
Eosinophils Absolute: 0 10*3/uL (ref 0.0–0.5)
Eosinophils Relative: 1 %
HCT: 48.1 % (ref 39.0–52.0)
Hemoglobin: 15.6 g/dL (ref 13.0–17.0)
Immature Granulocytes: 0 %
Lymphocytes Relative: 48 %
Lymphs Abs: 1.5 10*3/uL (ref 0.7–4.0)
MCH: 29.7 pg (ref 26.0–34.0)
MCHC: 32.4 g/dL (ref 30.0–36.0)
MCV: 91.4 fL (ref 80.0–100.0)
Monocytes Absolute: 0.3 10*3/uL (ref 0.1–1.0)
Monocytes Relative: 10 %
Neutro Abs: 1.3 10*3/uL — ABNORMAL LOW (ref 1.7–7.7)
Neutrophils Relative %: 40 %
Platelets: 131 10*3/uL — ABNORMAL LOW (ref 150–400)
RBC: 5.26 MIL/uL (ref 4.22–5.81)
RDW: 13.8 % (ref 11.5–15.5)
WBC: 3.2 10*3/uL — ABNORMAL LOW (ref 4.0–10.5)
nRBC: 0 % (ref 0.0–0.2)

## 2019-11-16 LAB — ETHANOL: Alcohol, Ethyl (B): <10 mg/dL (ref <10)

## 2019-11-16 LAB — LIPASE, BLOOD: Lipase: 198 U/L — ABNORMAL HIGH (ref 11–51)

## 2019-11-16 LAB — PROTIME-INR
INR: 1.2 (ref 0.8–1.2)
Prothrombin Time: 14.7 seconds (ref 11.4–15.2)

## 2019-11-16 LAB — SARS CORONAVIRUS 2 AG (30 MIN TAT): SARS Coronavirus 2 Ag: NEGATIVE

## 2019-11-16 LAB — ACETAMINOPHEN LEVEL: Acetaminophen (Tylenol), Serum: <10 ug/mL — ABNORMAL LOW (ref 10–30)

## 2019-11-16 MED ORDER — SODIUM CHLORIDE 0.9 % IV SOLN
INTRAVENOUS | Status: AC
  Administered 2019-11-17: via INTRAVENOUS

## 2019-11-16 MED ORDER — PIPERACILLIN-TAZOBACTAM 3.375 G IVPB 30 MIN
3.3750 g | Freq: Once | INTRAVENOUS | Status: AC
  Administered 2019-11-16: 3.375 g via INTRAVENOUS
  Filled 2019-11-16 (×2): qty 50

## 2019-11-16 MED ORDER — HYDROMORPHONE HCL 1 MG/ML IJ SOLN
1.0000 mg | Freq: Once | INTRAMUSCULAR | Status: AC
  Administered 2019-11-16: 1 mg via INTRAVENOUS
  Filled 2019-11-16: qty 1

## 2019-11-16 MED ORDER — LIDOCAINE-EPINEPHRINE (PF) 2 %-1:200000 IJ SOLN
10.0000 mL | Freq: Once | INTRAMUSCULAR | Status: AC
  Administered 2019-11-16: 10 mL
  Filled 2019-11-16: qty 10

## 2019-11-16 MED ORDER — OXYCODONE HCL 5 MG PO TABS
5.0000 mg | ORAL_TABLET | ORAL | Status: DC | PRN
  Administered 2019-11-17 (×2): 5 mg via ORAL
  Filled 2019-11-16 (×2): qty 1

## 2019-11-16 MED ORDER — TETANUS-DIPHTH-ACELL PERTUSSIS 5-2.5-18.5 LF-MCG/0.5 IM SUSP
0.5000 mL | Freq: Once | INTRAMUSCULAR | Status: AC
  Administered 2019-11-16: 12:00:00 0.5 mL via INTRAMUSCULAR
  Filled 2019-11-16: qty 0.5

## 2019-11-16 MED ORDER — VANCOMYCIN HCL 10 G IV SOLR
1500.0000 mg | Freq: Two times a day (BID) | INTRAVENOUS | Status: DC
  Administered 2019-11-17 – 2019-11-19 (×5): 1500 mg via INTRAVENOUS
  Filled 2019-11-16 (×6): qty 1500

## 2019-11-16 MED ORDER — MORPHINE SULFATE (PF) 4 MG/ML IV SOLN
4.0000 mg | Freq: Once | INTRAVENOUS | Status: AC
  Administered 2019-11-16: 4 mg via INTRAVENOUS
  Filled 2019-11-16: qty 1

## 2019-11-16 MED ORDER — PIPERACILLIN-TAZOBACTAM 3.375 G IVPB
3.3750 g | Freq: Three times a day (TID) | INTRAVENOUS | Status: DC
  Administered 2019-11-17 – 2019-11-19 (×7): 3.375 g via INTRAVENOUS
  Filled 2019-11-16 (×9): qty 50

## 2019-11-16 MED ORDER — VANCOMYCIN HCL IN DEXTROSE 1-5 GM/200ML-% IV SOLN
1000.0000 mg | INTRAVENOUS | Status: AC
  Administered 2019-11-16 (×2): 1000 mg via INTRAVENOUS
  Filled 2019-11-16 (×2): qty 200

## 2019-11-16 MED ORDER — ONDANSETRON HCL 4 MG PO TABS: 4.0000 mg | ORAL_TABLET | Freq: Four times a day (QID) | ORAL | Status: DC | PRN

## 2019-11-16 MED ORDER — MORPHINE SULFATE (PF) 2 MG/ML IV SOLN
1.0000 mg | INTRAVENOUS | Status: DC | PRN
  Administered 2019-11-17 – 2019-11-18 (×9): 1 mg via INTRAVENOUS
  Filled 2019-11-16 (×9): qty 1

## 2019-11-16 MED ORDER — ONDANSETRON HCL 4 MG/2ML IJ SOLN
4.0000 mg | Freq: Four times a day (QID) | INTRAMUSCULAR | Status: DC | PRN
  Administered 2019-11-17: 4 mg via INTRAVENOUS
  Filled 2019-11-16: qty 2

## 2019-11-16 MED ORDER — ONDANSETRON HCL 4 MG/2ML IJ SOLN
4.0000 mg | Freq: Once | INTRAMUSCULAR | Status: AC
  Administered 2019-11-16: 4 mg via INTRAVENOUS
  Filled 2019-11-16: qty 2

## 2019-11-16 MED ORDER — FENTANYL CITRATE (PF) 100 MCG/2ML IJ SOLN
100.0000 ug | Freq: Once | INTRAMUSCULAR | Status: AC
  Administered 2019-11-16: 100 ug via INTRAVENOUS
  Filled 2019-11-16: qty 2

## 2019-11-16 MED ORDER — IOHEXOL 300 MG/ML  SOLN
100.0000 mL | Freq: Once | INTRAMUSCULAR | Status: AC | PRN
  Administered 2019-11-16: 100 mL via INTRAVENOUS

## 2019-11-16 MED ORDER — SENNOSIDES-DOCUSATE SODIUM 8.6-50 MG PO TABS: 1.0000 | ORAL_TABLET | Freq: Every evening | ORAL | Status: DC | PRN

## 2019-11-16 NOTE — H&P (Signed)
History and Physical    Ronnie Moore YVO:[id number] DOB: Dec 23, 1960 DOA: 11/16/2019  PCP: Patient, No Pcp Per  Patient coming from: [place] have personally briefly reviewed patient's old medical records in [place]  Chief Complaint: Abscess of left upper extremity  HPI: Ronnie Moore is a 58 y.o. 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  Patient had a reported fall about 1 week ago injuring his left arm.  He had a reported foreign object stuck in one of the wounds which was removed by EMS.  On the last couple of days he has had swelling around the wound which had the foreign body in place.  He has since developed generalized body aches including worsening of his chronic back pain and new right-sided abdominal pain.  He had an episode of nausea with emesis yesterday with recent poor appetite, which he says is improving.  He denies any subjective fevers, chills, diaphoresis, chest pain, dyspnea, cough.  He reports usually taking 4 pills of extra strength Tylenol daily for his chronic pain.  ED Course:  Initial vitals showed BP 139/84, pulse 70, RR 18, temp 98.3 Fahrenheit, SPO2 97% on room air.  Labs are notable for AST 2300, ALT 1756, alk phos 132, total bilirubin 3.9, lipase 198, sodium 135, potassium 3.8, bicarb 20, BUN 10, creatinine 0.69.  WBC 3.2, hemoglobin 15.6, platelets 131,000.  Serum ethanol and acetaminophen levels are undetectable.  Acute hepatitis panel showed reactive hepatitis A IgM, reactive HCV antibody, and nonreactive hepatitis B surface antigen and hep B core IgM.  SARS-CoV-2 antigen and PCR tests were negative.  Left forearm x-ray was negative for fracture or dislocation, soft tissue swelling of the radial aspect of the distal forearm was noted.  2 view chest x-ray  was negative for focal consolidation, edema, or effusion.  Lumbar spine x-ray was negative for fracture or acute finding.  CT abdomen/pelvis with contrast showed changes suspicious for acute cholecystitis without evidence of gallstone.  Prominent mildly enlarged periceliac gastrohepatic ligament lymph nodes are noted.  RUQ ultrasound showed changes consistent with a calculus cholecystitis, CBD diameter 2 mm.  I&D of the left upper extremity abscess was performed by EDP with documented moderate purulent drainage.  Blood cultures were obtained and pending.  Gastroenterology and general surgery were consulted.  The hospitalist service was consulted to admit for further evaluation and management.  Patient was started on recommended IV antibiotics with vancomycin and Zosyn.  Review of Systems: All systems reviewed and are negative except as documented in history of present illness above.   Past Medical History:  Diagnosis Date  . Cancer ([place])   . Chronic back pain   . Hypertension   . Stroke [facility])     Past Surgical History:  Procedure Laterality Date  . BACK SURGERY    . I&D EXTREMITY Right 06/07/2016   Procedure: IRRIGATION AND DEBRIDEMENT WRIST;  Surgeon: Dayna Barker, MD;  Location: [facility];  Service: Plastics;  Laterality: Right;  . KNEE SURGERY      Social History:  reports that he has been smoking. He has been smoking about 0.50 packs per day. He has never used smokeless tobacco. He reports previous alcohol use. He reports that he does not use drugs.  Allergies  Allergen Reactions  . Metaxalone Swelling    Reaction  to Skelaxin - lips swelled    Family History  Problem Relation Age of Onset  . Arthritis Mother   . Hypertension Father   . Heart disease Father   . Diabetes Mellitus II Sister      Prior to Admission medications   Medication Sig Start Date End Date Taking? Authorizing Provider  docusate sodium (COLACE) 50 MG capsule Take 1 capsule (50 mg total) by mouth  2 (two) times daily. 06/09/16   Reyne Dumas, MD  hydrochlorothiazide (HYDRODIURIL) 25 MG tablet Take 25 mg by mouth daily. 10/29/19   [provider]  LORazepam (ATIVAN) 2 MG tablet Take 1 tablet (2 mg total) by mouth 3 (three) times daily. Patient not taking: Reported on 06/06/2016 08/30/12   Leonard Schwartz, MD  meloxicam (MOBIC) 15 MG tablet Take 1 tablet (15 mg total) by mouth daily. Patient not taking: Reported on 06/06/2016 08/30/12   Leonard Schwartz, MD  nicotine (NICODERM CQ - DOSED IN MG/24 HOURS) 21 mg/24hr patch Place 1 patch (21 mg total) onto the skin daily. 06/09/16   Reyne Dumas, MD  Olmesartan-Amlodipine-HCTZ (TRIBENZOR) 40-10-25 MG TABS Take 1 tablet by mouth daily.    [provider]  Oxycodone HCl 20 MG TABS Take 20 mg by mouth every 4 (four) hours. 05/24/16   [provider]  pantoprazole (PROTONIX) 40 MG tablet Take 1 tablet (40 mg total) by mouth daily. 06/09/16   Reyne Dumas, MD  polyethylene glycol (MIRALAX / GLYCOLAX) packet Take 17 g by mouth 2 (two) times daily. 06/09/16   Reyne Dumas, MD  potassium chloride 20 MEQ/15ML (10%) SOLN Take 30 mLs (40 mEq total) by mouth daily. 06/09/16   Reyne Dumas, MD  sulfamethoxazole-trimethoprim (BACTRIM DS,SEPTRA DS) 800-160 MG tablet Take 1 tablet by mouth every 12 (twelve) hours. 06/10/16   Reyne Dumas, MD  temazepam (RESTORIL) 15 MG capsule Take 1 capsule (15 mg total) by mouth at bedtime as needed for sleep. 06/09/16   Reyne Dumas, MD    Physical Exam: Vitals:   11/16/19 2100 11/16/19 2130 11/16/19 2145 11/16/19 2303  BP: (!) 141/102 (!) 147/76  131/89  Pulse: 73 74  69  Resp:    17  Temp:   98.5 F (36.9 C) 97.7 F (36.5 C)  TempSrc:   Oral Oral  SpO2: 99% 93%  95%  Weight:      Height:        Constitutional: Resting supine in bed, NAD, calm, comfortable Eyes: PERRL, lids and conjunctivae normal ENMT: Mucous membranes are moist. Posterior pharynx clear of any exudate or lesions.Normal dentition.    Neck: normal, supple, no masses. Respiratory: clear to auscultation bilaterally, no wheezing, no crackles. Normal respiratory effort. No accessory muscle use.  Cardiovascular: Regular rate and rhythm, no murmurs / rubs / gallops. No extremity edema. 2+ pedal pulses. Abdomen: Right upper and lower quadrant tenderness to palpation, no masses palpated. Bowel sounds positive.  Musculoskeletal: no clubbing / cyanosis. No joint deformity upper and lower extremities. Good ROM upper extremities, diminished lower extremities. no contractures. Normal muscle tone.  Skin: Wound dressing in place distal left upper extremity s/p I&D, several healing abrasions of left upper extremity Neurologic: CN 2-12 grossly intact. Sensation intact, Strength 5/5 in bilateral upper extremities, 1/5 bilateral lower extremities. Psychiatric: Normal judgment and insight. Alert and oriented x 3. Normal mood.     Labs on Admission: I have personally reviewed following labs and imaging studies  CBC: Recent Labs  Lab 11/16/19 1300  WBC 3.2*  NEUTROABS 1.3*  HGB 15.6  HCT 48.1  MCV 91.4  PLT 564*   Basic Metabolic Panel: Recent Labs  Lab 11/16/19 1300  NA 135  K 3.8  CL 101  CO2 20*  GLUCOSE 89  BUN 10  CREATININE 0.69  CALCIUM 8.8*   GFR: Estimated Creatinine Clearance: 126 mL/min (by C-G formula based on SCr of 0.69 mg/dL). Liver Function Tests: Recent Labs  Lab 11/16/19 1300  AST 2,300*  ALT 1,756*  ALKPHOS 132*  BILITOT 3.9*  PROT 7.3  ALBUMIN 3.5   Recent Labs  Lab 11/16/19 1432  LIPASE 198*   No results for input(s): AMMONIA in the last 168 hours. Coagulation Profile: Recent Labs  Lab 11/16/19 1551  INR 1.2   Cardiac Enzymes: No results for input(s): CKTOTAL, CKMB, CKMBINDEX, TROPONINI in the last 168 hours. BNP (last 3 results) No results for input(s): PROBNP in the last 8760 hours. HbA1C: No results for input(s): HGBA1C in the last 72 hours. CBG: No results for input(s):  GLUCAP in the last 168 hours. Lipid Profile: No results for input(s): CHOL, HDL, LDLCALC, TRIG, CHOLHDL, LDLDIRECT in the last 72 hours. Thyroid Function Tests: No results for input(s): TSH, T4TOTAL, FREET4, T3FREE, THYROIDAB in the last 72 hours. Anemia Panel: No results for input(s): VITAMINB12, FOLATE, FERRITIN, TIBC, IRON, RETICCTPCT in the last 72 hours. Urine analysis:    Component Value Date/Time   COLORURINE AMBER (A) 11/16/2019 1520   APPEARANCEUR CLEAR 11/16/2019 1520   LABSPEC 1.020 11/16/2019 1520   PHURINE 6.0 11/16/2019 1520   GLUCOSEU NEGATIVE 11/16/2019 1520   HGBUR NEGATIVE 11/16/2019 1520   BILIRUBINUR MODERATE (A) 11/16/2019 1520   KETONESUR 15 (A) 11/16/2019 1520   PROTEINUR NEGATIVE 11/16/2019 1520   NITRITE NEGATIVE 11/16/2019 1520   LEUKOCYTESUR NEGATIVE 11/16/2019 1520    Radiological Exams on Admission: DG Chest 2 View  Result Date: 11/16/2019 CLINICAL DATA:  [place] 1 week ago.  Pain.  Smoker. EXAM: CHEST - 2 VIEW COMPARISON:  05/19/2017. FINDINGS: Cardiac silhouette is normal in size. No mediastinal or hilar masses or evidence of adenopathy. Clear lungs.  No pleural effusion or pneumothorax. Skeletal structures are intact. IMPRESSION: No active cardiopulmonary disease. Electronically Signed   By: Lajean Manes M.D.   On: 11/16/2019 12:50   DG Lumbar Spine Complete  Result Date: 11/16/2019 CLINICAL DATA:  [place] 1 week ago.  Pain. EXAM: LUMBAR SPINE - COMPLETE 4+ VIEW COMPARISON:  06/27/2017. FINDINGS: No fracture, bone lesion or spondylolisthesis. Mild loss of disc height at L4-L5 and L5-S1. There are facet degenerative changes which are most prominent in the mid to lower lumbar spine bilaterally. Endplate osteophytes extend from the lower thoracic spine to S1. Aortic calcifications are noted. Soft tissues are otherwise unremarkable. Findings are similar to the previous lumbar spine radiographs. IMPRESSION: 1. No fracture or acute finding.  No spondylolisthesis.  2. Degenerative changes as described. Electronically Signed   By: Lajean Manes M.D.   On: 11/16/2019 12:52   DG Forearm Left  Result Date: 11/16/2019 CLINICAL DATA:  Fall 1 week ago.  Pain. EXAM: LEFT FOREARM - 2 VIEW COMPARISON:  None. FINDINGS: No fracture or bone lesion. Wrist and elbow joints are normally aligned. There is soft tissue swelling most focal in prominent along the radial aspect of the distal forearm, approximately 6 cm proximal to the wrist joint. IMPRESSION: 1. No fracture or dislocation. 2. Soft tissue swelling as detailed. Electronically Signed   By: Lajean Manes M.D.   On: 11/16/2019  12:48   CT ABDOMEN PELVIS W CONTRAST  Result Date: 11/16/2019 CLINICAL DATA:  Acute generalized abdominal pain.  Fall last week. EXAM: CT ABDOMEN AND PELVIS WITH CONTRAST TECHNIQUE: Multidetector CT imaging of the abdomen and pelvis was performed using the standard protocol following bolus administration of intravenous contrast. CONTRAST:  139m OMNIPAQUE IOHEXOL 300 MG/ML  SOLN COMPARISON:  08/05/2016 FINDINGS: Lower chest: Clear lung bases. Hepatobiliary: There is gallbladder wall thickening with hazy infiltration in the pericholecystic fat. No visualized gallstone. Gallbladder is mildly distended. No bile duct dilation. 4 mm low-attenuation lesion in the right lobe, segment 6, likely a cyst or hemangioma, but too small to characterized. This is not evident on the prior CT. No other liver masses or lesions. Liver normal in size. No liver contusion or laceration. Pancreas: Unremarkable. No pancreatic ductal dilatation or surrounding inflammatory changes. Spleen: Normal in size without focal abnormality. Adrenals/Urinary Tract: No adrenal masses. Kidneys normal size, orientation and position with symmetric enhancement and excretion. 7 mm posterior upper pole right renal cyst. No other renal masses, no stones and no hydronephrosis. Ureters are normal in course and in caliber. Bladder wall appears  thickened accentuated by lack of distension. No bladder mass or stone. Stomach/Bowel: Stomach is within normal limits. Appendix appears normal. No evidence of bowel wall thickening, distention, or inflammatory changes. Vascular/Lymphatic: Prominent gastrohepatic ligament and periceliac lymph nodes, largest 12 mm in short axis. These appear increased from the prior CT. No other adenopathy. Aortic atherosclerosis. No aneurysm. Reproductive: Unremarkable. Other: No abdominal wall hernia or abnormality. No abdominopelvic ascites. Musculoskeletal: No fracture or acute finding. No osteoblastic or osteolytic lesions. IMPRESSION: 1. Gallbladder wall thickening with hazy adjacent inflammation. Findings support acute cholecystitis, although no gallstone is seen. Recommend follow-up limited right upper quadrant ultrasound for further assessment. 2. Prominent to mildly enlarged Peri celiac and gastrohepatic ligament lymph nodes. These are most likely reactive. 3. Bladder wall appears thickened which is similar to the prior CT. Consider cystitis if there are consistent clinical findings. 4. No other evidence of an acute abnormality. 5. Aortic atherosclerosis. Electronically Signed   By: DLajean ManesM.D.   On: 11/16/2019 16:35   [place]domen Limited RUQ  Result Date: 11/16/2019 CLINICAL DATA:  Cholecystitis. Abdominal pain. Abnormal CT scan of the abdomen dated 11/16/2019 EXAM: ULTRASOUND ABDOMEN LIMITED RIGHT UPPER QUADRANT COMPARISON:  CT scan dated 11/16/2019 FINDINGS: Gallbladder: The gallbladder is contracted with a thick irregular wall. No discrete stones. Positive sonographic Murphy's sign. Small amount of pericholecystic fluid. Common bile duct: Diameter: 2 mm, normal. Liver: No focal lesion identified. Within normal limits in parenchymal echogenicity. Portal vein is patent on color Doppler imaging with normal direction of blood flow towards the liver. Other: None. IMPRESSION: Findings are consistent with acalculous  cholecystitis. Electronically Signed   By: JLorriane ShireM.D.   On: 11/16/2019 19:42    EKG: Not performed.  Assessment/Plan Principal Problem:   Transaminitis Active Problems:   Essential hypertension   Abscess of left upper extremity   Acalculous cholecystitis   Acute hepatitis A virus infection  Ronnie HURLBUTis a 58y.o. male with medical history significant for paraplegia due to spinal cord stroke, wheelchair-bound, hypertension, and chronic back pain who is admitted with transaminitis in setting of acute hepatitis A infection and a calculus cholecystitis.  Transaminitis in setting of acute hepatitis A viral infection: Hepatitis A IgM is reactive.  Acute hepatitis is likely cause of his transaminitis.  Serum acetaminophen level is undetectable.  Will continue  supportive care. -Continue IV fluid hydration -Hold Tylenol -Check HIV and hep B surface antibody (hep B surface antigen and core IgM are nonreactive) -GI consulted, appreciate further recommendations  Acalculous cholecystitis: Without evidence of gallstones on CT or ultrasound imaging.  CBD diameter is 2 mm.  He has continued RUQ abdominal pain.  Lipase is elevated without pancreatic ductal dilatation or inflammation on CT imaging. -Continue IV fluid resuscitation as above -Continue pain control and antiemetics as needed -Will keep n.p.o. after midnight -General surgery consulted, appreciate further recommendations -Will continue IV Zosyn for now  Abscess of left upper extremity: S/p I&D by EDP 11/16/2019.  Continue empiric IV vancomycin for now.  Hypertension: Currently stable, home medication reconciliation pending.  Chronic paraplegia secondary to spinal cord stroke: Chronic and unchanged from baseline.  DVT prophylaxis: SCDs Code Status: Full code, confirmed with patient Family Communication: Discussed with patient, he has discussed with family Disposition Plan: Pending clinical progress Consults called:  GI, general surgery Admission status: Inpatient, anticipate patient requires greater than 2 midnight length stay for management of acute hepatitis A, acalculous cholecystitis, and left upper extremity abscess.   Zada Finders MD [facility]  If 7PM-7AM, please contact night-coverage [email or web address]  11/16/2019, 11:35 PM

## 2019-11-16 NOTE — ED Notes (Signed)
Patient transported to X-ray 

## 2019-11-16 NOTE — ED Notes (Signed)
Pt unable to give urine sample

## 2019-11-16 NOTE — ED Triage Notes (Signed)
Arrives by ems s/p fall last week, was seen by ems, refused transport at that time, today reports pain to left forearm swelling, red, warm.  Denies fevers.  Left sided deficits from previous stroke, at baseline for patient.  Per ems, answers questions, follows commands.

## 2019-11-16 NOTE — ED Notes (Signed)
Dressing applied to left forearm.

## 2019-11-16 NOTE — ED Provider Notes (Signed)
  Physical Exam  BP (!) 144/87 (BP Location: Left Arm)   Pulse 74   Temp 98.3 F (36.8 C) (Oral)   Resp 18   Ht '5\' 8"'$  (1.727 m)   Wt 113.4 kg   SpO2 99%   BMI 38.01 kg/m   Physical Exam  ED Course/Procedures     .Critical Care Performed by: Gareth Morgan, MD Authorized by: Gareth Morgan, MD   Critical care provider statement:    Critical care time (minutes):  45   Critical care was necessary to treat or prevent imminent or life-threatening deterioration of the following conditions:  Hepatic failure   Critical care was time spent personally by me on the following activities:  Discussions with consultants, evaluation of patient's response to treatment, examination of patient, ordering and performing treatments and interventions, ordering and review of laboratory studies, ordering and review of radiographic studies, pulse oximetry, re-evaluation of patient's condition, obtaining history from patient or surrogate and review of old charts    MDM  Received care of patient at 3:30 PM.  Please see previous note for history and physical exam.  Briefly this is a 58 year old male with a history of spinal CVA with paraplegia, wheelchair-bound, who fell from his chair approximately 1 week ago injuring his left forearm, and also hitting the right side of his back.  Labs significant for severe transaminitis with AST of 2300, bilirubin of 3.9, alk phos of 132 and lipase of 198.   Reports he takes approximately 6 '500mg'$  tablets of acetaminophen per day and has been drinking etoh 1 pint every other day over the last week. Pain has been slowly increasing since the fall.  Tylenol level negative in ED, doubt this as etiology of pain.  DDx includes traumatic transaminitis with liver injury or other underlying medical etiology. CT abdomen/pelvis pending. Given medications for pain.  CT shows GB wall thickening without stones, normal CBD.  Ordered RUQ [place], mixed picture of etiology of symptoms, not  classically cholecystitis by history and labs, has elevation in bilirubin and significant transaminitis as well as pancreatitis.  Will admit to medicine with General Surgery consulting for possible cholecystitis, GI consulting given significant transaminis, elevation in lipase, bilirubin, consider biliary obstruction although CBD appears normal on CT. They will evaluate in AM, do not see indication for emergent MRCP and will continue to follow labs.  Will given abx, transfer for further care.      Gareth Morgan, MD 11/17/19 [phone]

## 2019-11-16 NOTE — Progress Notes (Signed)
Pharmacy Antibiotic Note  Ronnie Moore is a 58 y.o. male admitted on 11/16/2019 with cholecystitis and arm abcess (w/ hx of MRSA) .  Pharmacy has been consulted for Zosyn and Vancomycin dosing.   Height: 5\' 8"  (172.7 cm) Weight: 250 lb (113.4 kg) IBW/kg (Calculated) : 68.4  Temp (24hrs), Avg:98.3 F (36.8 C), Min:98.3 F (36.8 C), Max:98.3 F (36.8 C)  Recent Labs  Lab 11/16/19 1300  WBC 3.2*  CREATININE 0.69    Estimated Creatinine Clearance: 126 mL/min (by C-G formula based on SCr of 0.69 mg/dL).    Allergies  Allergen Reactions  . Metaxalone Swelling    Reaction to Skelaxin - lips swelled    Antimicrobials this admission: 12/11 Zosyn >>  12/11 Vancomycin >>   Dose adjustments this admission:   Microbiology results:  Plan:  - Zosyn 3.375g IV x 1 dose infused over 30 min followed by Zosyn 3.375 IV q8h infused over 4 hours  - Vancomycin 2000 mg IV x 1 dose  - Followed by Vancomycin 15000mg  IV q12h  - Est calc AUC 485.5  - Monitor patient's renal function and urine output.   Thank you for allowing pharmacy to be a part of this patient's care.  Duanne Limerick PharmD. BCPS 11/16/2019 7:54 PM

## 2019-11-16 NOTE — Consult Note (Deleted)
Reason for Consult: RUQ pain Referring Physician:   FARAH BENISH is an 58 y.o. male.  HPI: Pt presents to [facility] with R flank pain.  S/p fall 1 wk ago.     Past Medical History:  Diagnosis Date   Cancer ([place])    Chronic back pain    Hypertension    Stroke [facility])     Past Surgical History:  Procedure Laterality Date   BACK SURGERY     I&D EXTREMITY Right 06/07/2016   Procedure: IRRIGATION AND DEBRIDEMENT WRIST;  Surgeon: Dayna Barker, MD;  Location: [place];  Service: Plastics;  Laterality: Right;   KNEE SURGERY      Family History  Problem Relation Age of Onset   Arthritis Mother    Hypertension Father    Heart disease Father    Diabetes Mellitus II Sister     Social History:  reports that he has been smoking. He has been smoking about 0.50 packs per day. He has never used smokeless tobacco. He reports previous alcohol use. He reports that he does not use drugs.  Allergies:  Allergies  Allergen Reactions   Metaxalone Swelling    Reaction to Skelaxin - lips swelled    Medications: I have reviewed the patient's current medications.  Results for orders placed or performed during the hospital encounter of 11/16/19 (from the past 48 hour(s))  Comprehensive metabolic panel     Status: Abnormal   Collection Time: 11/16/19  1:00 PM  Result Value Ref Range   Sodium 135 135 - 145 mmol/L   Potassium 3.8 3.5 - 5.1 mmol/L   Chloride 101 98 - 111 mmol/L   CO2 20 (L) 22 - 32 mmol/L   Glucose, Bld 89 70 - 99 mg/dL   BUN 10 6 - 20 mg/dL   Creatinine, Ser 0.69 0.61 - 1.24 mg/dL   Calcium 8.8 (L) 8.9 - 10.3 mg/dL   Total Protein 7.3 6.5 - 8.1 g/dL   Albumin 3.5 3.5 - 5.0 g/dL   AST 2,300 (H) 15 - 41 U/L    Comment: RESULTS CONFIRMED BY MANUAL DILUTION   ALT 1,756 (H) 0 - 44 U/L   Alkaline Phosphatase 132 (H) 38 - 126 U/L   Total Bilirubin 3.9 (H) 0.3 - 1.2 mg/dL   GFR calc non Af Amer >60 >60 mL/min   GFR calc Af Amer >60 >60 mL/min   Anion gap 14 5 - 15    Comment: Performed  at [facility], [address] [place], [place] [address]  CBC with Differential     Status: Abnormal   Collection Time: 11/16/19  1:00 PM  Result Value Ref Range   WBC 3.2 (L) 4.0 - 10.5 K/uL   RBC 5.26 4.22 - 5.81 MIL/uL   Hemoglobin 15.6 13.0 - 17.0 g/dL   HCT 48.1 39.0 - 52.0 %   MCV 91.4 80.0 - 100.0 fL   MCH 29.7 26.0 - 34.0 pg   MCHC 32.4 30.0 - 36.0 g/dL   RDW 13.8 11.5 - 15.5 %   Platelets 131 (L) 150 - 400 K/uL    Comment: REPEATED TO VERIFY SPECIMEN CHECKED FOR CLOTS    nRBC 0.0 0.0 - 0.2 %   Neutrophils Relative % 40 %   Neutro Abs 1.3 (L) 1.7 - 7.7 K/uL   Lymphocytes Relative 48 %   Lymphs Abs 1.5 0.7 - 4.0 K/uL   Monocytes Relative 10 %   Monocytes Absolute 0.3 0.1 - 1.0  K/uL   Eosinophils Relative 1 %   Eosinophils Absolute 0.0 0.0 - 0.5 K/uL   Basophils Relative 1 %   Basophils Absolute 0.0 0.0 - 0.1 K/uL   Immature Granulocytes 0 %   Abs Immature Granulocytes 0.01 0.00 - 0.07 K/uL    Comment: Performed at [facility], [address] [place], [place] [address]  Lipase, blood     Status: Abnormal   Collection Time: 11/16/19  2:32 PM  Result Value Ref Range   Lipase 198 (H) 11 - 51 U/L    Comment: Performed at [facility], [address] [place], [place] [address]  Urinalysis, Routine w reflex microscopic     Status: Abnormal   Collection Time: 11/16/19  3:20 PM  Result Value Ref Range   Color, Urine AMBER (A) YELLOW    Comment: BIOCHEMICALS MAY BE AFFECTED BY COLOR   APPearance CLEAR CLEAR   Specific Gravity, Urine 1.020 1.005 - 1.030   pH 6.0 5.0 - 8.0   Glucose, UA NEGATIVE NEGATIVE mg/dL   Hgb urine dipstick NEGATIVE NEGATIVE   Bilirubin Urine MODERATE (A) NEGATIVE   Ketones, ur 15 (A) NEGATIVE mg/dL   Protein, ur NEGATIVE NEGATIVE mg/dL   Nitrite NEGATIVE NEGATIVE   Leukocytes,Ua NEGATIVE NEGATIVE    Comment: Microscopic not done on urines with negative protein, blood, leukocytes, nitrite, or glucose < 500  mg/dL. Performed at [facility], [address] [place], [place] [address]   Acetaminophen level     Status: Abnormal   Collection Time: 11/16/19  3:51 PM  Result Value Ref Range   Acetaminophen (Tylenol), Serum <10 (L) 10 - 30 ug/mL    Comment: Performed at [facility], [address] [place], [place] [address]  SARS Coronavirus 2 Ag (30 min TAT) - Nasal Swab (BD Veritor Kit)     Status: None   Collection Time: 11/16/19  3:51 PM   Specimen: Nasal Swab (BD Veritor Kit)  Result Value Ref Range   SARS Coronavirus 2 Ag NEGATIVE NEGATIVE    Comment: (NOTE) SARS-CoV-2 antigen NOT DETECTED.  Negative results are presumptive.  Negative results do not preclude SARS-CoV-2 infection and should not be used as the sole basis for treatment or other patient management decisions, including infection  control decisions, particularly in the presence of clinical signs and  symptoms consistent with COVID-19, or in those who have been in contact with the virus.  Negative results must be combined with clinical observations, patient history, and epidemiological information. The expected result is Negative. Fact Sheet for Patients: PodPark.tn Fact Sheet for Healthcare Providers: GiftContent.is This test is not yet approved or cleared by the [place] FDA and  has been authorized for detection and/or diagnosis of SARS-CoV-2 by FDA under an Emergency Use Authorization (EUA).  This EUA will remain in effect (meaning this test can be used) for the duration of  the COVID-19 de claration under Section 564(b)(1) of the Act, 21 U.S.C. section 360bbb-3(b)(1), unless the authorization is terminated or revoked sooner. Performed at [facility], [address] [place], [place] [address]   Ethanol     Status: None   Collection Time: 11/16/19  3:51 PM  Result Value Ref Range   Alcohol, Ethyl (B) <10 <10 mg/dL     Comment: (NOTE) Lowest detectable limit for serum alcohol is 10 mg/dL. For medical purposes only. Performed at [facility], [address] [place], [place] [address]   Protime-INR     Status: None   Collection Time: 11/16/19  3:51 PM  Result Value Ref Range   Prothrombin Time 14.7 11.4 - 15.2 seconds   INR 1.2 0.8 - 1.2    Comment: (NOTE) INR goal varies based on device and disease states. Performed at [facility], [address], [place], [place] [address]     DG Chest 2 View  Result Date: 11/16/2019 CLINICAL DATA:  [place] 1 week ago.  Pain.  Smoker. EXAM: CHEST - 2 VIEW COMPARISON:  05/19/2017. FINDINGS: Cardiac silhouette is normal in size. No mediastinal or hilar masses or evidence of adenopathy. Clear lungs.  No pleural effusion or pneumothorax. Skeletal structures are intact. IMPRESSION: No active cardiopulmonary disease. Electronically Signed   By: Lajean Manes M.D.   On: 11/16/2019 12:50   DG Lumbar Spine Complete  Result Date: 11/16/2019 CLINICAL DATA:  [place] 1 week ago.  Pain. EXAM: LUMBAR SPINE - COMPLETE 4+ VIEW COMPARISON:  06/27/2017. FINDINGS: No fracture, bone lesion or spondylolisthesis. Mild loss of disc height at L4-L5 and L5-S1. There are facet degenerative changes which are most prominent in the mid to lower lumbar spine bilaterally. Endplate osteophytes extend from the lower thoracic spine to S1. Aortic calcifications are noted. Soft tissues are otherwise unremarkable. Findings are similar to the previous lumbar spine radiographs. IMPRESSION: 1. No fracture or acute finding.  No spondylolisthesis. 2. Degenerative changes as described. Electronically Signed   By: Lajean Manes M.D.   On: 11/16/2019 12:52   DG Forearm Left  Result Date: 11/16/2019 CLINICAL DATA:  Fall 1 week ago.  Pain. EXAM: LEFT FOREARM - 2 VIEW COMPARISON:  None. FINDINGS: No fracture or bone lesion. Wrist and elbow joints are normally aligned. There is soft tissue swelling  most focal in prominent along the radial aspect of the distal forearm, approximately 6 cm proximal to the wrist joint. IMPRESSION: 1. No fracture or dislocation. 2. Soft tissue swelling as detailed. Electronically Signed   By: Lajean Manes M.D.   On: 11/16/2019 12:48   CT ABDOMEN PELVIS W CONTRAST  Result Date: 11/16/2019 CLINICAL DATA:  Acute generalized abdominal pain.  Fall last week. EXAM: CT ABDOMEN AND PELVIS WITH CONTRAST TECHNIQUE: Multidetector CT imaging of the abdomen and pelvis was performed using the standard protocol following bolus administration of intravenous contrast. CONTRAST:  181m OMNIPAQUE IOHEXOL 300 MG/ML  SOLN COMPARISON:  08/05/2016 FINDINGS: Lower chest: Clear lung bases. Hepatobiliary: There is gallbladder wall thickening with hazy infiltration in the pericholecystic fat. No visualized gallstone. Gallbladder is mildly distended. No bile duct dilation. 4 mm low-attenuation lesion in the right lobe, segment 6, likely a cyst or hemangioma, but too small to characterized. This is not evident on the prior CT. No other liver masses or lesions. Liver normal in size. No liver contusion or laceration. Pancreas: Unremarkable. No pancreatic ductal dilatation or surrounding inflammatory changes. Spleen: Normal in size without focal abnormality. Adrenals/Urinary Tract: No adrenal masses. Kidneys normal size, orientation and position with symmetric enhancement and excretion. 7 mm posterior upper pole right renal cyst. No other renal masses, no stones and no hydronephrosis. Ureters are normal in course and in caliber. Bladder wall appears thickened accentuated by lack of distension. No bladder mass or stone. Stomach/Bowel: Stomach is within normal limits. Appendix appears normal. No evidence of bowel wall thickening, distention, or inflammatory changes. Vascular/Lymphatic: Prominent gastrohepatic ligament and periceliac lymph nodes, largest 12 mm in short axis.  These appear increased from the  prior CT. No other adenopathy. Aortic atherosclerosis. No aneurysm. Reproductive: Unremarkable. Other: No abdominal wall hernia or abnormality. No abdominopelvic ascites. Musculoskeletal: No fracture or acute finding. No osteoblastic or osteolytic lesions. IMPRESSION: 1. Gallbladder wall thickening with hazy adjacent inflammation. Findings support acute cholecystitis, although no gallstone is seen. Recommend follow-up limited right upper quadrant ultrasound for further assessment. 2. Prominent to mildly enlarged Peri celiac and gastrohepatic ligament lymph nodes. These are most likely reactive. 3. Bladder wall appears thickened which is similar to the prior CT. Consider cystitis if there are consistent clinical findings. 4. No other evidence of an acute abnormality. 5. Aortic atherosclerosis. Electronically Signed   By: Lajean Manes M.D.   On: 11/16/2019 16:35    Review of Systems Blood pressure 136/77, pulse 71, temperature 98.3 F (36.8 C), temperature source Oral, resp. rate 19, height '5\' 8"'$  (1.727 m), weight 113.4 kg, SpO2 95 %. Physical Exam  Assessment/Plan:   Rosario Adie 50/53/9767, 7:11 PM

## 2019-11-16 NOTE — Plan of Care (Addendum)
58 yo M with paraplegia due to spinal cord stroke.  [place] out of wheelchair X1 week ago, landed on L forearm, had something stuck in L forearm which was removed by EMS, pt didn't go to ED then.  Swelling around wound since then, also RUQ abd pain since then.  In ED today: 1) Forearm abscess, drained by EDP 2) LFTs markedly elevated, AST 2300, ALT 1700, ALK 132, Lipase 198.  WBC 3.2. -> CT shows findings suspicious for acute cholecystitis.  [place] pending to look at duct.  Does take 3g tylenol a day chronically though tylenol level today is <10, does drink some EtOH chronically.  GI and surgery consulted.  Repeat LFTs and they will see in AM.  Asked EDP to start ABx for cholecystitis + Vanc (given forearm abscess that was just drained today as well as h/o MRSA in his chart).  Patient accepted to [facility].

## 2019-11-16 NOTE — ED Provider Notes (Signed)
[place] EMERGENCY DEPARTMENT Provider Note   CSN: [id number] Arrival date & time: 11/16/19  1148     History Chief Complaint  Patient presents with  . Fall    URIJAH Moore is a 58 y.o. male.  Pt presents to the ED today with left forearm pain.  The pt has a hx of a stroke wheelchair bound and fell 1 week ago.  He landed on his left forearm.  He called EMS when he fell.  He had something stuck in 1 of the wounds which EMS removed.  He did not go to the hospital then.  He has had some swelling around the wound which had the FB.  The pt has aches all over his body.  He has chronic back pain, but it is a little worse than usual.  The pt denies any known covid exposures.  No fevers.          Past Medical History:  Diagnosis Date  . Cancer ([place])   . Chronic back pain   . Hypertension   . Stroke [facility])     Patient Active Problem List   Diagnosis Date Noted  . Cellulitis 06/06/2016  . Cellulitis of right upper extremity 06/06/2016  . Hypokalemia 06/06/2016  . Essential hypertension 06/06/2016  . Abnormal LFTs 06/06/2016  . Chronic back pain 06/06/2016    Past Surgical History:  Procedure Laterality Date  . BACK SURGERY    . I&D EXTREMITY Right 06/07/2016   Procedure: IRRIGATION AND DEBRIDEMENT WRIST;  Surgeon: Dayna Barker, MD;  Location: [place];  Service: Plastics;  Laterality: Right;  . KNEE SURGERY         Family History  Problem Relation Age of Onset  . Arthritis Mother   . Hypertension Father   . Heart disease Father   . Diabetes Mellitus II Sister     Social History   Tobacco Use  . Smoking status: Current Every Day Smoker    Packs/day: 0.50  . Smokeless tobacco: Never Used  Substance Use Topics  . Alcohol use: Not Currently    Comment: occ  . Drug use: No    Home Medications Prior to Admission medications   Medication Sig Start Date End Date Taking? Authorizing Provider  docusate sodium (COLACE) 50 MG capsule Take 1 capsule (50 mg  total) by mouth 2 (two) times daily. 06/09/16   Reyne Dumas, MD  hydrochlorothiazide (HYDRODIURIL) 25 MG tablet Take 25 mg by mouth daily. 10/29/19   [provider]  LORazepam (ATIVAN) 2 MG tablet Take 1 tablet (2 mg total) by mouth 3 (three) times daily. Patient not taking: Reported on 06/06/2016 08/30/12   Leonard Schwartz, MD  meloxicam (MOBIC) 15 MG tablet Take 1 tablet (15 mg total) by mouth daily. Patient not taking: Reported on 06/06/2016 08/30/12   Leonard Schwartz, MD  nicotine (NICODERM CQ - DOSED IN MG/24 HOURS) 21 mg/24hr patch Place 1 patch (21 mg total) onto the skin daily. 06/09/16   Reyne Dumas, MD  Olmesartan-Amlodipine-HCTZ (TRIBENZOR) 40-10-25 MG TABS Take 1 tablet by mouth daily.    [provider]  Oxycodone HCl 20 MG TABS Take 20 mg by mouth every 4 (four) hours. 05/24/16   [provider]  pantoprazole (PROTONIX) 40 MG tablet Take 1 tablet (40 mg total) by mouth daily. 06/09/16   Reyne Dumas, MD  polyethylene glycol (MIRALAX / GLYCOLAX) packet Take 17 g by mouth 2 (two) times daily. 06/09/16   Reyne Dumas, MD  potassium  chloride 20 MEQ/15ML (10%) SOLN Take 30 mLs (40 mEq total) by mouth daily. 06/09/16   Reyne Dumas, MD  sulfamethoxazole-trimethoprim (BACTRIM DS,SEPTRA DS) 800-160 MG tablet Take 1 tablet by mouth every 12 (twelve) hours. 06/10/16   Reyne Dumas, MD  temazepam (RESTORIL) 15 MG capsule Take 1 capsule (15 mg total) by mouth at bedtime as needed for sleep. 06/09/16   Reyne Dumas, MD    Allergies    Metaxalone  Review of Systems   Review of Systems  Musculoskeletal:       Left forearm pain  Skin: Positive for wound.  All other systems reviewed and are negative.   Physical Exam Updated Vital Signs BP (!) 144/87 (BP Location: Left Arm)   Pulse 74   Temp 98.3 F (36.8 C) (Oral)   Resp 18   Ht 5\' 8"  (1.727 m)   Wt 113.4 kg   SpO2 99%   BMI 38.01 kg/m   Physical Exam Vitals and nursing note reviewed.  Constitutional:       Appearance: Normal appearance.  HENT:     Head: Normocephalic and atraumatic.     Right Ear: External ear normal.     Left Ear: External ear normal.     Nose: Nose normal.     Mouth/Throat:     Mouth: Mucous membranes are moist.     Pharynx: Oropharynx is clear.  Eyes:     Extraocular Movements: Extraocular movements intact.     Conjunctiva/sclera: Conjunctivae normal.     Pupils: Pupils are equal, round, and reactive to light.  Cardiovascular:     Rate and Rhythm: Normal rate and regular rhythm.     Pulses: Normal pulses.     Heart sounds: Normal heart sounds.  Pulmonary:     Effort: Pulmonary effort is normal.     Breath sounds: Normal breath sounds.  Abdominal:     General: Abdomen is flat. Bowel sounds are normal.     Palpations: Abdomen is soft.     Tenderness: There is generalized abdominal tenderness.  Musculoskeletal:     Cervical back: Normal range of motion and neck supple.     Comments: Left forearm pain  Skin:    Comments: Multiple abrasions to left forearm.  Abscess distal forearm.  Neurological:     Mental Status: He is alert.     ED Results / Procedures / Treatments   Labs (all labs ordered are listed, but only abnormal results are displayed) Labs Reviewed  COMPREHENSIVE METABOLIC PANEL - Abnormal; Notable for the following components:      Result Value   CO2 20 (*)    Calcium 8.8 (*)    AST 2,300 (*)    ALT 1,756 (*)    Alkaline Phosphatase 132 (*)    Total Bilirubin 3.9 (*)    All other components within normal limits  CBC WITH DIFFERENTIAL/PLATELET - Abnormal; Notable for the following components:   WBC 3.2 (*)    Platelets 131 (*)    Neutro Abs 1.3 (*)    All other components within normal limits  LIPASE, BLOOD - Abnormal; Notable for the following components:   Lipase 198 (*)    All other components within normal limits  SARS CORONAVIRUS 2 (TAT 6-24 HRS)  URINALYSIS, ROUTINE W REFLEX MICROSCOPIC  HEPATITIS PANEL, ACUTE     EKG None  Radiology DG Chest 2 View  Result Date: 11/16/2019 CLINICAL DATA:  [place] 1 week ago.  Pain.  Smoker. EXAM: CHEST - 2  VIEW COMPARISON:  05/19/2017. FINDINGS: Cardiac silhouette is normal in size. No mediastinal or hilar masses or evidence of adenopathy. Clear lungs.  No pleural effusion or pneumothorax. Skeletal structures are intact. IMPRESSION: No active cardiopulmonary disease. Electronically Signed   By: Lajean Manes M.D.   On: 11/16/2019 12:50   DG Lumbar Spine Complete  Result Date: 11/16/2019 CLINICAL DATA:  [place] 1 week ago.  Pain. EXAM: LUMBAR SPINE - COMPLETE 4+ VIEW COMPARISON:  06/27/2017. FINDINGS: No fracture, bone lesion or spondylolisthesis. Mild loss of disc height at L4-L5 and L5-S1. There are facet degenerative changes which are most prominent in the mid to lower lumbar spine bilaterally. Endplate osteophytes extend from the lower thoracic spine to S1. Aortic calcifications are noted. Soft tissues are otherwise unremarkable. Findings are similar to the previous lumbar spine radiographs. IMPRESSION: 1. No fracture or acute finding.  No spondylolisthesis. 2. Degenerative changes as described. Electronically Signed   By: Lajean Manes M.D.   On: 11/16/2019 12:52   DG Forearm Left  Result Date: 11/16/2019 CLINICAL DATA:  Fall 1 week ago.  Pain. EXAM: LEFT FOREARM - 2 VIEW COMPARISON:  None. FINDINGS: No fracture or bone lesion. Wrist and elbow joints are normally aligned. There is soft tissue swelling most focal in prominent along the radial aspect of the distal forearm, approximately 6 cm proximal to the wrist joint. IMPRESSION: 1. No fracture or dislocation. 2. Soft tissue swelling as detailed. Electronically Signed   By: Lajean Manes M.D.   On: 11/16/2019 12:48    Procedures .Marland KitchenIncision and Drainage  Date/Time: 11/16/2019 1:13 PM Performed by: Isla Pence, MD Authorized by: Isla Pence, MD   Consent:    Consent obtained:  Verbal   Consent given by:   Patient   Risks discussed:  Bleeding, incomplete drainage and pain   Alternatives discussed:  No treatment Location:    Type:  Abscess   Size:  4   Location:  Upper extremity   Upper extremity location:  Arm   Arm location:  L upper arm Pre-procedure details:    Skin preparation:  Betadine Anesthesia (see MAR for exact dosages):    Anesthesia method:  Local infiltration   Local anesthetic:  Lidocaine 2% WITH epi Procedure type:    Complexity:  Simple Procedure details:    Incision types:  Elliptical   Scalpel blade:  11   Wound management:  Probed and deloculated and irrigated with saline   Drainage:  Purulent   Drainage amount:  Moderate   Wound treatment:  Wound left open   Packing materials:  None Post-procedure details:    Patient tolerance of procedure:  Tolerated well, no immediate complications   (including critical care time)  Medications Ordered in ED Medications  HYDROmorphone (DILAUDID) injection 1 mg (has no administration in time range)  morphine 4 MG/ML injection 4 mg (4 mg Intravenous Given 11/16/19 1257)  ondansetron (ZOFRAN) injection 4 mg (4 mg Intravenous Given 11/16/19 1257)  lidocaine-EPINEPHrine (XYLOCAINE W/EPI) 2 %-1:200000 (PF) injection 10 mL (10 mLs Infiltration Given by Other 11/16/19 1205)  Tdap (BOOSTRIX) injection 0.5 mL (0.5 mLs Intramuscular Given 11/16/19 1203)    ED Course  I have reviewed the triage vital signs and the nursing notes.  Pertinent labs & imaging results that were available during my care of the patient were reviewed by me and considered in my medical decision making (see chart for details).    MDM Rules/Calculators/A&P     CHA2DS2/VAS Stroke Risk Points  N/A >= 2 Points: High Risk  1 - 1.99 Points: Medium Risk  0 Points: Low Risk    A final score could not be computed because of missing components.: Last  Change: N/A     This score determines the patient's risk of having a stroke if the  patient has atrial  fibrillation.      This score is not applicable to this patient. Components are not  calculated.                  Pt's LFTs are elevated, so I ordered a CT abd/pelvis.  This is pending at shift change.  Pt signed out to Dr. Billy Fischer.  Final Clinical Impression(s) / ED Diagnoses Final diagnoses:  Abscess of left arm  Fall, initial encounter  Elevated LFTs  Multiple abrasions    Rx / DC Orders ED Discharge Orders    None       Isla Pence, MD 11/16/19 1520

## 2019-11-16 NOTE — ED Notes (Signed)
Pt remains in xr

## 2019-11-16 NOTE — ED Notes (Signed)
PT attempting to give urine sample.

## 2019-11-16 NOTE — ED Notes (Signed)
ED Provider at bedside. 

## 2019-11-17 LAB — BASIC METABOLIC PANEL
Anion gap: 9 (ref 5–15)
BUN: 7 mg/dL (ref 6–20)
CO2: 24 mmol/L (ref 22–32)
Calcium: 8.4 mg/dL — ABNORMAL LOW (ref 8.9–10.3)
Chloride: 104 mmol/L (ref 98–111)
Creatinine, Ser: 0.83 mg/dL (ref 0.61–1.24)
GFR calc Af Amer: >60 mL/min (ref >60)
GFR calc non Af Amer: >60 mL/min (ref >60)
Glucose, Bld: 126 mg/dL — ABNORMAL HIGH (ref 70–99)
Potassium: 3.7 mmol/L (ref 3.5–5.1)
Sodium: 137 mmol/L (ref 135–145)

## 2019-11-17 LAB — HEPATIC FUNCTION PANEL
ALT: 1486 U/L — ABNORMAL HIGH (ref 0–44)
AST: 1680 U/L — ABNORMAL HIGH (ref 15–41)
Albumin: 3 g/dL — ABNORMAL LOW (ref 3.5–5.0)
Alkaline Phosphatase: 107 U/L (ref 38–126)
Bilirubin, Direct: 2.5 mg/dL — ABNORMAL HIGH (ref 0.0–0.2)
Indirect Bilirubin: 1.5 mg/dL — ABNORMAL HIGH (ref 0.3–0.9)
Total Bilirubin: 4 mg/dL — ABNORMAL HIGH (ref 0.3–1.2)
Total Protein: 6.6 g/dL (ref 6.5–8.1)

## 2019-11-17 LAB — CBC
HCT: 44.3 % (ref 39.0–52.0)
Hemoglobin: 14.4 g/dL (ref 13.0–17.0)
MCH: 29.4 pg (ref 26.0–34.0)
MCHC: 32.5 g/dL (ref 30.0–36.0)
MCV: 90.6 fL (ref 80.0–100.0)
Platelets: 188 10*3/uL (ref 150–400)
RBC: 4.89 MIL/uL (ref 4.22–5.81)
RDW: 13.9 % (ref 11.5–15.5)
WBC: 2.8 10*3/uL — ABNORMAL LOW (ref 4.0–10.5)
nRBC: 0 % (ref 0.0–0.2)

## 2019-11-17 LAB — MRSA PCR SCREENING: MRSA by PCR: NEGATIVE

## 2019-11-17 LAB — HEPATITIS PANEL, ACUTE
HCV Ab: REACTIVE — AB
Hep A IgM: REACTIVE — AB
Hep B C IgM: NONREACTIVE
Hepatitis B Surface Ag: NONREACTIVE

## 2019-11-17 LAB — CK: Total CK: 52 U/L (ref 49–397)

## 2019-11-17 LAB — HIV ANTIBODY (ROUTINE TESTING W REFLEX): HIV Screen 4th Generation wRfx: NONREACTIVE

## 2019-11-17 MED ORDER — LORATADINE 10 MG PO TABS
10.0000 mg | ORAL_TABLET | Freq: Every day | ORAL | Status: DC
  Administered 2019-11-18 – 2019-11-28 (×11): 10 mg via ORAL
  Filled 2019-11-17 (×11): qty 1

## 2019-11-17 MED ORDER — HYDROMORPHONE HCL 1 MG/ML IJ SOLN
1.0000 mg | Freq: Once | INTRAMUSCULAR | Status: AC
  Administered 2019-11-17: 1 mg via INTRAVENOUS
  Filled 2019-11-17: qty 1

## 2019-11-17 MED ORDER — NICOTINE 14 MG/24HR TD PT24
14.0000 mg | MEDICATED_PATCH | Freq: Every day | TRANSDERMAL | Status: DC
  Administered 2019-11-17 – 2019-11-28 (×12): 14 mg via TRANSDERMAL
  Filled 2019-11-17 (×12): qty 1

## 2019-11-17 MED ORDER — SODIUM CHLORIDE 0.9 % IV SOLN
INTRAVENOUS | Status: DC | PRN
  Administered 2019-11-17: 1000 mL via INTRAVENOUS

## 2019-11-17 MED ORDER — SALINE SPRAY 0.65 % NA SOLN: 1.0000 | NASAL | Status: DC | PRN

## 2019-11-17 MED ORDER — OXYCODONE HCL 5 MG PO TABS
10.0000 mg | ORAL_TABLET | ORAL | Status: DC | PRN
  Administered 2019-11-17 – 2019-11-18 (×5): 10 mg via ORAL
  Filled 2019-11-17 (×5): qty 2

## 2019-11-17 MED ORDER — SODIUM CHLORIDE 0.9 % IV SOLN
INTRAVENOUS | Status: AC
  Administered 2019-11-17: 16:00:00 via INTRAVENOUS

## 2019-11-17 NOTE — Consult Note (Addendum)
Referring Provider:  [facility]         Primary Care Physician:  Patient, No Pcp Per Primary Gastroenterologist:  unssigned           Reason for Consultation:   Abnormal liver tests                ASSESSMENT /  PLAN    1. Abnormal liver tests RUQ pain. His HAV IgM is positive. Appears he has chronic HCV as well. RUQ ultrasound suggesting acalculus cholecystitis.  Surgery has evaluated, feels like gallbladder wall thickening is secondary to acute hepatitis.  Not recommending further gallbladder work-up in the setting of acute viral hepatitis. The degree of RUQ pain seems unusual for viral hepatitis -supportive care -sounds like he underwent treatment for HCV at some point but was told "it would always be there". Will check HCV RNA PCR -Hungry. Will advance to regular diet.  2. Prominent gastrohepatic ligament and peri-celiac lymph nodes, largest 12 mm in short axis. These appear increased from the prior CT scan.   3. Forearm abscess, drained in ED. On antibiotics.    HPI:     Ronnie Moore is a 58 y.o. male with pmh of paraplegia from spinal cord CVA,  HTN Brought by EMS to ED yesterday for evaluation of LUE swelling / wound following a fall last week. There was a foreign body in the wound, EMS removed it. Over the last couple of days he has developed generalized body aches and abdominal pain. He had an episode of nausea / vomiting at home. Some diarrhea, history is vague. His main complaint is that of RUQ pain. Has shooting pain occurring randomly and it is uncomfortable to lay on right side. This pain is new, started a couple of days ago. No other GI complaints. He is hungry. Recalls eating what appeared to be inadequately cook beef but that was a few days after Thanksgiving.  .   ED labs:  Alk phos 132, AST 2300, ALT 1756, Total bilirubin 3.9.  RUQ  - acalculous cholecystitis.  Gallbladder is contracted with a thick irregular wall.  No discrete stones.  Small amount of  pericholecystic fluid  Today's labs Alk phos 107, AST 1680, ALT 1406, T bili 4.0.    Past Medical History:  Diagnosis Date  . Cancer ([place])   . Chronic back pain   . Hypertension   . Stroke [facility])     Past Surgical History:  Procedure Laterality Date  . BACK SURGERY    . I&D EXTREMITY Right 06/07/2016   Procedure: IRRIGATION AND DEBRIDEMENT WRIST;  Surgeon: Dayna Barker, MD;  Location: [place];  Service: Plastics;  Laterality: Right;  . KNEE SURGERY      Prior to Admission medications   Medication Sig Start Date End Date Taking? Authorizing Provider  amLODipine (NORVASC) 5 MG tablet Take 5 mg by mouth daily. 10/29/19  Yes [provider]  DULoxetine (CYMBALTA) 60 MG capsule Take 120 mg by mouth at bedtime. 10/29/19  Yes [provider]  gabapentin (NEURONTIN) 600 MG tablet Take 1,200 mg by mouth 3 (three) times daily. 10/29/19  Yes [provider]  hydrochlorothiazide (HYDRODIURIL) 25 MG tablet Take 25 mg by mouth daily. 10/29/19  Yes [provider]  losartan (COZAAR) 100 MG tablet Take 100 mg by mouth daily. 10/29/19  Yes [provider]  pantoprazole (PROTONIX) 40 MG tablet Take 1 tablet (40 mg total) by mouth daily. 06/09/16  Yes Reyne Dumas, MD  tizanidine (ZANAFLEX) 6 MG capsule Take 6-12 mg by mouth every 8 (eight) hours. 10/29/19  Yes [provider]  docusate sodium (COLACE) 50 MG capsule Take 1 capsule (50 mg total) by mouth 2 (two) times daily. 06/09/16   Reyne Dumas, MD  LORazepam (ATIVAN) 2 MG tablet Take 1 tablet (2 mg total) by mouth 3 (three) times daily. Patient not taking: Reported on 06/06/2016 08/30/12   Leonard Schwartz, MD  meloxicam (MOBIC) 15 MG tablet Take 1 tablet (15 mg total) by mouth daily. Patient not taking: Reported on 06/06/2016 08/30/12   Leonard Schwartz, MD  nicotine (NICODERM CQ - DOSED IN MG/24 HOURS) 21 mg/24hr patch Place 1 patch (21 mg total) onto the skin daily. 06/09/16   Reyne Dumas, MD    Olmesartan-Amlodipine-HCTZ (TRIBENZOR) 40-10-25 MG TABS Take 1 tablet by mouth daily.    [provider]  Oxycodone HCl 20 MG TABS Take 20 mg by mouth every 4 (four) hours. 05/24/16   [provider]  polyethylene glycol (MIRALAX / GLYCOLAX) packet Take 17 g by mouth 2 (two) times daily. 06/09/16   Reyne Dumas, MD  potassium chloride 20 MEQ/15ML (10%) SOLN Take 30 mLs (40 mEq total) by mouth daily. 06/09/16   Reyne Dumas, MD  sulfamethoxazole-trimethoprim (BACTRIM DS,SEPTRA DS) 800-160 MG tablet Take 1 tablet by mouth every 12 (twelve) hours. 06/10/16   Reyne Dumas, MD  temazepam (RESTORIL) 15 MG capsule Take 1 capsule (15 mg total) by mouth at bedtime as needed for sleep. 06/09/16   Reyne Dumas, MD    Current Facility-Administered Medications  Medication Dose Route Frequency Provider Last Rate Last Admin  . 0.9 %  sodium chloride infusion   Intravenous PRN Adele Barthel D, MD 10 mL/hr at 11/17/19 0842 1,000 mL at 11/17/19 0842  . morphine 2 MG/ML injection 1 mg  1 mg Intravenous Q3H PRN Lenore Cordia, MD   1 mg at 11/17/19 8546  . ondansetron (ZOFRAN) tablet 4 mg  4 mg Oral Q6H PRN Lenore Cordia, MD       Or  . ondansetron (ZOFRAN) injection 4 mg  4 mg Intravenous Q6H PRN Zada Finders R, MD      . oxyCODONE (Oxy IR/ROXICODONE) immediate release tablet 10 mg  10 mg Oral Q4H PRN Adele Barthel D, MD   10 mg at 11/17/19 0850  . piperacillin-tazobactam (ZOSYN) IVPB 3.375 g  3.375 g Intravenous Q8H Duanne Limerick, RPH 12.5 mL/hr at 11/17/19 0943 3.375 g at 11/17/19 0943  . senna-docusate (Senokot-S) tablet 1 tablet  1 tablet Oral QHS PRN Zada Finders R, MD      . vancomycin (VANCOCIN) 1,500 mg in sodium chloride 0.9 % 500 mL IVPB  1,500 mg Intravenous Q12H Duanne Limerick, RPH 250 mL/hr at 11/17/19 0852 1,500 mg at 11/17/19 2703    Allergies as of 11/16/2019 - Review Complete 11/16/2019  Allergen Reaction Noted  . Metaxalone Swelling 06/06/2016    Family History   Problem Relation Age of Onset  . Arthritis Mother   . Hypertension Father   . Heart disease Father   . Diabetes Mellitus II Sister     Social History   Socioeconomic History  . Marital status: Single    Spouse name: Not on file  . Number of children: Not on file  . Years of education: Not on file  . Highest education level: Not on file  Occupational History  . Not on file  Tobacco Use  . Smoking status: Current Every Day  Smoker    Packs/day: 0.50  . Smokeless tobacco: Never Used  Substance and Sexual Activity  . Alcohol use: Not Currently    Comment: occ  . Drug use: No  . Sexual activity: Not on file  Other Topics Concern  . Not on file  Social History Narrative  . Not on file   Social Determinants of Health   Financial Resource Strain:   . Difficulty of Paying Living Expenses: Not on file  Food Insecurity:   . Worried About Charity fundraiser in the Last Year: Not on file  . Ran Out of Food in the Last Year: Not on file  Transportation Needs:   . Lack of Transportation (Medical): Not on file  . Lack of Transportation (Non-Medical): Not on file  Physical Activity:   . Days of Exercise per Week: Not on file  . Minutes of Exercise per Session: Not on file  Stress:   . Feeling of Stress : Not on file  Social Connections:   . Frequency of Communication with Friends and Family: Not on file  . Frequency of Social Gatherings with Friends and Family: Not on file  . Attends Religious Services: Not on file  . Active Member of Clubs or Organizations: Not on file  . Attends Archivist Meetings: Not on file  . Marital Status: Not on file  Intimate Partner Violence:   . Fear of Current or Ex-Partner: Not on file  . Emotionally Abused: Not on file  . Physically Abused: Not on file  . Sexually Abused: Not on file    Review of Systems: All systems reviewed and negative except where noted in HPI.  Physical Exam: Vital signs in last 24 hours: Temp:  [97.7  F (36.5 C)-98.5 F (36.9 C)] 98.2 F (36.8 C) (12/12 0605) Pulse Rate:  [62-74] 62 (12/12 0605) Resp:  [16-20] 17 (12/12 0605) BP: (113-147)/(69-107) 113/69 (12/12 0605) SpO2:  [93 %-100 %] 96 % (12/12 0605) Weight:  [113.4 kg] 113.4 kg (12/11 1200) Last BM Date: 11/13/19 General:   Alert, well-developed,  male in NAD Psych:  Pleasant, cooperative. Normal mood and affect. Eyes:  Pupils equal, sclera clear, no icterus.   Conjunctiva pink. Ears:  Normal auditory acuity. Nose:  No deformity, discharge,  or lesions. Neck:  Supple; no masses Lungs:  Clear throughout to auscultation.   No wheezes, crackles, or rhonchi.  Heart:  Regular rate and rhythm;  no lower extremity edema Abdomen:  Soft, non-distended, moderate RUQ tenderness, BS active, no palp mass   Rectal:  Deferred  Msk:  Symmetrical without gross deformities. . Neurologic:  Alert and  oriented x4;  grossly normal neurologically. Skin:  Large scabbed area to LUE.   Intake/Output from previous day: 12/11 0701 - 12/12 0700 In: 246.8 [I.V.:233.1; IV Piggyback:13.8] Out: -  Intake/Output this shift: Total I/O In: 240 [P.O.:240] Out: 750 [Urine:750]  Lab Results: Recent Labs    11/16/19 1300 11/17/19 0616  WBC 3.2* 2.8*  HGB 15.6 14.4  HCT 48.1 44.3  PLT 131* 188   BMET Recent Labs    11/16/19 1300 11/17/19 0616  NA 135 137  K 3.8 3.7  CL 101 104  CO2 20* 24  GLUCOSE 89 126*  BUN 10 7  CREATININE 0.69 0.83  CALCIUM 8.8* 8.4*   LFT Recent Labs    11/17/19 0616  PROT 6.6  ALBUMIN 3.0*  AST 1,680*  ALT 1,486*  ALKPHOS 107  BILITOT 4.0*  BILIDIR 2.5*  IBILI 1.5*   PT/INR Recent Labs    11/16/19 1551  LABPROT 14.7  INR 1.2   Hepatitis Panel Recent Labs    11/16/19 1432  HEPBSAG NON REACTIVE  HCVAB Reactive*  HEPAIGM Reactive*  HEPBIGM NON REACTIVE     . CBC Latest Ref Rng & Units 11/17/2019 11/16/2019 04/12/2019  WBC 4.0 - 10.5 K/uL 2.8(L) 3.2(L) 5.4  Hemoglobin 13.0 - 17.0 g/dL  14.4 15.6 15.1  Hematocrit 39.0 - 52.0 % 44.3 48.1 46.0  Platelets 150 - 400 K/uL 188 131(L) 214    . CMP Latest Ref Rng & Units 11/17/2019 11/16/2019 04/12/2019  Glucose 70 - 99 mg/dL 126(H) 89 77  BUN 6 - 20 mg/dL '7 10 12  '$ Creatinine 0.61 - 1.24 mg/dL 0.83 0.69 0.86  Sodium 135 - 145 mmol/L 137 135 140  Potassium 3.5 - 5.1 mmol/L 3.7 3.8 3.9  Chloride 98 - 111 mmol/L 104 101 104  CO2 22 - 32 mmol/L 24 20(L) 22  Calcium 8.9 - 10.3 mg/dL 8.4(L) 8.8(L) 9.4  Total Protein 6.5 - 8.1 g/dL 6.6 7.3 7.2  Total Bilirubin 0.3 - 1.2 mg/dL 4.0(H) 3.9(H) 0.6  Alkaline Phos 38 - 126 U/L 107 132(H) 64  AST 15 - 41 U/L 1,680(H) 2,300(H) 20  ALT 0 - 44 U/L 1,486(H) 1,756(H) 15   Studies/Results: DG Chest 2 View  Result Date: 11/16/2019 CLINICAL DATA:  [place] 1 week ago.  Pain.  Smoker. EXAM: CHEST - 2 VIEW COMPARISON:  05/19/2017. FINDINGS: Cardiac silhouette is normal in size. No mediastinal or hilar masses or evidence of adenopathy. Clear lungs.  No pleural effusion or pneumothorax. Skeletal structures are intact. IMPRESSION: No active cardiopulmonary disease. Electronically Signed   By: Lajean Manes M.D.   On: 11/16/2019 12:50   DG Lumbar Spine Complete  Result Date: 11/16/2019 CLINICAL DATA:  [place] 1 week ago.  Pain. EXAM: LUMBAR SPINE - COMPLETE 4+ VIEW COMPARISON:  06/27/2017. FINDINGS: No fracture, bone lesion or spondylolisthesis. Mild loss of disc height at L4-L5 and L5-S1. There are facet degenerative changes which are most prominent in the mid to lower lumbar spine bilaterally. Endplate osteophytes extend from the lower thoracic spine to S1. Aortic calcifications are noted. Soft tissues are otherwise unremarkable. Findings are similar to the previous lumbar spine radiographs. IMPRESSION: 1. No fracture or acute finding.  No spondylolisthesis. 2. Degenerative changes as described. Electronically Signed   By: Lajean Manes M.D.   On: 11/16/2019 12:52   DG Forearm Left  Result Date:  11/16/2019 CLINICAL DATA:  Fall 1 week ago.  Pain. EXAM: LEFT FOREARM - 2 VIEW COMPARISON:  None. FINDINGS: No fracture or bone lesion. Wrist and elbow joints are normally aligned. There is soft tissue swelling most focal in prominent along the radial aspect of the distal forearm, approximately 6 cm proximal to the wrist joint. IMPRESSION: 1. No fracture or dislocation. 2. Soft tissue swelling as detailed. Electronically Signed   By: Lajean Manes M.D.   On: 11/16/2019 12:48   CT ABDOMEN PELVIS W CONTRAST  Result Date: 11/16/2019 CLINICAL DATA:  Acute generalized abdominal pain.  Fall last week. EXAM: CT ABDOMEN AND PELVIS WITH CONTRAST TECHNIQUE: Multidetector CT imaging of the abdomen and pelvis was performed using the standard protocol following bolus administration of intravenous contrast. CONTRAST:  128m OMNIPAQUE IOHEXOL 300 MG/ML  SOLN COMPARISON:  08/05/2016 FINDINGS: Lower chest: Clear lung bases. Hepatobiliary: There is gallbladder wall thickening with hazy infiltration in the pericholecystic fat. No visualized gallstone. Gallbladder  is mildly distended. No bile duct dilation. 4 mm low-attenuation lesion in the right lobe, segment 6, likely a cyst or hemangioma, but too small to characterized. This is not evident on the prior CT. No other liver masses or lesions. Liver normal in size. No liver contusion or laceration. Pancreas: Unremarkable. No pancreatic ductal dilatation or surrounding inflammatory changes. Spleen: Normal in size without focal abnormality. Adrenals/Urinary Tract: No adrenal masses. Kidneys normal size, orientation and position with symmetric enhancement and excretion. 7 mm posterior upper pole right renal cyst. No other renal masses, no stones and no hydronephrosis. Ureters are normal in course and in caliber. Bladder wall appears thickened accentuated by lack of distension. No bladder mass or stone. Stomach/Bowel: Stomach is within normal limits. Appendix appears normal. No  evidence of bowel wall thickening, distention, or inflammatory changes. Vascular/Lymphatic: Prominent gastrohepatic ligament and periceliac lymph nodes, largest 12 mm in short axis. These appear increased from the prior CT. No other adenopathy. Aortic atherosclerosis. No aneurysm. Reproductive: Unremarkable. Other: No abdominal wall hernia or abnormality. No abdominopelvic ascites. Musculoskeletal: No fracture or acute finding. No osteoblastic or osteolytic lesions. IMPRESSION: 1. Gallbladder wall thickening with hazy adjacent inflammation. Findings support acute cholecystitis, although no gallstone is seen. Recommend follow-up limited right upper quadrant ultrasound for further assessment. 2. Prominent to mildly enlarged Peri celiac and gastrohepatic ligament lymph nodes. These are most likely reactive. 3. Bladder wall appears thickened which is similar to the prior CT. Consider cystitis if there are consistent clinical findings. 4. No other evidence of an acute abnormality. 5. Aortic atherosclerosis. Electronically Signed   By: Lajean Manes M.D.   On: 11/16/2019 16:35   US Abdomen Limited RUQ  Result Date: 11/16/2019 CLINICAL DATA:  Cholecystitis. Abdominal pain. Abnormal CT scan of the abdomen dated 11/16/2019 EXAM: ULTRASOUND ABDOMEN LIMITED RIGHT UPPER QUADRANT COMPARISON:  CT scan dated 11/16/2019 FINDINGS: Gallbladder: The gallbladder is contracted with a thick irregular wall. No discrete stones. Positive sonographic Murphy's sign. Small amount of pericholecystic fluid. Common bile duct: Diameter: 2 mm, normal. Liver: No focal lesion identified. Within normal limits in parenchymal echogenicity. Portal vein is patent on color Doppler imaging with normal direction of blood flow towards the liver. Other: None. IMPRESSION: Findings are consistent with acalculous cholecystitis. Electronically Signed   By: Lorriane Shire M.D.   On: 11/16/2019 19:42    Principal Problem:   Transaminitis Active Problems:    Essential hypertension   Abscess of left upper extremity   Acalculous cholecystitis   Acute hepatitis A virus infection    Tye Savoy, NP-C @  11/17/2019, 10:36 AM

## 2019-11-17 NOTE — Progress Notes (Signed)
PROGRESS NOTE    Ronnie Moore  EOF:[id number] DOB: [birth year] DOA: 11/16/2019 PCP: Patient, No Pcp Per  Brief Narrative: Ronnie Moore is a 58 y.o. 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 He presented to the ED with nausea, vomiting, left upper arm abscess. He underwent I&D of the abscess, was started on IV vancomycin. Was found to have elevated LFTs. CT abd/pelvis with suspicion for acute cholecystitis without evidence of gallstone.  Prominent mildly enlarged periceliac gastrohepatic ligament lymph nodes are noted.   RUQ ultrasound showed changes consistent with a calculus cholecystitis, CBD diameter 2 mm. Patient started on IV Zosyn.   Positive for acute hepatitis A. Surgery consulted, felt like CT and US findings were secondary to hepatitis. GI consulted and following.   Assessment & Plan:   Principal Problem:   Transaminitis Active Problems:   Essential hypertension   Abscess of left upper extremity   Acalculous cholecystitis   Acute hepatitis A virus infection   Transaminitis in setting of acute hepatitis A viral infection: Hepatitis A IgM is reactive.  Acute hepatitis is likely cause of his transaminitis.  Serum acetaminophen level is undetectable.  Will continue supportive care. -Continue IV fluid hydration -Hold Tylenol -Hep C pending--patient reports being treated for it in the past -GI consulted, appreciate further recommendations -I contacted infection control and requested that they report this case of hepatitis A to the Health Dept.   Acalculous cholecystitis: Without evidence of gallstones on CT or ultrasound imaging.  CBD diameter is 2 mm.  He has continued RUQ abdominal pain.  Lipase is elevated without pancreatic ductal dilatation or inflammation on CT imaging. -Continue IV fluid  resuscitation as above -Continue pain control and antiemetics as needed -General surgery consulted, recommend continue medical management -Will continue IV Zosyn for now -Patient's diet advanced but he has nausea, will continue monitoring.   Abscess of left upper extremity: S/p I&D by EDP 11/16/2019.  Continue empiric IV vancomycin for now.  Hypertension: Currently stable, home medication reconciliation pending.  Chronic paraplegia secondary to spinal cord stroke: Chronic and unchanged from baseline.  DVT prophylaxis: SCDs Code Status: Full code, confirmed with patient Family Communication: Discussed with patient, he has discussed with family Disposition Plan: Pending clinical progress Consults called: GI, general surgery Admission status: Inpatient, anticipate patient requires greater than 2 midnight length stay for management of acute hepatitis A, acalculous cholecystitis, and left upper extremity abscess.  Procedures:  I&D of left upper arm on 12/11  Antimicrobials:  Vancomycin 12/11>> Zosyn 12/11>>   Subjective: Patient with nausea and epigastric pain. Denies diarrhea. His left arm has minimum pain.   Objective: Vitals:   11/16/19 2145 11/16/19 2303 11/17/19 0605 11/17/19 1348  BP:  131/89 113/69 137/83  Pulse:  69 62 69  Resp:  _0 Temp: 98.5 F (36.9 C) 97.7 F (36.5 C) 98.2 F (36.8 C) 98.9 F (37.2 C)  TempSrc: Oral Oral Oral Oral  SpO2:  95% 96% 96%  Weight:      Height:        Intake/Output Summary (Last 24 hours) at 11/17/2019 1634 Last data filed at 11/17/2019 1400 Gross per 24 hour  Intake 1884.68 ml  Output 1352 ml  Net 532.68 ml   Filed Weights   11/16/19 1200  Weight: 113.4 kg    Examination:  General exam:  Appears calm and comfortable  Respiratory system: No respiratory distress.  Cardiovascular system: RRR. Gastrointestinal system: Abdomen is nondistended, soft, with RUQ and epigastric tenderness with no rebound  tenderness.  Central nervous system: Alert and oriented x3.  Extremities: Upper extremity with nl strength, strength 1/5 bl lower extremities.  Skin: Several healing scabs in upper arms. Left arm with bandage that is c/d/i. Psychiatry: Guarded affect.     Data Reviewed: I have personally reviewed following labs and imaging studies  CBC: Recent Labs  Lab 11/16/19 1300 11/17/19 0616  WBC 3.2* 2.8*  NEUTROABS 1.3*  --   HGB 15.6 14.4  HCT 48.1 44.3  MCV 91.4 90.6  PLT 131* 009   Basic Metabolic Panel: Recent Labs  Lab 11/16/19 1300 11/17/19 0616  NA 135 137  K 3.8 3.7  CL 101 104  CO2 20* 24  GLUCOSE 89 126*  BUN 10 7  CREATININE 0.69 0.83  CALCIUM 8.8* 8.4*   GFR: Estimated Creatinine Clearance: 121.4 mL/min (by C-G formula based on SCr of 0.83 mg/dL). Liver Function Tests: Recent Labs  Lab 11/16/19 1300 11/17/19 0616  AST 2,300* 1,680*  ALT 1,756* 1,486*  ALKPHOS 132* 107  BILITOT 3.9* 4.0*  PROT 7.3 6.6  ALBUMIN 3.5 3.0*   Recent Labs  Lab 11/16/19 1432  LIPASE 198*   No results for input(s): AMMONIA in the last 168 hours. Coagulation Profile: Recent Labs  Lab 11/16/19 1551  INR 1.2   Cardiac Enzymes: No results for input(s): CKTOTAL, CKMB, CKMBINDEX, TROPONINI in the last 168 hours. BNP (last 3 results) No results for input(s): PROBNP in the last 8760 hours. HbA1C: No results for input(s): HGBA1C in the last 72 hours. CBG: No results for input(s): GLUCAP in the last 168 hours. Lipid Profile: No results for input(s): CHOL, HDL, LDLCALC, TRIG, CHOLHDL, LDLDIRECT in the last 72 hours. Thyroid Function Tests: No results for input(s): TSH, T4TOTAL, FREET4, T3FREE, THYROIDAB in the last 72 hours. Anemia Panel: No results for input(s): VITAMINB12, FOLATE, FERRITIN, TIBC, IRON, RETICCTPCT in the last 72 hours. Sepsis Labs: No results for input(s): PROCALCITON, LATICACIDVEN in the last 168 hours.  Recent Results (from the past 240 hour(s))  SARS  CORONAVIRUS 2 (TAT 6-24 HRS) Nasopharyngeal Nasopharyngeal Swab     Status: None   Collection Time: 11/16/19  1:45 PM   Specimen: Nasopharyngeal Swab  Result Value Ref Range Status   SARS Coronavirus 2 NEGATIVE NEGATIVE Final    Comment: (NOTE) SARS-CoV-2 target nucleic acids are NOT DETECTED. The SARS-CoV-2 RNA is generally detectable in upper and lower respiratory specimens during the acute phase of infection. Negative results do not preclude SARS-CoV-2 infection, do not rule out co-infections with other pathogens, and should not be used as the sole basis for treatment or other patient management decisions. Negative results must be combined with clinical observations, patient history, and epidemiological information. The expected result is Negative. Fact Sheet for Patients: SugarRoll.be Fact Sheet for Healthcare Providers: https://www.woods-mathews.com/ This test is not yet approved or cleared by the [place] FDA and  has been authorized for detection and/or diagnosis of SARS-CoV-2 by FDA under an Emergency Use Authorization (EUA). This EUA will remain  in effect (meaning this test can be used) for the duration of the COVID-19 declaration under Section 56 4(b)(1) of the Act, 21 U.S.C. section 360bbb-3(b)(1), unless the authorization is terminated or revoked sooner. Performed at [facility], [place] [address], [place], [place] [address]   SARS Coronavirus 2 Ag (30  min TAT) - Nasal Swab (BD Veritor Kit)     Status: None   Collection Time: 11/16/19  3:51 PM   Specimen: Nasal Swab (BD Veritor Kit)  Result Value Ref Range Status   SARS Coronavirus 2 Ag NEGATIVE NEGATIVE Final    Comment: (NOTE) SARS-CoV-2 antigen NOT DETECTED.  Negative results are presumptive.  Negative results do not preclude SARS-CoV-2 infection and should not be used as the sole basis for treatment or other patient management decisions, including infection    control decisions, particularly in the presence of clinical signs and  symptoms consistent with COVID-19, or in those who have been in contact with the virus.  Negative results must be combined with clinical observations, patient history, and epidemiological information. The expected result is Negative. Fact Sheet for Patients: PodPark.tn Fact Sheet for Healthcare Providers: GiftContent.is This test is not yet approved or cleared by the [place] FDA and  has been authorized for detection and/or diagnosis of SARS-CoV-2 by FDA under an Emergency Use Authorization (EUA).  This EUA will remain in effect (meaning this test can be used) for the duration of  the COVID-19 de claration under Section 564(b)(1) of the Act, 21 U.S.C. section 360bbb-3(b)(1), unless the authorization is terminated or revoked sooner. Performed at [facility], [address] [place], [place] [address]   Culture, blood (routine x 2)     Status: None (Preliminary result)   Collection Time: 11/16/19  8:15 PM   Specimen: Right Antecubital; Blood  Result Value Ref Range Status   Specimen Description   Final    RIGHT ANTECUBITAL Performed at [facility], [place], [address] [place], [place] [address]    Special Requests   Final    BOTTLES DRAWN AEROBIC AND ANAEROBIC Blood Culture adequate volume Performed at [facility], [address] [place], [place] [address]    Culture   Final    NO GROWTH < 12 HOURS Performed at [facility], [place] [address], [place], [place] [address]    Report Status PENDING  Incomplete  Culture, blood (routine x 2)     Status: None (Preliminary result)   Collection Time: 11/16/19  8:20 PM   Specimen: BLOOD RIGHT HAND  Result Value Ref Range Status   Specimen Description   Final    BLOOD RIGHT HAND Performed at [facility], [address] [place], [place] [address]     Special Requests   Final    BOTTLES DRAWN AEROBIC AND ANAEROBIC Blood Culture results may not be optimal due to an inadequate volume of blood received in culture bottles Performed at [facility], [address] [place], [place] [address]    Culture   Final    NO GROWTH < 12 HOURS Performed at [facility], [place] [address] [place], [place] [address]    Report Status PENDING  Incomplete  MRSA PCR Screening     Status: None   Collection Time: 11/17/19  1:39 AM   Specimen: Nasopharyngeal  Result Value Ref Range Status   MRSA by PCR NEGATIVE NEGATIVE Final    Comment:        The GeneXpert MRSA Assay (FDA approved for NASAL specimens only), is one component of a comprehensive MRSA colonization surveillance program. It is not intended to diagnose MRSA infection nor to guide or monitor treatment for MRSA infections. Performed at [facility], [address] [place], [place] [address]          Radiology Studies: DG Chest 2 View  Result Date: 11/16/2019 CLINICAL DATA:  [place] 1 week ago.  Pain.  Smoker. EXAM: CHEST - 2 VIEW COMPARISON:  05/19/2017. FINDINGS: Cardiac silhouette is normal in size. No mediastinal or hilar masses or evidence of adenopathy. Clear lungs.  No pleural effusion or pneumothorax. Skeletal structures are intact. IMPRESSION: No active cardiopulmonary disease. Electronically Signed   By: Lajean Manes M.D.   On: 11/16/2019 12:50   DG Lumbar Spine Complete  Result Date: 11/16/2019 CLINICAL DATA:  [place] 1 week ago.  Pain. EXAM: LUMBAR SPINE - COMPLETE 4+ VIEW COMPARISON:  06/27/2017. FINDINGS: No fracture, bone lesion or spondylolisthesis. Mild loss of disc height at L4-L5 and L5-S1. There are facet degenerative changes which are most prominent in the mid to lower lumbar spine bilaterally. Endplate osteophytes extend from the lower thoracic spine to S1. Aortic calcifications are noted. Soft tissues are otherwise unremarkable.  Findings are similar to the previous lumbar spine radiographs. IMPRESSION: 1. No fracture or acute finding.  No spondylolisthesis. 2. Degenerative changes as described. Electronically Signed   By: Lajean Manes M.D.   On: 11/16/2019 12:52   DG Forearm Left  Result Date: 11/16/2019 CLINICAL DATA:  [place] 1 week ago.  Pain. EXAM: LEFT FOREARM - 2 VIEW COMPARISON:  None. FINDINGS: No fracture or bone lesion. Wrist and elbow joints are normally aligned. There is soft tissue swelling most focal in prominent along the radial aspect of the distal forearm, approximately 6 cm proximal to the wrist joint. IMPRESSION: 1. No fracture or dislocation. 2. Soft tissue swelling as detailed. Electronically Signed   By: Lajean Manes M.D.   On: 11/16/2019 12:48   CT ABDOMEN PELVIS W CONTRAST  Result Date: 11/16/2019 CLINICAL DATA:  Acute generalized abdominal pain.  Fall last week. EXAM: CT ABDOMEN AND PELVIS WITH CONTRAST TECHNIQUE: Multidetector CT imaging of the abdomen and pelvis was performed using the standard protocol following bolus administration of intravenous contrast. CONTRAST:  163m OMNIPAQUE IOHEXOL 300 MG/ML  SOLN COMPARISON:  08/05/2016 FINDINGS: Lower chest: Clear lung bases. Hepatobiliary: There is gallbladder wall thickening with hazy infiltration in the pericholecystic fat. No visualized gallstone. Gallbladder is mildly distended. No bile duct dilation. 4 mm low-attenuation lesion in the right lobe, segment 6, likely a cyst or hemangioma, but too small to characterized. This is not evident on the prior CT. No other liver masses or lesions. Liver normal in size. No liver contusion or laceration. Pancreas: Unremarkable. No pancreatic ductal dilatation or surrounding inflammatory changes. Spleen: Normal in size without focal abnormality. Adrenals/Urinary Tract: No adrenal masses. Kidneys normal size, orientation and position with symmetric enhancement and excretion. 7 mm posterior upper pole right renal cyst.  No other renal masses, no stones and no hydronephrosis. Ureters are normal in course and in caliber. Bladder wall appears thickened accentuated by lack of distension. No bladder mass or stone. Stomach/Bowel: Stomach is within normal limits. Appendix appears normal. No evidence of bowel wall thickening, distention, or inflammatory changes. Vascular/Lymphatic: Prominent gastrohepatic ligament and periceliac lymph nodes, largest 12 mm in short axis. These appear increased from the prior CT. No other adenopathy. Aortic atherosclerosis. No aneurysm. Reproductive: Unremarkable. Other: No abdominal wall hernia or abnormality. No abdominopelvic ascites. Musculoskeletal: No fracture or acute finding. No osteoblastic or osteolytic lesions. IMPRESSION: 1. Gallbladder wall thickening with hazy adjacent inflammation. Findings support acute cholecystitis, although no gallstone is seen. Recommend follow-up limited right  upper quadrant ultrasound for further assessment. 2. Prominent to mildly enlarged Peri celiac and gastrohepatic ligament lymph nodes. These are most likely reactive. 3. Bladder wall appears thickened which is similar to the prior CT. Consider cystitis if there are consistent clinical findings. 4. No other evidence of an acute abnormality. 5. Aortic atherosclerosis. Electronically Signed   By: Lajean Manes M.D.   On: 11/16/2019 16:35   US Abdomen Limited RUQ  Result Date: 11/16/2019 CLINICAL DATA:  Cholecystitis. Abdominal pain. Abnormal CT scan of the abdomen dated 11/16/2019 EXAM: ULTRASOUND ABDOMEN LIMITED RIGHT UPPER QUADRANT COMPARISON:  CT scan dated 11/16/2019 FINDINGS: Gallbladder: The gallbladder is contracted with a thick irregular wall. No discrete stones. Positive sonographic Murphy's sign. Small amount of pericholecystic fluid. Common bile duct: Diameter: 2 mm, normal. Liver: No focal lesion identified. Within normal limits in parenchymal echogenicity. Portal vein is patent on color Doppler  imaging with normal direction of blood flow towards the liver. Other: None. IMPRESSION: Findings are consistent with acalculous cholecystitis. Electronically Signed   By: Lorriane Shire M.D.   On: 11/16/2019 19:42        Scheduled Meds: . loratadine  10 mg Oral Daily   Continuous Infusions: . sodium chloride 1,000 mL (11/17/19 0842)  . sodium chloride 150 mL/hr at 11/17/19 1544  . piperacillin-tazobactam (ZOSYN)  IV 3.375 g (11/17/19 0943)  . vancomycin 250 mL/hr at 11/17/19 1000     LOS: 1 day    Time spent: 35 minutes   Blain Pais, MD [facility]   If 7PM-7AM, please contact night-coverage [email or web address] Password [facility] 11/17/2019, 4:34 PM

## 2019-11-17 NOTE — Consult Note (Signed)
Reason for Consult: right abdominal pain Referring Physician: [name] is an 58 y.o. male.  HPI: 58 yo male with history of spinal infarct who is wheelchair bound, feel 1 week ago. In the last 3 days he began having increasing right upper quadrant pain and noted his urine turned to a brown color. Pain has continued to get more intense and so he called EMS. Pain is constant. He has a very distant history of drug use which is mostly marijuana but does not rare IV drug use. He has been told he had hepatitis in the past and was treated a "long time ago".  Past Medical History:  Diagnosis Date  . Cancer ([place])   . Chronic back pain   . Hypertension   . Stroke [facility])     Past Surgical History:  Procedure Laterality Date  . BACK SURGERY    . I&D EXTREMITY Right 06/07/2016   Procedure: IRRIGATION AND DEBRIDEMENT WRIST;  Surgeon: Ronnie Barker, MD;  Location: [facility];  Service: Plastics;  Laterality: Right;  . KNEE SURGERY      Family History  Problem Relation Age of Onset  . Arthritis Mother   . Hypertension Father   . Heart disease Father   . Diabetes Mellitus II Sister     Social History:  reports that he has been smoking. He has been smoking about 0.50 packs per day. He has never used smokeless tobacco. He reports previous alcohol use. He reports that he does not use drugs.  Allergies:  Allergies  Allergen Reactions  . Metaxalone Swelling    Reaction to Skelaxin - lips swelled    Medications: I have reviewed the patient's current medications.  Results for orders placed or performed during the hospital encounter of 11/16/19 (from the past 48 hour(s))  Comprehensive metabolic panel     Status: Abnormal   Collection Time: 11/16/19  1:00 PM  Result Value Ref Range   Sodium 135 135 - 145 mmol/L   Potassium 3.8 3.5 - 5.1 mmol/L   Chloride 101 98 - 111 mmol/L   CO2 20 (L) 22 - 32 mmol/L   Glucose, Bld 89 70 - 99 mg/dL   BUN 10 6 - 20 mg/dL   Creatinine, Ser  0.69 0.61 - 1.24 mg/dL   Calcium 8.8 (L) 8.9 - 10.3 mg/dL   Total Protein 7.3 6.5 - 8.1 g/dL   Albumin 3.5 3.5 - 5.0 g/dL   AST 2,300 (H) 15 - 41 U/L    Comment: RESULTS CONFIRMED BY MANUAL DILUTION   ALT 1,756 (H) 0 - 44 U/L   Alkaline Phosphatase 132 (H) 38 - 126 U/L   Total Bilirubin 3.9 (H) 0.3 - 1.2 mg/dL   GFR calc non Af Amer >60 >60 mL/min   GFR calc Af Amer >60 >60 mL/min   Anion gap 14 5 - 15    Comment: Performed at [facility], [address] [place], [place] [address]  CBC with Differential     Status: Abnormal   Collection Time: 11/16/19  1:00 PM  Result Value Ref Range   WBC 3.2 (L) 4.0 - 10.5 K/uL   RBC 5.26 4.22 - 5.81 MIL/uL   Hemoglobin 15.6 13.0 - 17.0 g/dL   HCT 48.1 39.0 - 52.0 %   MCV 91.4 80.0 - 100.0 fL   MCH 29.7 26.0 - 34.0 pg   MCHC 32.4 30.0 - 36.0 g/dL   RDW 13.8 11.5 - 15.5 %  Platelets 131 (L) 150 - 400 K/uL    Comment: REPEATED TO VERIFY SPECIMEN CHECKED FOR CLOTS    nRBC 0.0 0.0 - 0.2 %   Neutrophils Relative % 40 %   Neutro Abs 1.3 (L) 1.7 - 7.7 K/uL   Lymphocytes Relative 48 %   Lymphs Abs 1.5 0.7 - 4.0 K/uL   Monocytes Relative 10 %   Monocytes Absolute 0.3 0.1 - 1.0 K/uL   Eosinophils Relative 1 %   Eosinophils Absolute 0.0 0.0 - 0.5 K/uL   Basophils Relative 1 %   Basophils Absolute 0.0 0.0 - 0.1 K/uL   Immature Granulocytes 0 %   Abs Immature Granulocytes 0.01 0.00 - 0.07 K/uL    Comment: Performed at [facility], [address] [place], [place] [address]  SARS CORONAVIRUS 2 (TAT 6-24 HRS) Nasopharyngeal Nasopharyngeal Swab     Status: None   Collection Time: 11/16/19  1:45 PM   Specimen: Nasopharyngeal Swab  Result Value Ref Range   SARS Coronavirus 2 NEGATIVE NEGATIVE    Comment: (NOTE) SARS-CoV-2 target nucleic acids are NOT DETECTED. The SARS-CoV-2 RNA is generally detectable in upper and lower respiratory specimens during the acute phase of infection. Negative results do not preclude SARS-CoV-2  infection, do not rule out co-infections with other pathogens, and should not be used as the sole basis for treatment or other patient management decisions. Negative results must be combined with clinical observations, patient history, and epidemiological information. The expected result is Negative. Fact Sheet for Patients: SugarRoll.be Fact Sheet for Healthcare Providers: https://www.woods-mathews.com/ This test is not yet approved or cleared by the [place] FDA and  has been authorized for detection and/or diagnosis of SARS-CoV-2 by FDA under an Emergency Use Authorization (EUA). This EUA will remain  in effect (meaning this test can be used) for the duration of the COVID-19 declaration under Section 56 4(b)(1) of the Act, 21 U.S.C. section 360bbb-3(b)(1), unless the authorization is terminated or revoked sooner. Performed at [facility], [place] [address] [place], [place] [address]   Lipase, blood     Status: Abnormal   Collection Time: 11/16/19  2:32 PM  Result Value Ref Range   Lipase 198 (H) 11 - 51 U/L    Comment: Performed at [facility], [address] [place], [place] [address]  Hepatitis panel, acute     Status: Abnormal   Collection Time: 11/16/19  2:32 PM  Result Value Ref Range   Hepatitis B Surface Ag NON REACTIVE NON REACTIVE   HCV Ab Reactive (A) NON REACTIVE    Comment: (NOTE) The CDC recommends that a Reactive HCV antibody result be followed up  with a HCV Nucleic Acid Amplification test.    Hep A IgM Reactive (A) NON REACTIVE   Hep B C IgM NON REACTIVE NON REACTIVE    Comment: Performed at [facility], [place] [address], [place],  [address]  Urinalysis, Routine w reflex microscopic     Status: Abnormal   Collection Time: 11/16/19  3:20 PM  Result Value Ref Range   Color, Urine AMBER (A) YELLOW    Comment: BIOCHEMICALS MAY BE AFFECTED BY COLOR   APPearance CLEAR CLEAR   Specific  Gravity, Urine 1.020 1.005 - 1.030   pH 6.0 5.0 - 8.0   Glucose, UA NEGATIVE NEGATIVE mg/dL   Hgb urine dipstick NEGATIVE NEGATIVE   Bilirubin Urine MODERATE (A) NEGATIVE   Ketones, ur 15 (A) NEGATIVE mg/dL  Protein, ur NEGATIVE NEGATIVE mg/dL   Nitrite NEGATIVE NEGATIVE   Leukocytes,Ua NEGATIVE NEGATIVE    Comment: Microscopic not done on urines with negative protein, blood, leukocytes, nitrite, or glucose < 500 mg/dL. Performed at [facility], [address] [place], [place] [address]   Acetaminophen level     Status: Abnormal   Collection Time: 11/16/19  3:51 PM  Result Value Ref Range   Acetaminophen (Tylenol), Serum <10 (L) 10 - 30 ug/mL    Comment: Performed at [facility], [address] [place], [place] [address]  SARS Coronavirus 2 Ag (30 min TAT) - Nasal Swab (BD Veritor Kit)     Status: None   Collection Time: 11/16/19  3:51 PM   Specimen: Nasal Swab (BD Veritor Kit)  Result Value Ref Range   SARS Coronavirus 2 Ag NEGATIVE NEGATIVE    Comment: (NOTE) SARS-CoV-2 antigen NOT DETECTED.  Negative results are presumptive.  Negative results do not preclude SARS-CoV-2 infection and should not be used as the sole basis for treatment or other patient management decisions, including infection  control decisions, particularly in the presence of clinical signs and  symptoms consistent with COVID-19, or in those who have been in contact with the virus.  Negative results must be combined with clinical observations, patient history, and epidemiological information. The expected result is Negative. Fact Sheet for Patients: PodPark.tn Fact Sheet for Healthcare Providers: GiftContent.is This test is not yet approved or cleared by the [place] FDA and  has been authorized for detection and/or diagnosis of SARS-CoV-2 by FDA under an Emergency Use Authorization (EUA).  This EUA will remain in effect  (meaning this test can be used) for the duration of  the COVID-19 de claration under Section 564(b)(1) of the Act, 21 U.S.C. section 360bbb-3(b)(1), unless the authorization is terminated or revoked sooner. Performed at [facility], [address] [place], [place] [address]   Ethanol     Status: None   Collection Time: 11/16/19  3:51 PM  Result Value Ref Range   Alcohol, Ethyl (B) <10 <10 mg/dL    Comment: (NOTE) Lowest detectable limit for serum alcohol is 10 mg/dL. For medical purposes only. Performed at [facility], [address] [place], [place] [address]   Protime-INR     Status: None   Collection Time: 11/16/19  3:51 PM  Result Value Ref Range   Prothrombin Time 14.7 11.4 - 15.2 seconds   INR 1.2 0.8 - 1.2    Comment: (NOTE) INR goal varies based on device and disease states. Performed at [facility], [address] [place], [place] [address]   Culture, blood (routine x 2)     Status: None (Preliminary result)   Collection Time: 11/16/19  8:15 PM   Specimen: Right Antecubital; Blood  Result Value Ref Range   Specimen Description      RIGHT ANTECUBITAL Performed at [facility], [address] [place], [place] [address]    Special Requests      BOTTLES DRAWN AEROBIC AND ANAEROBIC Blood Culture adequate volume Performed at [facility], [address] [place], [place] [address]    Culture      NO GROWTH < 12 HOURS Performed at [facility], [place] [address], [place], [place] [address]    Report Status PENDING   Culture, blood (routine x 2)     Status: None (  Preliminary result)   Collection Time: 11/16/19  8:20 PM   Specimen: BLOOD RIGHT HAND  Result Value Ref Range   Specimen Description      BLOOD RIGHT HAND Performed at [facility], [address] [place], [place] [address]    Special Requests      BOTTLES DRAWN AEROBIC AND ANAEROBIC Blood Culture results may not be optimal due  to an inadequate volume of blood received in culture bottles Performed at [facility], [address] [place], [place] [address]    Culture      NO GROWTH < 12 HOURS Performed at [facility], [place] [address], [place], [place] [address]    Report Status PENDING   MRSA PCR Screening     Status: None   Collection Time: 11/17/19  1:39 AM   Specimen: Nasopharyngeal  Result Value Ref Range   MRSA by PCR NEGATIVE NEGATIVE    Comment:        The GeneXpert MRSA Assay (FDA approved for NASAL specimens only), is one component of a comprehensive MRSA colonization surveillance program. It is not intended to diagnose MRSA infection nor to guide or monitor treatment for MRSA infections. Performed at [facility], [place] [address], [place], [place] [address]   Hepatic function panel     Status: Abnormal   Collection Time: 11/17/19  6:16 AM  Result Value Ref Range   Total Protein 6.6 6.5 - 8.1 g/dL   Albumin 3.0 (L) 3.5 - 5.0 g/dL   AST 1,680 (H) 15 - 41 U/L   ALT 1,486 (H) 0 - 44 U/L   Alkaline Phosphatase 107 38 - 126 U/L   Total Bilirubin 4.0 (H) 0.3 - 1.2 mg/dL   Bilirubin, Direct 2.5 (H) 0.0 - 0.2 mg/dL   Indirect Bilirubin 1.5 (H) 0.3 - 0.9 mg/dL    Comment: Performed at [facility], [place] [address], [place], [place] [address]  CBC     Status: Abnormal   Collection Time: 11/17/19  6:16 AM  Result Value Ref Range   WBC 2.8 (L) 4.0 - 10.5 K/uL   RBC 4.89 4.22 - 5.81 MIL/uL   Hemoglobin 14.4 13.0 - 17.0 g/dL   HCT 44.3 39.0 - 52.0 %   MCV 90.6 80.0 - 100.0 fL   MCH 29.4 26.0 - 34.0 pg   MCHC 32.5 30.0 - 36.0 g/dL   RDW 13.9 11.5 - 15.5 %   Platelets 188 150 - 400 K/uL   nRBC 0.0 0.0 - 0.2 %    Comment: Performed at [facility], [place] [address] [place], [place] [address]  Basic metabolic panel     Status: Abnormal   Collection Time: 11/17/19  6:16 AM  Result Value Ref Range   Sodium 137 135 - 145 mmol/L     Potassium 3.7 3.5 - 5.1 mmol/L   Chloride 104 98 - 111 mmol/L   CO2 24 22 - 32 mmol/L   Glucose, Bld 126 (H) 70 - 99 mg/dL   BUN 7 6 - 20 mg/dL   Creatinine, Ser 0.83 0.61 - 1.24 mg/dL   Calcium 8.4 (L) 8.9 - 10.3 mg/dL   GFR calc non Af Amer >60 >60 mL/min   GFR calc Af Amer >60 >60 mL/min   Anion gap 9 5 - 15    Comment: Performed at [facility], [place] [address], [place],  [address]    DG  Chest 2 View  Result Date: 11/16/2019 CLINICAL DATA:  [place] 1 week ago.  Pain.  Smoker. EXAM: CHEST - 2 VIEW COMPARISON:  05/19/2017. FINDINGS: Cardiac silhouette is normal in size. No mediastinal or hilar masses or evidence of adenopathy. Clear lungs.  No pleural effusion or pneumothorax. Skeletal structures are intact. IMPRESSION: No active cardiopulmonary disease. Electronically Signed   By: Lajean Manes M.D.   On: 11/16/2019 12:50   DG Lumbar Spine Complete  Result Date: 11/16/2019 CLINICAL DATA:  [place] 1 week ago.  Pain. EXAM: LUMBAR SPINE - COMPLETE 4+ VIEW COMPARISON:  06/27/2017. FINDINGS: No fracture, bone lesion or spondylolisthesis. Mild loss of disc height at L4-L5 and L5-S1. There are facet degenerative changes which are most prominent in the mid to lower lumbar spine bilaterally. Endplate osteophytes extend from the lower thoracic spine to S1. Aortic calcifications are noted. Soft tissues are otherwise unremarkable. Findings are similar to the previous lumbar spine radiographs. IMPRESSION: 1. No fracture or acute finding.  No spondylolisthesis. 2. Degenerative changes as described. Electronically Signed   By: Lajean Manes M.D.   On: 11/16/2019 12:52   DG Forearm Left  Result Date: 11/16/2019 CLINICAL DATA:  [place] 1 week ago.  Pain. EXAM: LEFT FOREARM - 2 VIEW COMPARISON:  None. FINDINGS: No fracture or bone lesion. Wrist and elbow joints are normally aligned. There is soft tissue swelling most focal in prominent along the radial aspect of the distal forearm,  approximately 6 cm proximal to the wrist joint. IMPRESSION: 1. No fracture or dislocation. 2. Soft tissue swelling as detailed. Electronically Signed   By: Lajean Manes M.D.   On: 11/16/2019 12:48   CT ABDOMEN PELVIS W CONTRAST  Result Date: 11/16/2019 CLINICAL DATA:  Acute generalized abdominal pain.  Fall last week. EXAM: CT ABDOMEN AND PELVIS WITH CONTRAST TECHNIQUE: Multidetector CT imaging of the abdomen and pelvis was performed using the standard protocol following bolus administration of intravenous contrast. CONTRAST:  11m OMNIPAQUE IOHEXOL 300 MG/ML  SOLN COMPARISON:  08/05/2016 FINDINGS: Lower chest: Clear lung bases. Hepatobiliary: There is gallbladder wall thickening with hazy infiltration in the pericholecystic fat. No visualized gallstone. Gallbladder is mildly distended. No bile duct dilation. 4 mm low-attenuation lesion in the right lobe, segment 6, likely a cyst or hemangioma, but too small to characterized. This is not evident on the prior CT. No other liver masses or lesions. Liver normal in size. No liver contusion or laceration. Pancreas: Unremarkable. No pancreatic ductal dilatation or surrounding inflammatory changes. Spleen: Normal in size without focal abnormality. Adrenals/Urinary Tract: No adrenal masses. Kidneys normal size, orientation and position with symmetric enhancement and excretion. 7 mm posterior upper pole right renal cyst. No other renal masses, no stones and no hydronephrosis. Ureters are normal in course and in caliber. Bladder wall appears thickened accentuated by lack of distension. No bladder mass or stone. Stomach/Bowel: Stomach is within normal limits. Appendix appears normal. No evidence of bowel wall thickening, distention, or inflammatory changes. Vascular/Lymphatic: Prominent gastrohepatic ligament and periceliac lymph nodes, largest 12 mm in short axis. These appear increased from the prior CT. No other adenopathy. Aortic atherosclerosis. No aneurysm.  Reproductive: Unremarkable. Other: No abdominal wall hernia or abnormality. No abdominopelvic ascites. Musculoskeletal: No fracture or acute finding. No osteoblastic or osteolytic lesions. IMPRESSION: 1. Gallbladder wall thickening with hazy adjacent inflammation. Findings support acute cholecystitis, although no gallstone is seen. Recommend follow-up limited right upper quadrant ultrasound for further assessment. 2. Prominent to mildly enlarged Peri celiac and gastrohepatic ligament  lymph nodes. These are most likely reactive. 3. Bladder wall appears thickened which is similar to the prior CT. Consider cystitis if there are consistent clinical findings. 4. No other evidence of an acute abnormality. 5. Aortic atherosclerosis. Electronically Signed   By: Lajean Manes M.D.   On: 11/16/2019 16:35   US Abdomen Limited RUQ  Result Date: 11/16/2019 CLINICAL DATA:  Cholecystitis. Abdominal pain. Abnormal CT scan of the abdomen dated 11/16/2019 EXAM: ULTRASOUND ABDOMEN LIMITED RIGHT UPPER QUADRANT COMPARISON:  CT scan dated 11/16/2019 FINDINGS: Gallbladder: The gallbladder is contracted with a thick irregular wall. No discrete stones. Positive sonographic Murphy's sign. Small amount of pericholecystic fluid. Common bile duct: Diameter: 2 mm, normal. Liver: No focal lesion identified. Within normal limits in parenchymal echogenicity. Portal vein is patent on color Doppler imaging with normal direction of blood flow towards the liver. Other: None. IMPRESSION: Findings are consistent with acalculous cholecystitis. Electronically Signed   By: Lorriane Shire M.D.   On: 11/16/2019 19:42    Review of Systems  Constitutional: Positive for malaise/fatigue. Negative for chills and fever.  HENT: Negative for ear discharge, ear pain, hearing loss and tinnitus.   Eyes: Negative for blurred vision, double vision and photophobia.  Respiratory: Negative for cough, hemoptysis and sputum production.   Cardiovascular: Negative  for chest pain, palpitations and orthopnea.  Gastrointestinal: Positive for abdominal pain. Negative for constipation and diarrhea.  Genitourinary: Positive for hematuria. Negative for dysuria and urgency.  Musculoskeletal: Positive for back pain, falls and joint pain.  Skin: Negative for itching and rash.  Neurological: Positive for weakness. Negative for sensory change, speech change and focal weakness.  All other systems reviewed and are negative.  Blood pressure 113/69, pulse 62, temperature 98.2 F (36.8 C), temperature source Oral, resp. rate 17, height '5\' 8"'$  (1.727 m), weight 113.4 kg, SpO2 96 %. Physical Exam  Vitals reviewed. Constitutional: He is oriented to person, place, and time. He appears well-developed and well-nourished.  HENT:  Head: Normocephalic and atraumatic.  Eyes: Pupils are equal, round, and reactive to light. Conjunctivae and EOM are normal.  Cardiovascular: Normal rate and regular rhythm.  Respiratory: Effort normal and breath sounds normal.  GI: Soft. Bowel sounds are normal. He exhibits no distension. There is abdominal tenderness in the right upper quadrant.  Musculoskeletal:     Cervical back: Normal range of motion and neck supple.     Comments: Lower extremities able to move and have normal gross sensation but they have weakness issues  Neurological: He is alert and oriented to person, place, and time.  Skin: Skin is warm and dry.  Psychiatric: He has a normal mood and affect. His behavior is normal.    Assessment/Plan: 58 yo male with RUQ pain, transaminitis with AST and ALT > 1000, Hep A IgM+, Hep C reactive. On imaging no stones noted but concern for gallbladder wall thickening. I think this thickening is likely secondary to the acute hepatitis. I would not perform additional imaging unless further testing shows he does not have acute hepatitis. -no indication for surgery -would not recommend further gallbladder workup in the setting of acute viral  hepatitis  Arta Bruce Adan Baehr 11/17/2019, 8:14 AM

## 2019-11-18 ENCOUNTER — Inpatient Hospital Stay (HOSPITAL_COMMUNITY): Payer: Medicare Other

## 2019-11-18 LAB — HEPATIC FUNCTION PANEL
ALT: 1096 U/L — ABNORMAL HIGH (ref 0–44)
AST: 870 U/L — ABNORMAL HIGH (ref 15–41)
Albumin: 2.9 g/dL — ABNORMAL LOW (ref 3.5–5.0)
Alkaline Phosphatase: 101 U/L (ref 38–126)
Bilirubin, Direct: 2.7 mg/dL — ABNORMAL HIGH (ref 0.0–0.2)
Indirect Bilirubin: 1.5 mg/dL — ABNORMAL HIGH (ref 0.3–0.9)
Total Bilirubin: 4.2 mg/dL — ABNORMAL HIGH (ref 0.3–1.2)
Total Protein: 6.7 g/dL (ref 6.5–8.1)

## 2019-11-18 LAB — DIFFERENTIAL
Abs Immature Granulocytes: 0.01 10*3/uL (ref 0.00–0.07)
Basophils Absolute: 0.1 10*3/uL (ref 0.0–0.1)
Basophils Relative: 2 %
Eosinophils Absolute: 0.1 10*3/uL (ref 0.0–0.5)
Eosinophils Relative: 3 %
Immature Granulocytes: 0 %
Lymphocytes Relative: 44 %
Lymphs Abs: 1.3 10*3/uL (ref 0.7–4.0)
Monocytes Absolute: 0.3 10*3/uL (ref 0.1–1.0)
Monocytes Relative: 12 %
Neutro Abs: 1.2 10*3/uL — ABNORMAL LOW (ref 1.7–7.7)
Neutrophils Relative %: 39 %

## 2019-11-18 LAB — HCV RNA QUANT: HCV Quantitative: NOT DETECTED IU/mL (ref >50)

## 2019-11-18 LAB — BASIC METABOLIC PANEL
Anion gap: 8 (ref 5–15)
BUN: 5 mg/dL — ABNORMAL LOW (ref 6–20)
CO2: 25 mmol/L (ref 22–32)
Calcium: 8.6 mg/dL — ABNORMAL LOW (ref 8.9–10.3)
Chloride: 105 mmol/L (ref 98–111)
Creatinine, Ser: 0.67 mg/dL (ref 0.61–1.24)
GFR calc Af Amer: >60 mL/min (ref >60)
GFR calc non Af Amer: >60 mL/min (ref >60)
Glucose, Bld: 99 mg/dL (ref 70–99)
Potassium: 3.8 mmol/L (ref 3.5–5.1)
Sodium: 138 mmol/L (ref 135–145)

## 2019-11-18 LAB — CBC
HCT: 43.3 % (ref 39.0–52.0)
Hemoglobin: 14.2 g/dL (ref 13.0–17.0)
MCH: 29.4 pg (ref 26.0–34.0)
MCHC: 32.8 g/dL (ref 30.0–36.0)
MCV: 89.6 fL (ref 80.0–100.0)
Platelets: 199 10*3/uL (ref 150–400)
RBC: 4.83 MIL/uL (ref 4.22–5.81)
RDW: 14 % (ref 11.5–15.5)
WBC: 2.9 10*3/uL — ABNORMAL LOW (ref 4.0–10.5)
nRBC: 0 % (ref 0.0–0.2)

## 2019-11-18 LAB — PROTIME-INR
INR: 1.1 (ref 0.8–1.2)
Prothrombin Time: 13.7 seconds (ref 11.4–15.2)

## 2019-11-18 LAB — LIPASE, BLOOD: Lipase: 45 U/L (ref 11–51)

## 2019-11-18 MED ORDER — HYDROMORPHONE HCL 1 MG/ML IJ SOLN
INTRAMUSCULAR | Status: AC
  Filled 2019-11-18: qty 1

## 2019-11-18 MED ORDER — HYDROMORPHONE HCL 1 MG/ML IJ SOLN
0.5000 mg | INTRAMUSCULAR | Status: DC | PRN
  Administered 2019-11-18 (×2): 0.5 mg via INTRAVENOUS
  Filled 2019-11-18: qty 0.5

## 2019-11-18 MED ORDER — HYDROMORPHONE HCL 1 MG/ML IJ SOLN
1.0000 mg | INTRAMUSCULAR | Status: DC | PRN
  Administered 2019-11-18 – 2019-11-22 (×22): 1 mg via INTRAVENOUS
  Filled 2019-11-18 (×22): qty 1

## 2019-11-18 MED ORDER — OXYCODONE HCL 5 MG PO TABS
15.0000 mg | ORAL_TABLET | ORAL | Status: DC | PRN
  Administered 2019-11-18 – 2019-11-20 (×9): 15 mg via ORAL
  Filled 2019-11-18 (×9): qty 3

## 2019-11-18 NOTE — Progress Notes (Signed)
Patient continues to report significant pain despite receiving oral pain medication and IV morphine around the clock.  Could he have something stronger for his pain please. Roderick Pee

## 2019-11-18 NOTE — Progress Notes (Signed)
PROGRESS NOTE    Ronnie Moore  ZOX:[id number] DOB: Mar 05, 1961 DOA: 11/16/2019 PCP: Patient, No Pcp Per  Brief Narrative: Ronnie Moore is a 58 y.o. 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 He presented to the ED with nausea, vomiting, left upper arm abscess. He underwent I&D of the abscess, was started on IV vancomycin. Was found to have elevated LFTs. CT abd/pelvis with suspicion for acute cholecystitis without evidence of gallstone.  Prominent mildly enlarged periceliac gastrohepatic ligament lymph nodes are noted.   RUQ ultrasound showed changes consistent with a calculus cholecystitis, CBD diameter 2 mm. Patient started on IV Zosyn.   Positive for acute hepatitis A. Surgery consulted, felt like CT and US findings were secondary to hepatitis. GI consulted and following.   Assessment & Plan:   Principal Problem:   Transaminitis Active Problems:   Essential hypertension   Abscess of left upper extremity   Acalculous cholecystitis   Acute hepatitis A virus infection   Transaminitis in setting of acute hepatitis A viral infection: Hepatitis A IgM is reactive.  Acute hepatitis is likely cause of his transaminitis.  Serum acetaminophen level is undetectable.  Will continue supportive care. -Continue IV fluid hydration -Hold Tylenol -Hep C pending--patient reports being treated for it in the past -GI consulted, appreciate further recommendations -I contacted infection control and requested that they report this case of hepatitis A to the Health Dept.  -liver enzymes improving but t bili remains elevated.   Acalculous cholecystitis: Without evidence of gallstones on CT or ultrasound imaging.  CBD diameter is 2 mm.  He has continued RUQ abdominal pain.  Lipase is elevated without pancreatic ductal dilatation or  inflammation on CT imaging. -Continue IV fluid resuscitation as above -Continue pain control and antiemetics as needed. His pain worsened on 12/13, changed IV morphine as needed to IV dilaudid as needed.  -General surgery consulted, recommend continue medical management -Will continue IV Zosyn for now -Patient's diet advanced but he has nausea, will continue monitoring.  -Bilirubin remains elevated, repeat RUQ with findings again concerning for cholecystitis. Will need reevaluation by Surgery.   Abscess of left upper extremity: S/p I&D by EDP 11/16/2019.  Continue empiric IV vancomycin for now.  Hypertension: Currently stable.  Chronic paraplegia secondary to spinal cord stroke: Chronic and unchanged from baseline.  DVT prophylaxis: SCDs Code Status: Full code, confirmed with patient Family Communication: Discussed with patient, updated his sister via phone call. Disposition Plan: Pending clinical progress Consults called: GI, general surgery Admission status: Inpatient, anticipate patient requires greater than 2 midnight length stay for management of acute hepatitis A, acalculous cholecystitis, and left upper extremity abscess.  Procedures:  I&D of left upper arm on 12/11  Antimicrobials:  Vancomycin 12/11>> Zosyn 12/11>>   Subjective: He continues to have RUQ pain and nausea. He does not want to eat. Denies vomiting. Denies pain from his left arm.   Objective: Vitals:   11/17/19 0605 11/17/19 1348 11/17/19 2055 11/18/19 0605  BP: 113/69 137/83 (!) 128/96 136/90  Pulse: 62 69 66 62  Resp: '17 17 18 20  '$ Temp: 98.2 F (36.8 C) 98.9 F (37.2 C) 98.4 F (36.9 C) 98.4 F (36.9 C)  TempSrc: Oral Oral Oral Oral  SpO2: 96% 96% 97% 95%  Weight:      Height:        Intake/Output  Summary (Last 24 hours) at 11/18/2019 1548 Last data filed at 11/18/2019 1400 Gross per 24 hour  Intake 3451.6 ml  Output 4952 ml  Net -1500.4 ml   Filed Weights   11/16/19 1200    Weight: 113.4 kg    Examination:  General exam: Appears calm and comfortable  Respiratory system: No respiratory distress.  Cardiovascular system: RRR. Gastrointestinal system: Abdomen is nondistended, soft, with RUQ and epigastric tenderness with no rebound tenderness.  Central nervous system: Alert and oriented x3.  Extremities: Upper extremity with nl strength, strength 1/5 bl lower extremities.  Skin: Several healing scabs in upper arms. Left arm with bandage that is c/d/i. Psychiatry: Guarded affect.     Data Reviewed: I have personally reviewed following labs and imaging studies  CBC: Recent Labs  Lab 11/16/19 1300 11/17/19 0616 11/18/19 0558  WBC 3.2* 2.8* 2.9*  NEUTROABS 1.3*  --  1.2*  HGB 15.6 14.4 14.2  HCT 48.1 44.3 43.3  MCV 91.4 90.6 89.6  PLT 131* 188 811   Basic Metabolic Panel: Recent Labs  Lab 11/16/19 1300 11/17/19 0616 11/18/19 0558  NA 135 137 138  K 3.8 3.7 3.8  CL 101 104 105  CO2 20* 24 25  GLUCOSE 89 126* 99  BUN 10 7 5*  CREATININE 0.69 0.83 0.67  CALCIUM 8.8* 8.4* 8.6*   GFR: Estimated Creatinine Clearance: 126 mL/min (by C-G formula based on SCr of 0.67 mg/dL). Liver Function Tests: Recent Labs  Lab 11/16/19 1300 11/17/19 0616 11/18/19 0558  AST 2,300* 1,680* 870*  ALT 1,756* 1,486* 1,096*  ALKPHOS 132* 107 101  BILITOT 3.9* 4.0* 4.2*  PROT 7.3 6.6 6.7  ALBUMIN 3.5 3.0* 2.9*   Recent Labs  Lab 11/16/19 1432 11/18/19 0558  LIPASE 198* 45   No results for input(s): AMMONIA in the last 168 hours. Coagulation Profile: Recent Labs  Lab 11/16/19 1551 11/18/19 1147  INR 1.2 1.1   Cardiac Enzymes: Recent Labs  Lab 11/17/19 1743  CKTOTAL 52   BNP (last 3 results) No results for input(s): PROBNP in the last 8760 hours. HbA1C: No results for input(s): HGBA1C in the last 72 hours. CBG: No results for input(s): GLUCAP in the last 168 hours. Lipid Profile: No results for input(s): CHOL, HDL, LDLCALC, TRIG, CHOLHDL,  LDLDIRECT in the last 72 hours. Thyroid Function Tests: No results for input(s): TSH, T4TOTAL, FREET4, T3FREE, THYROIDAB in the last 72 hours. Anemia Panel: No results for input(s): VITAMINB12, FOLATE, FERRITIN, TIBC, IRON, RETICCTPCT in the last 72 hours. Sepsis Labs: No results for input(s): PROCALCITON, LATICACIDVEN in the last 168 hours.  Recent Results (from the past 240 hour(s))  SARS CORONAVIRUS 2 (TAT 6-24 HRS) Nasopharyngeal Nasopharyngeal Swab     Status: None   Collection Time: 11/16/19  1:45 PM   Specimen: Nasopharyngeal Swab  Result Value Ref Range Status   SARS Coronavirus 2 NEGATIVE NEGATIVE Final    Comment: (NOTE) SARS-CoV-2 target nucleic acids are NOT DETECTED. The SARS-CoV-2 RNA is generally detectable in upper and lower respiratory specimens during the acute phase of infection. Negative results do not preclude SARS-CoV-2 infection, do not rule out co-infections with other pathogens, and should not be used as the sole basis for treatment or other patient management decisions. Negative results must be combined with clinical observations, patient history, and epidemiological information. The expected result is Negative. Fact Sheet for Patients: SugarRoll.be Fact Sheet for Healthcare Providers: https://www.woods-mathews.com/ This test is not yet approved or cleared by the [place]  FDA and  has been authorized for detection and/or diagnosis of SARS-CoV-2 by FDA under an Emergency Use Authorization (EUA). This EUA will remain  in effect (meaning this test can be used) for the duration of the COVID-19 declaration under Section 56 4(b)(1) of the Act, 21 U.S.C. section 360bbb-3(b)(1), unless the authorization is terminated or revoked sooner. Performed at [facility], [place] [address] [place], [place] [address]   SARS Coronavirus 2 Ag (30 min TAT) - Nasal Swab (BD Veritor Kit)     Status: None   Collection Time:  11/16/19  3:51 PM   Specimen: Nasal Swab (BD Veritor Kit)  Result Value Ref Range Status   SARS Coronavirus 2 Ag NEGATIVE NEGATIVE Final    Comment: (NOTE) SARS-CoV-2 antigen NOT DETECTED.  Negative results are presumptive.  Negative results do not preclude SARS-CoV-2 infection and should not be used as the sole basis for treatment or other patient management decisions, including infection  control decisions, particularly in the presence of clinical signs and  symptoms consistent with COVID-19, or in those who have been in contact with the virus.  Negative results must be combined with clinical observations, patient history, and epidemiological information. The expected result is Negative. Fact Sheet for Patients: PodPark.tn Fact Sheet for Healthcare Providers: GiftContent.is This test is not yet approved or cleared by the [place] FDA and  has been authorized for detection and/or diagnosis of SARS-CoV-2 by FDA under an Emergency Use Authorization (EUA).  This EUA will remain in effect (meaning this test can be used) for the duration of  the COVID-19 de claration under Section 564(b)(1) of the Act, 21 U.S.C. section 360bbb-3(b)(1), unless the authorization is terminated or revoked sooner. Performed at [facility], [address] [place], [place] [address]   Culture, blood (routine x 2)     Status: None (Preliminary result)   Collection Time: 11/16/19  8:15 PM   Specimen: Right Antecubital; Blood  Result Value Ref Range Status   Specimen Description   Final    RIGHT ANTECUBITAL Performed at [facility], [address] [place], [place] [address]    Special Requests   Final    BOTTLES DRAWN AEROBIC AND ANAEROBIC Blood Culture adequate volume Performed at [facility], [address] [place], [place] [address]    Culture   Final    NO GROWTH 2 DAYS Performed at [facility], [place] [address] [place], [place] [address]    Report Status PENDING  Incomplete  Culture, blood (routine x 2)     Status: None (Preliminary result)   Collection Time: 11/16/19  8:20 PM   Specimen: BLOOD RIGHT HAND  Result Value Ref Range Status   Specimen Description   Final    BLOOD RIGHT HAND Performed at [facility], [address] [place], [place] [address]    Special Requests   Final    BOTTLES DRAWN AEROBIC AND ANAEROBIC Blood Culture results may not be optimal due to an inadequate volume of blood received in culture bottles Performed at [facility], [address] [place], [place] [address]    Culture   Final    NO GROWTH 2 DAYS Performed at [facility], [place] [address], [place], [place] [address]    Report Status PENDING  Incomplete  MRSA PCR Screening     Status: None   Collection Time: 11/17/19  1:39  AM   Specimen: Nasopharyngeal  Result Value Ref Range Status   MRSA by PCR NEGATIVE NEGATIVE Final    Comment:        The GeneXpert MRSA Assay (FDA approved for NASAL specimens only), is one component of a comprehensive MRSA colonization surveillance program. It is not intended to diagnose MRSA infection nor to guide or monitor treatment for MRSA infections. Performed at [facility], [place] [address], [place], [place] [address]          Radiology Studies: CT ABDOMEN PELVIS W CONTRAST  Result Date: 11/16/2019 CLINICAL DATA:  Acute generalized abdominal pain.  Fall last week. EXAM: CT ABDOMEN AND PELVIS WITH CONTRAST TECHNIQUE: Multidetector CT imaging of the abdomen and pelvis was performed using the standard protocol following bolus administration of intravenous contrast. CONTRAST:  159m OMNIPAQUE IOHEXOL 300 MG/ML  SOLN COMPARISON:  08/05/2016 FINDINGS: Lower chest: Clear lung bases. Hepatobiliary: There is gallbladder wall thickening with hazy infiltration in the pericholecystic fat. No visualized gallstone.  Gallbladder is mildly distended. No bile duct dilation. 4 mm low-attenuation lesion in the right lobe, segment 6, likely a cyst or hemangioma, but too small to characterized. This is not evident on the prior CT. No other liver masses or lesions. Liver normal in size. No liver contusion or laceration. Pancreas: Unremarkable. No pancreatic ductal dilatation or surrounding inflammatory changes. Spleen: Normal in size without focal abnormality. Adrenals/Urinary Tract: No adrenal masses. Kidneys normal size, orientation and position with symmetric enhancement and excretion. 7 mm posterior upper pole right renal cyst. No other renal masses, no stones and no hydronephrosis. Ureters are normal in course and in caliber. Bladder wall appears thickened accentuated by lack of distension. No bladder mass or stone. Stomach/Bowel: Stomach is within normal limits. Appendix appears normal. No evidence of bowel wall thickening, distention, or inflammatory changes. Vascular/Lymphatic: Prominent gastrohepatic ligament and periceliac lymph nodes, largest 12 mm in short axis. These appear increased from the prior CT. No other adenopathy. Aortic atherosclerosis. No aneurysm. Reproductive: Unremarkable. Other: No abdominal wall hernia or abnormality. No abdominopelvic ascites. Musculoskeletal: No fracture or acute finding. No osteoblastic or osteolytic lesions. IMPRESSION: 1. Gallbladder wall thickening with hazy adjacent inflammation. Findings support acute cholecystitis, although no gallstone is seen. Recommend follow-up limited right upper quadrant ultrasound for further assessment. 2. Prominent to mildly enlarged Peri celiac and gastrohepatic ligament lymph nodes. These are most likely reactive. 3. Bladder wall appears thickened which is similar to the prior CT. Consider cystitis if there are consistent clinical findings. 4. No other evidence of an acute abnormality. 5. Aortic atherosclerosis. Electronically Signed   By: DLajean Manes M.D.   On: 11/16/2019 16:35   UKoreaAbdomen Limited RUQ  Result Date: 11/18/2019 CLINICAL DATA:  58year old male with right upper quadrant pain, suspected acute cholecystitis on ultrasound and CT Abdomen and Pelvis 2 days ago. EXAM: ULTRASOUND ABDOMEN LIMITED RIGHT UPPER QUADRANT COMPARISON:  11/16/2019 ultrasound and CT Abdomen and Pelvis. FINDINGS: Gallbladder: Persistent gallbladder wall thickening up to 6-7 millimeters with positive sonographic Murphy sign elicited. As before, no sludge or stones are identified within the lumen. Trace if any pericholecystic fluid. Common bile duct: Diameter: 2 millimeters, normal. Liver: No focal lesion identified. Within normal limits in parenchymal echogenicity. Portal vein is patent on color Doppler imaging with normal direction of blood flow towards the liver. Other: Negative visible right kidney. IMPRESSION: 1. Continued moderate to severe gallbladder wall thickening and sonographic Murphy sign, but absence of visible gallstones as before. Trace  if any pericholecystic fluid. Differential considerations include chronic cholecystitis and gallbladder neoplasm (less likely) in addition to acute cholecystitis. 2. Negative liver and no evidence of bile duct obstruction. Electronically Signed   By: Genevie Ann M.D.   On: 11/18/2019 12:21   US Abdomen Limited RUQ  Result Date: 11/16/2019 CLINICAL DATA:  Cholecystitis. Abdominal pain. Abnormal CT scan of the abdomen dated 11/16/2019 EXAM: ULTRASOUND ABDOMEN LIMITED RIGHT UPPER QUADRANT COMPARISON:  CT scan dated 11/16/2019 FINDINGS: Gallbladder: The gallbladder is contracted with a thick irregular wall. No discrete stones. Positive sonographic Murphy's sign. Small amount of pericholecystic fluid. Common bile duct: Diameter: 2 mm, normal. Liver: No focal lesion identified. Within normal limits in parenchymal echogenicity. Portal vein is patent on color Doppler imaging with normal direction of blood flow towards the liver. Other:  None. IMPRESSION: Findings are consistent with acalculous cholecystitis. Electronically Signed   By: Lorriane Shire M.D.   On: 11/16/2019 19:42        Scheduled Meds: . loratadine  10 mg Oral Daily  . nicotine  14 mg Transdermal Daily   Continuous Infusions: . sodium chloride 10 mL/hr at 11/18/19 0413  . piperacillin-tazobactam (ZOSYN)  IV 3.375 g (11/18/19 1235)  . vancomycin 1,500 mg (11/18/19 0820)     LOS: 2 days    Time spent: 35 minutes   Blain Pais, MD [facility]   If 7PM-7AM, please contact night-coverage [email or web address] Password [facility] 11/18/2019, 3:48 PM

## 2019-11-18 NOTE — Progress Notes (Signed)
Progress Note    ASSESSMENT AND PLAN:    38. 58 yo male with acute HAV and possibly chronic HCV ( viral load pending). Having ongoing RUQ pain.  RUQ ultrasound suggesting acalculus cholecystitis.  Surgery evaluated, feels like gallbladder wall thickening is secondary to acute hepatitis.  Not recommending further gallbladder work-up in the setting of acute viral hepatitis. The degree of RUQ pain seems unusual for viral hepatitis. Breakfast tray untouched, says eating makes pain worse. Liver enzymes improving, still mildly cholestatic.  -Continue supportive care -am liver tests   2. Prominent gastrohepatic ligament and peri-celiac lymph nodes, largest 12 mm in short axis. These appear increased from the prior CT scan.     SUBJECTIVE   Still having significant RUQ pain. Anti-emetics helping nausea.     OBJECTIVE:     Vital signs in last 24 hours: Temp:  [98.4 F (36.9 C)-98.9 F (37.2 C)] 98.4 F (36.9 C) (12/13 0605) Pulse Rate:  [62-69] 62 (12/13 0605) Resp:  [17-20] 20 (12/13 0605) BP: (128-137)/(83-96) 136/90 (12/13 0605) SpO2:  [95 %-97 %] 95 % (12/13 0605) Last BM Date: 11/13/19 General:   Alert male in NAD EENT:  Normal hearing, non icteric sclera   Heart:  Regular rate and rhythm;  No lower extremity edema   Pulm: Normal respiratory effort   Abdomen:  Soft, nondistended, nontender.  Normal bowel sounds.          Neurologic:  Alert and  oriented x4;  grossly normal neurologically. Psych:  Pleasant, cooperative.  Normal mood and affect.   Intake/Output from previous day: 12/12 0701 - 12/13 0700 In: 3818.8 [P.O.:840; I.V.:1717.8; IV Piggyback:1261.1] Out: [id number] Intake/Output this shift: Total I/O In: 872.2 [P.O.:480; I.V.:74.2; IV Piggyback:318.1] Out: -   Lab Results: Recent Labs    11/16/19 1300 11/17/19 0616 11/18/19 0558  WBC 3.2* 2.8* 2.9*  HGB 15.6 14.4 14.2  HCT 48.1 44.3 43.3  PLT 131* 188 199   BMET Recent Labs   11/16/19 1300 11/17/19 0616 11/18/19 0558  NA 135 137 138  K 3.8 3.7 3.8  CL 101 104 105  CO2 20* 24 25  GLUCOSE 89 126* 99  BUN 10 7 5*  CREATININE 0.69 0.83 0.67  CALCIUM 8.8* 8.4* 8.6*   LFT Recent Labs    11/18/19 0558  PROT 6.7  ALBUMIN 2.9*  AST 870*  ALT 1,096*  ALKPHOS 101  BILITOT 4.2*  BILIDIR 2.7*  IBILI 1.5*   PT/INR Recent Labs    11/16/19 1551  LABPROT 14.7  INR 1.2   Hepatitis Panel Recent Labs    11/16/19 1432  HEPBSAG NON REACTIVE  HCVAB Reactive*  HEPAIGM Reactive*  HEPBIGM NON REACTIVE    DG Chest 2 View  Result Date: 11/16/2019 CLINICAL DATA:  [place] 1 week ago.  Pain.  Smoker. EXAM: CHEST - 2 VIEW COMPARISON:  05/19/2017. FINDINGS: Cardiac silhouette is normal in size. No mediastinal or hilar masses or evidence of adenopathy. Clear lungs.  No pleural effusion or pneumothorax. Skeletal structures are intact. IMPRESSION: No active cardiopulmonary disease. Electronically Signed   By: Lajean Manes M.D.   On: 11/16/2019 12:50   DG Lumbar Spine Complete  Result Date: 11/16/2019 CLINICAL DATA:  [place] 1 week ago.  Pain. EXAM: LUMBAR SPINE - COMPLETE 4+ VIEW COMPARISON:  06/27/2017. FINDINGS: No fracture, bone lesion or spondylolisthesis. Mild loss of disc height at L4-L5 and L5-S1. There are facet degenerative changes which are most prominent in the mid to  lower lumbar spine bilaterally. Endplate osteophytes extend from the lower thoracic spine to S1. Aortic calcifications are noted. Soft tissues are otherwise unremarkable. Findings are similar to the previous lumbar spine radiographs. IMPRESSION: 1. No fracture or acute finding.  No spondylolisthesis. 2. Degenerative changes as described. Electronically Signed   By: Lajean Manes M.D.   On: 11/16/2019 12:52   DG Forearm Left  Result Date: 11/16/2019 CLINICAL DATA:  [place] 1 week ago.  Pain. EXAM: LEFT FOREARM - 2 VIEW COMPARISON:  None. FINDINGS: No fracture or bone lesion. Wrist and elbow joints are  normally aligned. There is soft tissue swelling most focal in prominent along the radial aspect of the distal forearm, approximately 6 cm proximal to the wrist joint. IMPRESSION: 1. No fracture or dislocation. 2. Soft tissue swelling as detailed. Electronically Signed   By: Lajean Manes M.D.   On: 11/16/2019 12:48   CT ABDOMEN PELVIS W CONTRAST  Result Date: 11/16/2019 CLINICAL DATA:  Acute generalized abdominal pain.  Fall last week. EXAM: CT ABDOMEN AND PELVIS WITH CONTRAST TECHNIQUE: Multidetector CT imaging of the abdomen and pelvis was performed using the standard protocol following bolus administration of intravenous contrast. CONTRAST:  159mL OMNIPAQUE IOHEXOL 300 MG/ML  SOLN COMPARISON:  08/05/2016 FINDINGS: Lower chest: Clear lung bases. Hepatobiliary: There is gallbladder wall thickening with hazy infiltration in the pericholecystic fat. No visualized gallstone. Gallbladder is mildly distended. No bile duct dilation. 4 mm low-attenuation lesion in the right lobe, segment 6, likely a cyst or hemangioma, but too small to characterized. This is not evident on the prior CT. No other liver masses or lesions. Liver normal in size. No liver contusion or laceration. Pancreas: Unremarkable. No pancreatic ductal dilatation or surrounding inflammatory changes. Spleen: Normal in size without focal abnormality. Adrenals/Urinary Tract: No adrenal masses. Kidneys normal size, orientation and position with symmetric enhancement and excretion. 7 mm posterior upper pole right renal cyst. No other renal masses, no stones and no hydronephrosis. Ureters are normal in course and in caliber. Bladder wall appears thickened accentuated by lack of distension. No bladder mass or stone. Stomach/Bowel: Stomach is within normal limits. Appendix appears normal. No evidence of bowel wall thickening, distention, or inflammatory changes. Vascular/Lymphatic: Prominent gastrohepatic ligament and periceliac lymph nodes, largest 12 mm in  short axis. These appear increased from the prior CT. No other adenopathy. Aortic atherosclerosis. No aneurysm. Reproductive: Unremarkable. Other: No abdominal wall hernia or abnormality. No abdominopelvic ascites. Musculoskeletal: No fracture or acute finding. No osteoblastic or osteolytic lesions. IMPRESSION: 1. Gallbladder wall thickening with hazy adjacent inflammation. Findings support acute cholecystitis, although no gallstone is seen. Recommend follow-up limited right upper quadrant ultrasound for further assessment. 2. Prominent to mildly enlarged Peri celiac and gastrohepatic ligament lymph nodes. These are most likely reactive. 3. Bladder wall appears thickened which is similar to the prior CT. Consider cystitis if there are consistent clinical findings. 4. No other evidence of an acute abnormality. 5. Aortic atherosclerosis. Electronically Signed   By: Lajean Manes M.D.   On: 11/16/2019 16:35   US Abdomen Limited RUQ  Result Date: 11/16/2019 CLINICAL DATA:  Cholecystitis. Abdominal pain. Abnormal CT scan of the abdomen dated 11/16/2019 EXAM: ULTRASOUND ABDOMEN LIMITED RIGHT UPPER QUADRANT COMPARISON:  CT scan dated 11/16/2019 FINDINGS: Gallbladder: The gallbladder is contracted with a thick irregular wall. No discrete stones. Positive sonographic Murphy's sign. Small amount of pericholecystic fluid. Common bile duct: Diameter: 2 mm, normal. Liver: No focal lesion identified. Within normal limits in parenchymal echogenicity. Portal  vein is patent on color Doppler imaging with normal direction of blood flow towards the liver. Other: None. IMPRESSION: Findings are consistent with acalculous cholecystitis. Electronically Signed   By: Lorriane Shire M.D.   On: 11/16/2019 19:42   Principal Problem:   Transaminitis Active Problems:   Essential hypertension   Abscess of left upper extremity   Acalculous cholecystitis   Acute hepatitis A virus infection     LOS: 2 days   Ronnie Moore ,NP  11/18/2019, 10:12 AM

## 2019-11-19 DIAGNOSIS — R1011 Right upper quadrant pain: Secondary | ICD-10-CM

## 2019-11-19 DIAGNOSIS — B159 Hepatitis A without hepatic coma: Principal | ICD-10-CM

## 2019-11-19 DIAGNOSIS — R7401 Elevation of levels of liver transaminase levels: Secondary | ICD-10-CM

## 2019-11-19 LAB — COMPREHENSIVE METABOLIC PANEL
ALT: 840 U/L — ABNORMAL HIGH (ref 0–44)
AST: 535 U/L — ABNORMAL HIGH (ref 15–41)
Albumin: 2.9 g/dL — ABNORMAL LOW (ref 3.5–5.0)
Alkaline Phosphatase: 108 U/L (ref 38–126)
Anion gap: 9 (ref 5–15)
BUN: <5 mg/dL — ABNORMAL LOW (ref 6–20)
CO2: 24 mmol/L (ref 22–32)
Calcium: 8.8 mg/dL — ABNORMAL LOW (ref 8.9–10.3)
Chloride: 104 mmol/L (ref 98–111)
Creatinine, Ser: 0.68 mg/dL (ref 0.61–1.24)
GFR calc Af Amer: >60 mL/min (ref >60)
GFR calc non Af Amer: >60 mL/min (ref >60)
Glucose, Bld: 107 mg/dL — ABNORMAL HIGH (ref 70–99)
Potassium: 3.8 mmol/L (ref 3.5–5.1)
Sodium: 137 mmol/L (ref 135–145)
Total Bilirubin: 4.1 mg/dL — ABNORMAL HIGH (ref 0.3–1.2)
Total Protein: 6.8 g/dL (ref 6.5–8.1)

## 2019-11-19 LAB — CBC
HCT: 43.3 % (ref 39.0–52.0)
Hemoglobin: 14.2 g/dL (ref 13.0–17.0)
MCH: 29.2 pg (ref 26.0–34.0)
MCHC: 32.8 g/dL (ref 30.0–36.0)
MCV: 89.1 fL (ref 80.0–100.0)
Platelets: 220 10*3/uL (ref 150–400)
RBC: 4.86 MIL/uL (ref 4.22–5.81)
RDW: 14.2 % (ref 11.5–15.5)
WBC: 3.4 10*3/uL — ABNORMAL LOW (ref 4.0–10.5)
nRBC: 0 % (ref 0.0–0.2)

## 2019-11-19 LAB — HEPATITIS B SURFACE ANTIBODY, QUANTITATIVE: Hep B S AB Quant (Post): 5.3 m[IU]/mL — ABNORMAL LOW (ref >9.9)

## 2019-11-19 MED ORDER — DOXYCYCLINE HYCLATE 100 MG PO TABS
100.0000 mg | ORAL_TABLET | Freq: Two times a day (BID) | ORAL | Status: DC
  Administered 2019-11-19 – 2019-11-28 (×18): 100 mg via ORAL
  Filled 2019-11-19 (×18): qty 1

## 2019-11-19 NOTE — Care Management Important Message (Signed)
Important Message  Patient Details IM Letter given to Marney Doctor RN Care Manager to present to the Patient Name: PROCTOR KOBAK MRN: [id number] Date of Birth: 08-29-1961   Medicare Important Message Given:  Yes     Kerin Salen 11/19/2019, 10:51 AM

## 2019-11-19 NOTE — Progress Notes (Addendum)
Patient ID: Ronnie Moore, male   DOB: 1961-10-12, 58 y.o.   MRN: [id number]    Progress Note   Subjective  Day #3 CC;transaminitis/abd pain-acute hepatitis A  Patient continues to complain of right upper quadrant pain, currently 5 out of 10 and constant.  He also has severe chronic back pain and admits it is hard for him to tell what is hurting at times.  Still no appetite but trying to take full liquids. Patient does not feel that his pain control is adequate with current regimen.   Today's labs WBC 3.4, a hemoglobin 14.2, platelets 220 T bili 4.1/alk phos 108/ALT 840/AST 535-improving  Hep C RNA quant-HCV not detected HIV negative COVID-19 negative     Objective   Vital signs in last 24 hours: Temp:  [97.6 F (36.4 C)-98.7 F (37.1 C)] 97.6 F (36.4 C) (12/14 0536) Pulse Rate:  [59-69] 59 (12/14 0536) Resp:  [15-20] 15 (12/14 0536) BP: (137-147)/(77-85) 137/83 (12/14 0536) SpO2:  [95 %-99 %] 97 % (12/14 0536) Last BM Date: 11/13/19 General:   African-American male in NAD Heart:  Regular rate and rhythm; no murmurs Lungs: Respirations even and unlabored, lungs CTA bilaterally Abdomen:  Soft, tender epigastrium and right upper quadrant no rebound and nondistended. Normal bowel sounds.  No palpable mass or hepatosplenomegaly Extremities:  Without edema. Neurologic:  Alert and oriented,  grossly normal neurologically. Psych:  Cooperative. Normal mood and affect.  Intake/Output from previous day: 12/13 0701 - 12/14 0700 In: 2188 [P.O.:1020; I.V.:230.7; IV Piggyback:937.3] Out: 4450 [Urine:4450] Intake/Output this shift: No intake/output data recorded.  Lab Results: Recent Labs    11/17/19 0616 11/18/19 0558 11/19/19 0505  WBC 2.8* 2.9* 3.4*  HGB 14.4 14.2 14.2  HCT 44.3 43.3 43.3  PLT 188 199 220   BMET Recent Labs    11/17/19 0616 11/18/19 0558 11/19/19 0505  NA 137 138 137  K 3.7 3.8 3.8  CL 104 105 104  CO2 '24 25 24  '$ GLUCOSE 126* 99 107*  BUN 7  5* <5*  CREATININE 0.83 0.67 0.68  CALCIUM 8.4* 8.6* 8.8*   LFT Recent Labs    11/18/19 0558 11/19/19 0505  PROT 6.7 6.8  ALBUMIN 2.9* 2.9*  AST 870* 535*  ALT 1,096* 840*  ALKPHOS 101 108  BILITOT 4.2* 4.1*  BILIDIR 2.7*  --   IBILI 1.5*  --    PT/INR Recent Labs    11/16/19 1551 11/18/19 1147  LABPROT 14.7 13.7  INR 1.2 1.1    Studies/Results: US Abdomen Limited RUQ  Result Date: 11/18/2019 CLINICAL DATA:  58 year old male with right upper quadrant pain, suspected acute cholecystitis on ultrasound and CT Abdomen and Pelvis 2 days ago. EXAM: ULTRASOUND ABDOMEN LIMITED RIGHT UPPER QUADRANT COMPARISON:  11/16/2019 ultrasound and CT Abdomen and Pelvis. FINDINGS: Gallbladder: Persistent gallbladder wall thickening up to 6-7 millimeters with positive sonographic Murphy sign elicited. As before, no sludge or stones are identified within the lumen. Trace if any pericholecystic fluid. Common bile duct: Diameter: 2 millimeters, normal. Liver: No focal lesion identified. Within normal limits in parenchymal echogenicity. Portal vein is patent on color Doppler imaging with normal direction of blood flow towards the liver. Other: Negative visible right kidney. IMPRESSION: 1. Continued moderate to severe gallbladder wall thickening and sonographic Murphy sign, but absence of visible gallstones as before. Trace if any pericholecystic fluid. Differential considerations include chronic cholecystitis and gallbladder neoplasm (less likely) in addition to acute cholecystitis. 2. Negative liver and no evidence of bile  duct obstruction. Electronically Signed   By: Genevie Ann M.D.   On: 11/18/2019 12:21       Assessment / Plan:    #84 58 year old African-American male with acute hepatitis A, presenting with acute right upper quadrant abdominal pain and nausea.  Initial imaging with ultrasound shows gallbladder wall thickening moderate to severe but no gallstones and no pericholecystic  fluid  Transaminases are improving, no evidence of hepatic decompensation.  Right upper quadrant pain is secondary to acute hepatitis.  Discussed with patient today and advised he may have abdominal pain for several weeks.  #2 positive hep C antibody, negative hep C PCR RNA quant #3 forearm abscess-status post drainage in ER, continues antibiotics #4 chronic back pain-on high-dose opioids as an outpatient.  Plan.  Continue supportive management Hepatic panel and INR tomorrow. Hopefully patient can be discharged soon, with outpatient follow-up.  For now requiring increasing doses of narcotics.    Principal Problem:   Transaminitis Active Problems:   Essential hypertension   Abscess of left upper extremity   Acalculous cholecystitis   Acute hepatitis A virus infection    LOS: 3 days   Amy EsterwoodPA-C  11/19/2019, 8:57 AM   GI ATTENDING  Interval history data reviewed.  Patient personally seen and examined.  Agree with interval progress note as outlined.  Patient recovering from acute hepatitis A.  INR normal.  LFTs improving.  Right upper quadrant pain secondary to capsular distention from acute hepatitis.  Continue with ongoing supportive care.  Patient can follow-up with his primary care provider as an outpatient to continue to trend his LFTs.  No additional recommendations from a GI standpoint.  Will sign off.  Docia Chuck. Geri Seminole., M.D. [facility] Division of Gastroenterology

## 2019-11-19 NOTE — Progress Notes (Addendum)
PROGRESS NOTE    Ronnie Moore  EHU:[id number] DOB: 03/08/1961 DOA: 11/16/2019 PCP: Patient, No Pcp Per  Brief Narrative: Ronnie Moore is a 58 y.o. 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 He presented to the ED with nausea, vomiting, left upper arm abscess. He underwent I&D of the abscess, was started on IV vancomycin. Was found to have elevated LFTs. CT abd/pelvis with suspicion for acute cholecystitis without evidence of gallstone.  Prominent mildly enlarged periceliac gastrohepatic ligament lymph nodes are noted.   RUQ ultrasound showed changes consistent with a calculus cholecystitis, CBD diameter 2 mm. Patient started on IV Zosyn.   Positive for acute hepatitis A. Surgery consulted, felt like CT and US findings were secondary to hepatitis. GI consulted and following.   Assessment & Plan:   Principal Problem:   Transaminitis Active Problems:   Essential hypertension   Abscess of left upper extremity   Acalculous cholecystitis   Acute hepatitis A virus infection   Transaminitis in setting of acute hepatitis A viral infection: Hepatitis A IgM is reactive.  Acute hepatitis is likely cause of his transaminitis.  Serum acetaminophen level is undetectable.  Will continue supportive care. -Continue IV fluid hydration -Hold Tylenol -Hep C negative--patient reports being treated for it in the past -GI consulted, appreciate further recommendations -I contacted infection control and requested that they report this case of hepatitis A to the Health Dept.  -liver enzymes improving but still elevated.   Acalculous cholecystitis: Without evidence of gallstones on CT or ultrasound imaging.  CBD diameter is 2 mm.  He has continued RUQ abdominal pain.  Lipase is elevated without pancreatic ductal dilatation or  inflammation on CT imaging. -Continue IV fluid resuscitation as above -Continue pain control and antiemetics as needed. His pain worsened on 12/13, changed IV morphine as needed to IV dilaudid as needed. Patient encouraged to alternated oxycodone and Dilaudid for pain control.  -General surgery consulted, recommend continue medical management -Bilirubin remains elevated, repeat RUQ with findings again concerning for cholecystitis. General Surgery contacted again on 12/14, recommend medical management as findings in [place] are thought to still be secondary to acute hepatitis.  -Continue diet, full liquid.  -Discontinued Zosyn.   Neutropenia.  WBC trending up. Likely due to viral infection.  -Repeat labs in am.   Abscess of left upper extremity: S/p I&D by EDP 11/16/2019.  DC vancomycin, start doxycycline.  Hypertension: Currently stable.  Chronic paraplegia secondary to spinal cord stroke: Chronic and unchanged from baseline. Needs PT evaluation.   DVT prophylaxis: SCDs Code Status: Full code, confirmed with patient Family Communication: Discussed with patient, updated his sister via phone call. Disposition Plan: Pending clinical progress, needs PT eval for disposition planning.  Consults called: GI, general surgery Admission status: Inpatient Procedures:  I&D of left upper arm on 12/11  Antimicrobials:  Vancomycin 12/11>>12/14 Zosyn 12/11>>12/14 Doxycycline 12/14>>   Subjective: Patient seen this afternoon. Continue to have nausea but tolerating a full liquid diet better today. Continues to have RUQ and epigastric pain but this is improving.   Objective: Vitals:   11/18/19 2101 11/19/19 0536 11/19/19 1302 11/19/19 2024  BP: (!) 147/77 137/83 (!) 148/85 (!) 158/81  Pulse: 69 (!) 59 (!) 59 67  Resp: '18 15 18 18  '$ Temp: 98.7 F (37.1 C) 97.6 F (36.4 C) 97.8 F (36.6 C) 98.6 F (  37 C)  TempSrc: Oral Oral Oral Oral  SpO2: 99% 97% 96% 96%  Weight:      Height:         Intake/Output Summary (Last 24 hours) at 11/19/2019 2035 Last data filed at 11/19/2019 1823 Gross per 24 hour  Intake 2767.04 ml  Output 2900 ml  Net -132.96 ml   Filed Weights   11/16/19 1200  Weight: 113.4 kg    Examination:  General exam: Appears calm and comfortable  Respiratory system: No respiratory distress.  Cardiovascular system: RRR. Gastrointestinal system: Abdomen is nondistended, soft, with RUQ and epigastric tenderness with no rebound tenderness.  Central nervous system: Alert and oriented x3.  Extremities: Upper extremity with nl strength, strength 1/5 bl lower extremities.  Skin: Several healing scabs in upper arms. Left arm with bandage that is c/d/i. Psychiatry: Guarded affect.     Data Reviewed: I have personally reviewed following labs and imaging studies  CBC: Recent Labs  Lab 11/16/19 1300 11/17/19 0616 11/18/19 0558 11/19/19 0505  WBC 3.2* 2.8* 2.9* 3.4*  NEUTROABS 1.3*  --  1.2*  --   HGB 15.6 14.4 14.2 14.2  HCT 48.1 44.3 43.3 43.3  MCV 91.4 90.6 89.6 89.1  PLT 131* 188 199 637   Basic Metabolic Panel: Recent Labs  Lab 11/16/19 1300 11/17/19 0616 11/18/19 0558 11/19/19 0505  NA 135 137 138 137  K 3.8 3.7 3.8 3.8  CL 101 104 105 104  CO2 20* '24 25 24  '$ GLUCOSE 89 126* 99 107*  BUN 10 7 5* <5*  CREATININE 0.69 0.83 0.67 0.68  CALCIUM 8.8* 8.4* 8.6* 8.8*   GFR: Estimated Creatinine Clearance: 126 mL/min (by C-G formula based on SCr of 0.68 mg/dL). Liver Function Tests: Recent Labs  Lab 11/16/19 1300 11/17/19 0616 11/18/19 0558 11/19/19 0505  AST 2,300* 1,680* 870* 535*  ALT 1,756* 1,486* 1,096* 840*  ALKPHOS 132* 107 101 108  BILITOT 3.9* 4.0* 4.2* 4.1*  PROT 7.3 6.6 6.7 6.8  ALBUMIN 3.5 3.0* 2.9* 2.9*   Recent Labs  Lab 11/16/19 1432 11/18/19 0558  LIPASE 198* 45   No results for input(s): AMMONIA in the last 168 hours. Coagulation Profile: Recent Labs  Lab 11/16/19 1551 11/18/19 1147  INR 1.2 1.1    Cardiac Enzymes: Recent Labs  Lab 11/17/19 1743  CKTOTAL 52   BNP (last 3 results) No results for input(s): PROBNP in the last 8760 hours. HbA1C: No results for input(s): HGBA1C in the last 72 hours. CBG: No results for input(s): GLUCAP in the last 168 hours. Lipid Profile: No results for input(s): CHOL, HDL, LDLCALC, TRIG, CHOLHDL, LDLDIRECT in the last 72 hours. Thyroid Function Tests: No results for input(s): TSH, T4TOTAL, FREET4, T3FREE, THYROIDAB in the last 72 hours. Anemia Panel: No results for input(s): VITAMINB12, FOLATE, FERRITIN, TIBC, IRON, RETICCTPCT in the last 72 hours. Sepsis Labs: No results for input(s): PROCALCITON, LATICACIDVEN in the last 168 hours.  Recent Results (from the past 240 hour(s))  SARS CORONAVIRUS 2 (TAT 6-24 HRS) Nasopharyngeal Nasopharyngeal Swab     Status: None   Collection Time: 11/16/19  1:45 PM   Specimen: Nasopharyngeal Swab  Result Value Ref Range Status   SARS Coronavirus 2 NEGATIVE NEGATIVE Final    Comment: (NOTE) SARS-CoV-2 target nucleic acids are NOT DETECTED. The SARS-CoV-2 RNA is generally detectable in upper and lower respiratory specimens during the acute phase of infection. Negative results do not preclude SARS-CoV-2 infection, do not rule out co-infections with other pathogens,  and should not be used as the sole basis for treatment or other patient management decisions. Negative results must be combined with clinical observations, patient history, and epidemiological information. The expected result is Negative. Fact Sheet for Patients: SugarRoll.be Fact Sheet for Healthcare Providers: https://www.woods-mathews.com/ This test is not yet approved or cleared by the [place] FDA and  has been authorized for detection and/or diagnosis of SARS-CoV-2 by FDA under an Emergency Use Authorization (EUA). This EUA will remain  in effect (meaning this test can be used) for the  duration of the COVID-19 declaration under Section 56 4(b)(1) of the Act, 21 U.S.C. section 360bbb-3(b)(1), unless the authorization is terminated or revoked sooner. Performed at [facility], [place] [address] [place], [place] [address]   SARS Coronavirus 2 Ag (30 min TAT) - Nasal Swab (BD Veritor Kit)     Status: None   Collection Time: 11/16/19  3:51 PM   Specimen: Nasal Swab (BD Veritor Kit)  Result Value Ref Range Status   SARS Coronavirus 2 Ag NEGATIVE NEGATIVE Final    Comment: (NOTE) SARS-CoV-2 antigen NOT DETECTED.  Negative results are presumptive.  Negative results do not preclude SARS-CoV-2 infection and should not be used as the sole basis for treatment or other patient management decisions, including infection  control decisions, particularly in the presence of clinical signs and  symptoms consistent with COVID-19, or in those who have been in contact with the virus.  Negative results must be combined with clinical observations, patient history, and epidemiological information. The expected result is Negative. Fact Sheet for Patients: PodPark.tn Fact Sheet for Healthcare Providers: GiftContent.is This test is not yet approved or cleared by the [place] FDA and  has been authorized for detection and/or diagnosis of SARS-CoV-2 by FDA under an Emergency Use Authorization (EUA).  This EUA will remain in effect (meaning this test can be used) for the duration of  the COVID-19 de claration under Section 564(b)(1) of the Act, 21 U.S.C. section 360bbb-3(b)(1), unless the authorization is terminated or revoked sooner. Performed at [facility], [address] [place], [place] [address]   Culture, blood (routine x 2)     Status: None (Preliminary result)   Collection Time: 11/16/19  8:15 PM   Specimen: Right Antecubital; Blood  Result Value Ref Range Status   Specimen Description   Final     RIGHT ANTECUBITAL Performed at [facility], [address] [place], [place] [address]    Special Requests   Final    BOTTLES DRAWN AEROBIC AND ANAEROBIC Blood Culture adequate volume Performed at [facility], [address] [place], [place] [address]    Culture   Final    NO GROWTH 3 DAYS Performed at [facility], [place] [address], [place], [place] [address]    Report Status PENDING  Incomplete  Culture, blood (routine x 2)     Status: None (Preliminary result)   Collection Time: 11/16/19  8:20 PM   Specimen: BLOOD RIGHT HAND  Result Value Ref Range Status   Specimen Description   Final    BLOOD RIGHT HAND Performed at [facility], [address] [place], [place] [address]    Special Requests   Final    BOTTLES DRAWN AEROBIC AND ANAEROBIC Blood Culture results may not be optimal due to an inadequate volume of blood received in culture bottles Performed at [facility], [address] [place], [place] [address]    Culture   Final    NO GROWTH 3 DAYS Performed at [facility], [place] [address] [place], [place] [address]    Report Status PENDING  Incomplete  MRSA PCR Screening     Status: None   Collection Time: 11/17/19  1:39 AM   Specimen: Nasopharyngeal  Result Value Ref Range Status   MRSA by PCR NEGATIVE NEGATIVE Final    Comment:        The GeneXpert MRSA Assay (FDA approved for NASAL specimens only), is one component of a comprehensive MRSA colonization surveillance program. It is not intended to diagnose MRSA infection nor to guide or monitor treatment for MRSA infections. Performed at [facility], [place] [address], [place], [place] [address]          Radiology Studies: US Abdomen Limited RUQ  Result Date: 11/18/2019 CLINICAL DATA:  58 year old male with right upper quadrant pain, suspected acute cholecystitis on ultrasound and CT Abdomen and Pelvis 2 days ago. EXAM: ULTRASOUND  ABDOMEN LIMITED RIGHT UPPER QUADRANT COMPARISON:  11/16/2019 ultrasound and CT Abdomen and Pelvis. FINDINGS: Gallbladder: Persistent gallbladder wall thickening up to 6-7 millimeters with positive sonographic Murphy sign elicited. As before, no sludge or stones are identified within the lumen. Trace if any pericholecystic fluid. Common bile duct: Diameter: 2 millimeters, normal. Liver: No focal lesion identified. Within normal limits in parenchymal echogenicity. Portal vein is patent on color Doppler imaging with normal direction of blood flow towards the liver. Other: Negative visible right kidney. IMPRESSION: 1. Continued moderate to severe gallbladder wall thickening and sonographic Murphy sign, but absence of visible gallstones as before. Trace if any pericholecystic fluid. Differential considerations include chronic cholecystitis and gallbladder neoplasm (less likely) in addition to acute cholecystitis. 2. Negative liver and no evidence of bile duct obstruction. Electronically Signed   By: Genevie Ann M.D.   On: 11/18/2019 12:21        Scheduled Meds: . doxycycline  100 mg Oral Q12H  . loratadine  10 mg Oral Daily  . nicotine  14 mg Transdermal Daily   Continuous Infusions: . sodium chloride 10 mL/hr at 11/18/19 0413     LOS: 3 days    Time spent: 25 minutes   Blain Pais, MD [facility]   If 7PM-7AM, please contact night-coverage [email or web address] Password [facility] 11/19/2019, 8:35 PM

## 2019-11-20 LAB — PROTIME-INR
INR: 1 (ref 0.8–1.2)
Prothrombin Time: 13 seconds (ref 11.4–15.2)

## 2019-11-20 LAB — CBC WITH DIFFERENTIAL/PLATELET
Abs Immature Granulocytes: 0.01 10*3/uL (ref 0.00–0.07)
Basophils Absolute: 0.1 10*3/uL (ref 0.0–0.1)
Basophils Relative: 2 %
Eosinophils Absolute: 0.1 10*3/uL (ref 0.0–0.5)
Eosinophils Relative: 3 %
HCT: 45.3 % (ref 39.0–52.0)
Hemoglobin: 14.9 g/dL (ref 13.0–17.0)
Immature Granulocytes: 0 %
Lymphocytes Relative: 36 %
Lymphs Abs: 1.4 10*3/uL (ref 0.7–4.0)
MCH: 29.6 pg (ref 26.0–34.0)
MCHC: 32.9 g/dL (ref 30.0–36.0)
MCV: 90.1 fL (ref 80.0–100.0)
Monocytes Absolute: 0.5 10*3/uL (ref 0.1–1.0)
Monocytes Relative: 13 %
Neutro Abs: 1.7 10*3/uL (ref 1.7–7.7)
Neutrophils Relative %: 46 %
Platelets: 238 10*3/uL (ref 150–400)
RBC: 5.03 MIL/uL (ref 4.22–5.81)
RDW: 14.7 % (ref 11.5–15.5)
WBC: 3.8 10*3/uL — ABNORMAL LOW (ref 4.0–10.5)
nRBC: 0 % (ref 0.0–0.2)

## 2019-11-20 LAB — COMPREHENSIVE METABOLIC PANEL
ALT: 675 U/L — ABNORMAL HIGH (ref 0–44)
AST: 362 U/L — ABNORMAL HIGH (ref 15–41)
Albumin: 2.9 g/dL — ABNORMAL LOW (ref 3.5–5.0)
Alkaline Phosphatase: 120 U/L (ref 38–126)
Anion gap: 9 (ref 5–15)
BUN: 6 mg/dL (ref 6–20)
CO2: 23 mmol/L (ref 22–32)
Calcium: 8.4 mg/dL — ABNORMAL LOW (ref 8.9–10.3)
Chloride: 103 mmol/L (ref 98–111)
Creatinine, Ser: 0.63 mg/dL (ref 0.61–1.24)
GFR calc Af Amer: >60 mL/min (ref >60)
GFR calc non Af Amer: >60 mL/min (ref >60)
Glucose, Bld: 112 mg/dL — ABNORMAL HIGH (ref 70–99)
Potassium: 5.6 mmol/L — ABNORMAL HIGH (ref 3.5–5.1)
Sodium: 135 mmol/L (ref 135–145)
Total Bilirubin: 4.6 mg/dL — ABNORMAL HIGH (ref 0.3–1.2)
Total Protein: 6.7 g/dL (ref 6.5–8.1)

## 2019-11-20 LAB — BASIC METABOLIC PANEL
Anion gap: 10 (ref 5–15)
BUN: 6 mg/dL (ref 6–20)
CO2: 23 mmol/L (ref 22–32)
Calcium: 8.7 mg/dL — ABNORMAL LOW (ref 8.9–10.3)
Chloride: 104 mmol/L (ref 98–111)
Creatinine, Ser: 0.65 mg/dL (ref 0.61–1.24)
GFR calc Af Amer: >60 mL/min (ref >60)
GFR calc non Af Amer: >60 mL/min (ref >60)
Glucose, Bld: 119 mg/dL — ABNORMAL HIGH (ref 70–99)
Potassium: 4.2 mmol/L (ref 3.5–5.1)
Sodium: 137 mmol/L (ref 135–145)

## 2019-11-20 MED ORDER — SODIUM CHLORIDE 0.9% FLUSH
10.0000 mL | Freq: Two times a day (BID) | INTRAVENOUS | Status: DC
  Administered 2019-11-20 – 2019-11-28 (×15): 10 mL

## 2019-11-20 MED ORDER — SODIUM CHLORIDE 0.9% FLUSH: 10.0000 mL | INTRAVENOUS | Status: DC | PRN

## 2019-11-20 MED ORDER — OXYCODONE HCL 5 MG PO TABS
20.0000 mg | ORAL_TABLET | ORAL | Status: DC | PRN
  Administered 2019-11-20 – 2019-11-28 (×27): 20 mg via ORAL
  Filled 2019-11-20 (×28): qty 4

## 2019-11-20 NOTE — Progress Notes (Signed)
Occupational Therapy Evaluation Patient Details Name: Ronnie Moore MRN: [id number] DOB: [birth year] Today's Date: 11/20/2019    History of Present Illness CLEATIS BELVILLE is a 58 y.o. 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   Clinical Impression   PTA, pt was living at home with his son, who works during the day, pt reports he required assistance from his aide with ADL/IADL and transfers. Pt reports his home setup does not support safety or independence with mobility. Pt reports multiple falls at home during tub transfers and coming down his ramp. Pt's housing setup poses multiple safety risks for the pt. Pt currently requires maxA+2 for squat pivot from EOB to recliner. He demonstrates cognitive limitations and physical limitations impacting safety and independence with ADL/IADL and functional mobility. Due to decline in current level of function, pt would benefit from acute OT to address established goals to facilitate safe D/C to venue listed below. At this time, recommend SNF follow-up. Will continue to follow acutely.     Follow Up Recommendations  SNF;Supervision/Assistance - 24 hour    Equipment Recommendations  3 in 1 bedside commode;Hospital bed    Recommendations for Other Services       Precautions / Restrictions Precautions Precautions: Fall Restrictions Weight Bearing Restrictions: No      Mobility Bed Mobility Overal bed mobility: Needs Assistance Bed Mobility: Rolling;Sidelying to Sit;Sit to Supine Rolling: Mod assist Sidelying to sit: Max assist;+2 for safety/equipment;HOB elevated   Sit to supine: Min assist   General bed mobility comments: modA for rolling in bed;maxA to progress trunk to upright position;minA to return BLE to bed  Transfers Overall transfer level: Needs  assistance Equipment used: 2 person hand held assist Transfers: Squat Pivot Transfers;Sit to/from Stand Sit to Stand: Total assist;+2 physical assistance;+2 safety/equipment   Squat pivot transfers: Max assist;+2 safety/equipment;+2 physical assistance     General transfer comment: maxA to transfer from EOB to drop arm recliner and transfer back to bed;unable to progress into standing in full upright posture;attempted 2x sit<>stand pt R knee buckled, required return to sitting    Balance Overall balance assessment: Needs assistance Sitting-balance support: Single extremity supported;Feet supported Sitting balance-Leahy Scale: Poor Sitting balance - Comments: minor LOB with initial sit EOB   Standing balance support: Bilateral upper extremity supported Standing balance-Leahy Scale: Zero Standing balance comment: unable to progress to upright position;maxA for squat piviot from EOB<>recliner                           ADL either performed or assessed with clinical judgement   ADL Overall ADL's : Needs assistance/impaired Eating/Feeding: Set up;Sitting   Grooming: Set up;Sitting   Upper Body Bathing: Minimal assistance;Sitting   Lower Body Bathing: Maximal assistance;Sitting/lateral leans   Upper Body Dressing : Minimal assistance;Sitting   Lower Body Dressing: Maximal assistance;Sitting/lateral leans   Toilet Transfer: Maximal assistance;Squat-pivot;+2 for physical assistance;+2 for safety/equipment Toilet Transfer Details (indicate cue type and reason): simulated to droparm recliner from EOB Toileting- Clothing Manipulation and Hygiene: Total assistance       Functional mobility during ADLs: Maximal assistance;+2 for physical assistance;+2 for safety/equipment General ADL Comments: pt with decreased awareness of safety during mobility;maxA+2 for squat pivot transfer from EOB to recliner     Vision Baseline Vision/History: Wears glasses Wears Glasses: Reading  only Patient Visual Report: No change from baseline       Perception     Praxis      Pertinent Vitals/Pain Pain Assessment: Faces Faces Pain Scale: Hurts whole lot Pain Location: back Pain Descriptors / Indicators: Grimacing;Guarding Pain Intervention(s): Limited activity within patient's tolerance;Monitored during session;Patient requesting pain meds-RN notified     Hand Dominance Right   Extremity/Trunk Assessment Upper Extremity Assessment Upper Extremity Assessment: Generalized weakness;LUE deficits/detail LUE Deficits / Details: surgical dressing in place, pain with movement;reports BUE feeling cold  LUE: Unable to fully assess due to pain LUE Coordination: WNL   Lower Extremity Assessment Lower Extremity Assessment: Defer to PT evaluation       Communication Communication Communication: No difficulties   Cognition Arousal/Alertness: Awake/alert Behavior During Therapy: WFL for tasks assessed/performed Overall Cognitive Status: No family/caregiver present to determine baseline cognitive functioning Area of Impairment: Attention;Memory;Following commands;Safety/judgement;Awareness;Problem solving                   Current Attention Level: Sustained Memory: Decreased recall of precautions;Decreased short-term memory Following Commands: Follows one step commands consistently;Follows one step commands with increased time;Follows multi-step commands with increased time Safety/Judgement: Decreased awareness of safety;Decreased awareness of deficits Awareness: Emergent Problem Solving: Slow processing;Decreased initiation;Difficulty sequencing;Requires verbal cues;Requires tactile cues General Comments: pt demonstrates limitations with processing information,   General Comments  vss throughout    Exercises     Shoulder Instructions      Home Living Family/patient expects to be discharged to:: Private residence Living Arrangements: Children Available Help  at Discharge: Family;Available PRN/intermittently Type of Home: Other(Comment)(duplex) Home Access: Ramped entrance     Home Layout: One level     Bathroom Shower/Tub: Teacher, early years/pre: Standard Bathroom Accessibility: No   Home Equipment: Tub bench;Wheelchair - power;Hospital bed(tilt in space wheelchair)   Additional Comments: fallen a couple times coming out of the shower      Prior Functioning/Environment Level of Independence: Needs assistance  Gait / Transfers Assistance Needed: power wheelchair doesn't fit in bathroom;leaves chair at bathroom door and pivots around in bathroom to get to the shower;stand-pivot primarily for transfers ADL's / Homemaking Assistance Needed: aide assists with bathing,    Comments: sleeps in hospital bed(reports current bed is not in good condition);aide is there every day from 10am to lunch time and returns in the afternoon;pt stays in chair until she returns         OT Problem List: Decreased strength;Decreased range of motion;Decreased activity tolerance;Impaired balance (sitting and/or standing);Decreased safety awareness;Decreased knowledge of use of DME or AE;Decreased knowledge of precautions;Obesity;Impaired UE functional use;Pain      OT Treatment/Interventions: Self-care/ADL training;Therapeutic exercise;Energy conservation;DME and/or AE instruction;Therapeutic activities;Patient/family education;Balance training    OT Goals(Current goals can be found in the care plan section) Acute Rehab OT Goals Patient Stated Goal: to find a better housing situation OT Goal Formulation: With patient Time For Goal Achievement: 12/04/19 Potential to Achieve Goals: Good ADL Goals Pt Will Perform Grooming: sitting;with modified independence Pt Will Perform Lower Body Dressing: with min assist;sitting/lateral leans;with adaptive equipment Pt Will Transfer to Toilet: with min assist;stand pivot transfer Pt Will Perform Toileting -  Clothing Manipulation and hygiene: with min assist;sitting/lateral leans  OT Frequency: Min 2X/week   Barriers to D/C: Decreased caregiver support  pt lives with son who is there at night,        Co-evaluation              AM-PAC  OT "6 Clicks" Daily Activity     Outcome Measure Help from another person eating meals?: A Little Help from another person taking care of personal grooming?: A Lot Help from another person toileting, which includes using toliet, bedpan, or urinal?: A Lot Help from another person bathing (including washing, rinsing, drying)?: A Lot Help from another person to put on and taking off regular upper body clothing?: A Lot Help from another person to put on and taking off regular lower body clothing?: A Lot 6 Click Score: 13   End of Session Equipment Utilized During Treatment: Gait belt;Rolling walker Nurse Communication: Mobility status  Activity Tolerance: Patient tolerated treatment well;Patient limited by pain Patient left: in bed;with call bell/phone within reach;with bed alarm set  OT Visit Diagnosis: Unsteadiness on feet (R26.81);Other abnormalities of gait and mobility (R26.89);Repeated falls (R29.6);Muscle weakness (generalized) (M62.81);History of falling (Z91.81);Other symptoms and signs involving cognitive function                Time: 1334-1414 OT Time Calculation (min): 40 min Charges:  OT General Charges $OT Visit: 1 Visit OT Evaluation $OT Eval Moderate Complexity: 1 Mod OT Treatments $Self Care/Home Management : 8-22 mins  Dorinda Hill OTR/L Acute Rehabilitation Services Office: [place] 11/20/2019, 3:54 PM

## 2019-11-20 NOTE — Progress Notes (Signed)
PROGRESS NOTE    Ronnie Moore  [id number] DOB: Jan 07, 1961 DOA: 11/16/2019 PCP: Patient, No Pcp Per  Brief Narrative: Ronnie Moore is a 58 y.o. 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 He presented to the ED with nausea, vomiting, left upper arm abscess. He underwent I&D of the abscess, was started on IV vancomycin. Was found to have elevated LFTs. CT abd/pelvis with suspicion for acute cholecystitis without evidence of gallstone.  Prominent mildly enlarged periceliac gastrohepatic ligament lymph nodes are noted.   RUQ ultrasound showed changes consistent with a calculus cholecystitis, CBD diameter 2 mm. Patient started on IV Zosyn.   Positive for acute hepatitis A. Surgery consulted, felt like CT and US findings were secondary to hepatitis. GI consulted and following.   Assessment & Plan:   Principal Problem:   Transaminitis Active Problems:   Essential hypertension   Abscess of left upper extremity   Acalculous cholecystitis   Acute hepatitis A virus infection   Transaminitis in setting of acute hepatitis A viral infection: Hepatitis A IgM is reactive.  Acute hepatitis is likely cause of his transaminitis.  Serum acetaminophen level is undetectable.  Will continue supportive care. -Continue IV fluid hydration -Hold Tylenol -Hep C negative--patient reports being treated for it in the past -GI consulted, appreciate further recommendations -I contacted infection control and requested that they report this case of hepatitis A to the Health Dept.  -liver enzymes improving but still elevated.   Acalculous cholecystitis ruled out: Without evidence of gallstones on CT or ultrasound imaging.  CBD diameter is 2 mm.  He has continued RUQ abdominal pain.  Lipase is elevated without pancreatic ductal dilatation  or inflammation on CT imaging. -Continue IV fluid resuscitation as above -Continue pain control and antiemetics as needed. His pain worsened on 12/13, changed IV morphine as needed to IV dilaudid as needed. Patient encouraged to alternated oxycodone and Dilaudid for pain control.  -Increased po oxycodone dose to match his home dose of '20mg'$  po q4 h prn for pain.    -Bilirubin remains elevated, repeat RUQ with findings again concerning for cholecystitis. Of note, General Surgery contacted again on 12/14, recommend medical management as findings in [place] are thought to still be secondary to acute hepatitis.  -Continue to advance diet as tolerated.  -Discontinued Zosyn since cholecystitis ruled out.   Neutropenia.  WBC trending up now. Likely due to viral infection.  -Repeat labs in am.   Abscess of left upper extremity: S/p I&D by EDP 11/16/2019.  DC vancomycin, start doxycycline.  Hypertension: Currently stable.  Chronic paraplegia secondary to spinal cord stroke: Chronic and unchanged from baseline. Needs PT evaluation.   DVT prophylaxis: SCDs Code Status: Full code, confirmed with patient Family Communication: Discussed with patient, updated his sister via phone call. Disposition Plan: Pending clinical progress, needs PT eval for disposition planning.  Consults called: GI, general surgery Admission status: Inpatient Procedures:  I&D of left upper arm on 12/11  Antimicrobials:  Vancomycin 12/11>>12/14 Zosyn 12/11>>12/14 Doxycycline 12/14>>   Subjective: His nausea is improving. Continues to have epigastric and RUQ pain but slightly improved. He notes that he has chronic back pain.   Objective: Vitals:   11/19/19 0536 11/19/19 1302 11/19/19 2024 11/20/19 0557  BP: 137/83 (!) 148/85 (!) 158/81 134/82  Pulse: (!) 59 (!) 59 67 65  Resp: 15 18  18 18  Temp: 97.6 F (36.4 C) 97.8 F (36.6 C) 98.6 F (37 C) 98.2 F (36.8 C)  TempSrc: Oral Oral Oral Oral  SpO2: 97% 96% 96%  96%  Weight:      Height:        Intake/Output Summary (Last 24 hours) at 11/20/2019 1121 Last data filed at 11/20/2019 0830 Gross per 24 hour  Intake 840 ml  Output 4000 ml  Net -3160 ml   Filed Weights   11/16/19 1200  Weight: 113.4 kg    Examination:  General exam: Appears calm and comfortable  Respiratory system: No respiratory distress.  Cardiovascular system: RRR. Gastrointestinal system: Abdomen is nondistended, soft, with RUQ and epigastric tenderness with no rebound tenderness.  Central nervous system: Alert and oriented x3.  Extremities: Upper extremity with nl strength, strength 1/5 bl lower extremities.  Skin: Several healing scabs in upper arms. Left arm with bandage that is c/d/i. Psychiatry: Guarded affect.     Data Reviewed: I have personally reviewed following labs and imaging studies  CBC: Recent Labs  Lab 11/16/19 1300 11/17/19 0616 11/18/19 0558 11/19/19 0505 11/20/19 0549  WBC 3.2* 2.8* 2.9* 3.4* 3.8*  NEUTROABS 1.3*  --  1.2*  --  1.7  HGB 15.6 14.4 14.2 14.2 14.9  HCT 48.1 44.3 43.3 43.3 45.3  MCV 91.4 90.6 89.6 89.1 90.1  PLT 131* 188 199 220 597   Basic Metabolic Panel: Recent Labs  Lab 11/17/19 0616 11/18/19 0558 11/19/19 0505 11/20/19 0549 11/20/19 0835  NA 137 138 137 135 137  K 3.7 3.8 3.8 5.6* 4.2  CL 104 105 104 103 104  CO2 '24 25 24 23 23  '$ GLUCOSE 126* 99 107* 112* 119*  BUN 7 5* <5* 6 6  CREATININE 0.83 0.67 0.68 0.63 0.65  CALCIUM 8.4* 8.6* 8.8* 8.4* 8.7*   GFR: Estimated Creatinine Clearance: 126 mL/min (by C-G formula based on SCr of 0.65 mg/dL). Liver Function Tests: Recent Labs  Lab 11/16/19 1300 11/17/19 0616 11/18/19 0558 11/19/19 0505 11/20/19 0549  AST 2,300* 1,680* 870* 535* 362*  ALT 1,756* 1,486* 1,096* 840* 675*  ALKPHOS 132* 107 101 108 120  BILITOT 3.9* 4.0* 4.2* 4.1* 4.6*  PROT 7.3 6.6 6.7 6.8 6.7  ALBUMIN 3.5 3.0* 2.9* 2.9* 2.9*   Recent Labs  Lab 11/16/19 1432 11/18/19 0558  LIPASE  198* 45   No results for input(s): AMMONIA in the last 168 hours. Coagulation Profile: Recent Labs  Lab 11/16/19 1551 11/18/19 1147 11/20/19 0549  INR 1.2 1.1 1.0   Cardiac Enzymes: Recent Labs  Lab 11/17/19 1743  CKTOTAL 52   BNP (last 3 results) No results for input(s): PROBNP in the last 8760 hours. HbA1C: No results for input(s): HGBA1C in the last 72 hours. CBG: No results for input(s): GLUCAP in the last 168 hours. Lipid Profile: No results for input(s): CHOL, HDL, LDLCALC, TRIG, CHOLHDL, LDLDIRECT in the last 72 hours. Thyroid Function Tests: No results for input(s): TSH, T4TOTAL, FREET4, T3FREE, THYROIDAB in the last 72 hours. Anemia Panel: No results for input(s): VITAMINB12, FOLATE, FERRITIN, TIBC, IRON, RETICCTPCT in the last 72 hours. Sepsis Labs: No results for input(s): PROCALCITON, LATICACIDVEN in the last 168 hours.  Recent Results (from the past 240 hour(s))  SARS CORONAVIRUS 2 (TAT 6-24 HRS) Nasopharyngeal Nasopharyngeal Swab     Status: None   Collection Time: 11/16/19  1:45 PM   Specimen: Nasopharyngeal Swab  Result Value Ref Range Status   SARS Coronavirus 2 NEGATIVE  NEGATIVE Final    Comment: (NOTE) SARS-CoV-2 target nucleic acids are NOT DETECTED. The SARS-CoV-2 RNA is generally detectable in upper and lower respiratory specimens during the acute phase of infection. Negative results do not preclude SARS-CoV-2 infection, do not rule out co-infections with other pathogens, and should not be used as the sole basis for treatment or other patient management decisions. Negative results must be combined with clinical observations, patient history, and epidemiological information. The expected result is Negative. Fact Sheet for Patients: SugarRoll.be Fact Sheet for Healthcare Providers: https://www.woods-mathews.com/ This test is not yet approved or cleared by the [place] FDA and  has been authorized for  detection and/or diagnosis of SARS-CoV-2 by FDA under an Emergency Use Authorization (EUA). This EUA will remain  in effect (meaning this test can be used) for the duration of the COVID-19 declaration under Section 56 4(b)(1) of the Act, 21 U.S.C. section 360bbb-3(b)(1), unless the authorization is terminated or revoked sooner. Performed at [facility], [place] [address], [place], [place] [address]   SARS Coronavirus 2 Ag (30 min TAT) - Nasal Swab (BD Veritor Kit)     Status: None   Collection Time: 11/16/19  3:51 PM   Specimen: Nasal Swab (BD Veritor Kit)  Result Value Ref Range Status   SARS Coronavirus 2 Ag NEGATIVE NEGATIVE Final    Comment: (NOTE) SARS-CoV-2 antigen NOT DETECTED.  Negative results are presumptive.  Negative results do not preclude SARS-CoV-2 infection and should not be used as the sole basis for treatment or other patient management decisions, including infection  control decisions, particularly in the presence of clinical signs and  symptoms consistent with COVID-19, or in those who have been in contact with the virus.  Negative results must be combined with clinical observations, patient history, and epidemiological information. The expected result is Negative. Fact Sheet for Patients: PodPark.tn Fact Sheet for Healthcare Providers: GiftContent.is This test is not yet approved or cleared by the [place] FDA and  has been authorized for detection and/or diagnosis of SARS-CoV-2 by FDA under an Emergency Use Authorization (EUA).  This EUA will remain in effect (meaning this test can be used) for the duration of  the COVID-19 de claration under Section 564(b)(1) of the Act, 21 U.S.C. section 360bbb-3(b)(1), unless the authorization is terminated or revoked sooner. Performed at [facility], [address] [place], [place] [address]   Culture, blood (routine x 2)     Status: None  (Preliminary result)   Collection Time: 11/16/19  8:15 PM   Specimen: Right Antecubital; Blood  Result Value Ref Range Status   Specimen Description   Final    RIGHT ANTECUBITAL Performed at [facility], [address] [place], [place] [address]    Special Requests   Final    BOTTLES DRAWN AEROBIC AND ANAEROBIC Blood Culture adequate volume Performed at [facility], [address] [place], [place] [address]    Culture   Final    NO GROWTH 3 DAYS Performed at [facility], [place] [address] [place], [place] [address]    Report Status PENDING  Incomplete  Culture, blood (routine x 2)     Status: None (Preliminary result)   Collection Time: 11/16/19  8:20 PM   Specimen: BLOOD RIGHT HAND  Result Value Ref Range Status   Specimen Description   Final    BLOOD RIGHT HAND Performed at [facility], [place],  [place], [place] [address]    Special Requests   Final    BOTTLES DRAWN AEROBIC AND ANAEROBIC Blood Culture results may not be optimal due to an inadequate volume of blood received in culture bottles Performed at [facility], [address] [place], [place] [address]    Culture   Final    NO GROWTH 3 DAYS Performed at [facility], [place] [address] [place], [place] [address]    Report Status PENDING  Incomplete  MRSA PCR Screening     Status: None   Collection Time: 11/17/19  1:39 AM   Specimen: Nasopharyngeal  Result Value Ref Range Status   MRSA by PCR NEGATIVE NEGATIVE Final    Comment:        The GeneXpert MRSA Assay (FDA approved for NASAL specimens only), is one component of a comprehensive MRSA colonization surveillance program. It is not intended to diagnose MRSA infection nor to guide or monitor treatment for MRSA infections. Performed at [facility], [place] [address], [place], [place] [address]          Radiology Studies: No results found.      Scheduled Meds: .  doxycycline  100 mg Oral Q12H  . loratadine  10 mg Oral Daily  . nicotine  14 mg Transdermal Daily  . sodium chloride flush  10-40 mL Intracatheter Q12H   Continuous Infusions: . sodium chloride 10 mL/hr at 11/18/19 0413     LOS: 4 days    Time spent: 25 minutes   Blain Pais, MD [facility]   If 7PM-7AM, please contact night-coverage [email or web address] Password [email or web address] 11/20/2019, 11:21 AM

## 2019-11-20 NOTE — Evaluation (Signed)
Physical Therapy Evaluation Patient Details Name: Ronnie Moore MRN: [id number] DOB: 1961/01/20 Today's Date: 11/20/2019   History of Present Illness  Ronnie Moore is a 58 y.o. 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  Clinical Impression   Pt presents with LE weakness bilaterally R>L, severe back pain with mobility, difficulty performing bed mobility/transfer, and decreased activity tolerance. Pt to benefit from acute PT to address deficits. Pt required max assist +2 for bed mobility and transfer to and from drop arm recliner, pt unable to perform stand pivot due to RLE buckling x2 with 2 stand attempts. PT recommending SNF level of care post-acutely to address deficits and maximize pt mobility prior to d/c home. Pt's home set up is not ideal, as pt with very small ramp to get into his house with a fall off this ramp in his power chair last week. Pt also has a very small bathroom with a tub shower, requires 3 transfers just to get into tub. PT to progress mobility as tolerated, and will continue to follow acutely.      Follow Up Recommendations SNF    Equipment Recommendations  Other (comment)(TBD, if pt refuses SNF may need new hospital bed)    Recommendations for Other Services       Precautions / Restrictions Precautions Precautions: Fall Restrictions Weight Bearing Restrictions: No      Mobility  Bed Mobility Overal bed mobility: Needs Assistance Bed Mobility: Rolling;Sidelying to Sit;Sit to Supine Rolling: Mod assist Sidelying to sit: Max assist;+2 for safety/equipment;HOB elevated   Sit to supine: Min assist   General bed mobility comments: modA for rolling in bed with verbal cuing for hand placement when rolling;maxA to progress trunk to upright position from sidelying to sit;minA to return BLE to  bed  Transfers Overall transfer level: Needs assistance Equipment used: 2 person hand held assist Transfers: Squat Pivot Transfers;Sit to/from Stand Sit to Stand: Total assist;+2 physical assistance;+2 safety/equipment   Squat pivot transfers: Max assist;+2 safety/equipment;+2 physical assistance     General transfer comment: maxA to transfer from EOB to drop arm recliner and transfer back to bed;unable to progress into standing in full upright posture;attempted 2x sit<>stand pt R knee buckled, required prompt return to sitting  Ambulation/Gait                Stairs            Wheelchair Mobility    Modified Rankin (Stroke Patients Only)       Balance Overall balance assessment: Needs assistance Sitting-balance support: Single extremity supported;Feet supported Sitting balance-Leahy Scale: Fair Sitting balance - Comments: minor LOB with initial sit EOB   Standing balance support: Bilateral upper extremity supported Standing balance-Leahy Scale: Zero Standing balance comment: unable to progress to upright position;maxA for squat piviot from EOB<>recliner                             Pertinent Vitals/Pain Pain Assessment: Faces Faces Pain Scale: Hurts whole lot Pain Location: back Pain Descriptors / Indicators: Grimacing;Guarding Pain Intervention(s): Limited activity within patient's tolerance;Monitored during session;Patient requesting pain meds-RN notified    Home Living Family/patient expects to be discharged to:: Private residence Living Arrangements: Children Available Help at Discharge: Family;Available PRN/intermittently Type of Home: Other(Comment)(duplex) Home Access: Ramped entrance  Home Layout: One level Home Equipment: Tub bench;Wheelchair - power;Hospital bed(tilt in space wheelchair) Additional Comments: fallen a couple times coming out of the shower    Prior Function Level of Independence: Needs assistance   Gait /  Transfers Assistance Needed: power wheelchair doesn't fit in bathroom;leaves chair at bathroom door and pivots around in bathroom to get to the shower;stand-pivot primarily for transfers  ADL's / Homemaking Assistance Needed: aide assists with bathing,   Comments: sleeps in hospital bed(reports current bed is not in good condition);aide is there every day from 10am to lunch time and returns in the afternoon;pt stays in chair until she returns      Hand Dominance   Dominant Hand: Right    Extremity/Trunk Assessment   Upper Extremity Assessment Upper Extremity Assessment: Defer to OT evaluation LUE Deficits / Details: surgical dressing in place, pain with movement;reports BUE feeling cold  LUE: Unable to fully assess due to pain LUE Coordination: WNL    Lower Extremity Assessment Lower Extremity Assessment: Generalized weakness(at least 3/5 bilateral knee extension and L hip flexion, 2/5 hip flexion R and PF/DF)    Cervical / Trunk Assessment Cervical / Trunk Assessment: Normal  Communication   Communication: No difficulties  Cognition Arousal/Alertness: Awake/alert Behavior During Therapy: WFL for tasks assessed/performed Overall Cognitive Status: No family/caregiver present to determine baseline cognitive functioning Area of Impairment: Attention;Memory;Following commands;Safety/judgement;Awareness;Problem solving                   Current Attention Level: Sustained Memory: Decreased recall of precautions;Decreased short-term memory Following Commands: Follows one step commands consistently;Follows one step commands with increased time;Follows multi-step commands with increased time Safety/Judgement: Decreased awareness of safety;Decreased awareness of deficits Awareness: Emergent Problem Solving: Slow processing;Decreased initiation;Difficulty sequencing;Requires verbal cues;Requires tactile cues General Comments: pt demonstrates limitations with processing  information, requires multimodal cuing for safe bed mobility and transfer      General Comments General comments (skin integrity, edema, etc.): vss    Exercises     Assessment/Plan    PT Assessment Patient needs continued PT services  PT Problem List Decreased strength;Decreased mobility;Decreased activity tolerance;Decreased balance;Decreased knowledge of use of DME;Pain       PT Treatment Interventions DME instruction;Therapeutic activities;Gait training;Therapeutic exercise;Patient/family education;Balance training;Functional mobility training    PT Goals (Current goals can be found in the Care Plan section)  Acute Rehab PT Goals Patient Stated Goal: to find a better housing situation PT Goal Formulation: With patient Time For Goal Achievement: 12/04/19 Potential to Achieve Goals: Good    Frequency Min 2X/week   Barriers to discharge Decreased caregiver support has an aide that comes in a few hours a day    Co-evaluation PT/OT/SLP Co-Evaluation/Treatment: Yes Reason for Co-Treatment: For patient/therapist safety;To address functional/ADL transfers PT goals addressed during session: Mobility/safety with mobility OT goals addressed during session: ADL's and self-care       AM-PAC PT "6 Clicks" Mobility  Outcome Measure Help needed turning from your back to your side while in a flat bed without using bedrails?: A Lot Help needed moving from lying on your back to sitting on the side of a flat bed without using bedrails?: Total Help needed moving to and from a bed to a chair (including a wheelchair)?: Total Help needed standing up from a chair using your arms (e.g., wheelchair or bedside chair)?: Total Help needed to walk in hospital room?: Total Help needed climbing 3-5 steps with a railing? : Total 6 Click Score: 7  End of Session Equipment Utilized During Treatment: Gait belt Activity Tolerance: Patient limited by fatigue;Patient limited by pain Patient left: in  bed;with bed alarm set;with call bell/phone within reach;with SCD's reapplied Nurse Communication: Mobility status PT Visit Diagnosis: Other abnormalities of gait and mobility (R26.89);Muscle weakness (generalized) (M62.81);Pain Pain - part of body: (back)    Time: [id number] PT Time Calculation (min) (ACUTE ONLY): 39 min   Charges:   PT Evaluation $PT Eval Low Complexity: 1 Low          Emmilyn Crooke E, PT Acute Rehabilitation Services Pager [phone]  Office [phone]  Jakson Delpilar D Elonda Husky 11/20/2019, 4:40 PM

## 2019-11-21 DIAGNOSIS — L02414 Cutaneous abscess of left upper limb: Secondary | ICD-10-CM

## 2019-11-21 DIAGNOSIS — K819 Cholecystitis, unspecified: Secondary | ICD-10-CM

## 2019-11-21 LAB — CULTURE, BLOOD (ROUTINE X 2)
Culture: NO GROWTH
Culture: NO GROWTH
Special Requests: ADEQUATE

## 2019-11-21 LAB — COMPREHENSIVE METABOLIC PANEL
ALT: 543 U/L — ABNORMAL HIGH (ref 0–44)
AST: 293 U/L — ABNORMAL HIGH (ref 15–41)
Albumin: 3 g/dL — ABNORMAL LOW (ref 3.5–5.0)
Alkaline Phosphatase: 142 U/L — ABNORMAL HIGH (ref 38–126)
Anion gap: 9 (ref 5–15)
BUN: 8 mg/dL (ref 6–20)
CO2: 24 mmol/L (ref 22–32)
Calcium: 8.7 mg/dL — ABNORMAL LOW (ref 8.9–10.3)
Chloride: 104 mmol/L (ref 98–111)
Creatinine, Ser: 0.67 mg/dL (ref 0.61–1.24)
GFR calc Af Amer: >60 mL/min (ref >60)
GFR calc non Af Amer: >60 mL/min (ref >60)
Glucose, Bld: 112 mg/dL — ABNORMAL HIGH (ref 70–99)
Potassium: 4.2 mmol/L (ref 3.5–5.1)
Sodium: 137 mmol/L (ref 135–145)
Total Bilirubin: 4.7 mg/dL — ABNORMAL HIGH (ref 0.3–1.2)
Total Protein: 7.3 g/dL (ref 6.5–8.1)

## 2019-11-21 LAB — CBC
HCT: 44 % (ref 39.0–52.0)
Hemoglobin: 14.5 g/dL (ref 13.0–17.0)
MCH: 29.2 pg (ref 26.0–34.0)
MCHC: 33 g/dL (ref 30.0–36.0)
MCV: 88.5 fL (ref 80.0–100.0)
Platelets: 290 10*3/uL (ref 150–400)
RBC: 4.97 MIL/uL (ref 4.22–5.81)
RDW: 15.1 % (ref 11.5–15.5)
WBC: 4.1 10*3/uL (ref 4.0–10.5)
nRBC: 0 % (ref 0.0–0.2)

## 2019-11-21 NOTE — NC FL2 (Signed)
[place] LEVEL OF CARE SCREENING TOOL     IDENTIFICATION  Patient Name: Ronnie Moore Birthdate: [birth year] Sex: male Admission Date (Current Location): 11/16/2019  [facility] and [place] Number:  Herbalist and Address:  [facility],  [place], [place]      Provider Number: [id number]  Attending Physician Name and Address:  Donne Hazel, MD  Relative Name and Phone Number:  Sister Matthias Hughs [phone]    Current Level of Care: Hospital Recommended Level of Care: [place] Prior Approval Number:    Date Approved/Denied:   PASRR Number: Pending  Discharge Plan: SNF    Current Diagnoses: Patient Active Problem List   Diagnosis Date Noted  . Transaminitis 11/16/2019  . Abscess of left upper extremity 11/16/2019  . Acalculous cholecystitis 11/16/2019  . Acute hepatitis A virus infection 11/16/2019  . Cellulitis 06/06/2016  . Cellulitis of right upper extremity 06/06/2016  . Hypokalemia 06/06/2016  . Essential hypertension 06/06/2016  . Abnormal LFTs 06/06/2016  . Chronic back pain 06/06/2016    Orientation RESPIRATION BLADDER Height & Weight     Self, Time, Situation, Place  Normal Incontinent Weight: 113.4 kg Height:  5\' 8"  (172.7 cm)  BEHAVIORAL SYMPTOMS/MOOD NEUROLOGICAL BOWEL NUTRITION STATUS      Continent Diet  AMBULATORY STATUS COMMUNICATION OF NEEDS Skin   Extensive Assist Verbally Surgical wounds, Skin abrasions                       Personal Care Assistance Level of Assistance  Bathing, Dressing Bathing Assistance: Limited assistance   Dressing Assistance: Limited assistance     Functional Limitations Info             SPECIAL CARE FACTORS FREQUENCY  PT (By licensed PT), OT (By licensed OT)     PT Frequency: 5 x weekly OT Frequency: 5 x weekly            Contractures      Additional Factors Info  Code Status, Allergies Code Status Info:  Full Allergies Info: Metaxalone           Current Medications (11/21/2019):  This is the current hospital active medication list Current Facility-Administered Medications  Medication Dose Route Frequency Provider Last Rate Last Admin  . 0.9 %  sodium chloride infusion   Intravenous PRN Blain Pais, MD 10 mL/hr at 11/18/19 0413 Restarted at 11/18/19 0413  . doxycycline (VIBRA-TABS) tablet 100 mg  100 mg Oral Q12H Adele Barthel D, MD   100 mg at 11/21/19 1005  . HYDROmorphone (DILAUDID) injection 1 mg  1 mg Intravenous Q3H PRN Rai, Ripudeep K, MD   1 mg at 11/21/19 1014  . loratadine (CLARITIN) tablet 10 mg  10 mg Oral Daily Adele Barthel D, MD   10 mg at 11/21/19 1005  . nicotine (NICODERM CQ - dosed in mg/24 hours) patch 14 mg  14 mg Transdermal Daily Adele Barthel D, MD   14 mg at 11/21/19 1005  . ondansetron (ZOFRAN) tablet 4 mg  4 mg Oral Q6H PRN Lenore Cordia, MD       Or  . ondansetron (ZOFRAN) injection 4 mg  4 mg Intravenous Q6H PRN Lenore Cordia, MD   4 mg at 11/17/19 1354  . oxyCODONE (Oxy IR/ROXICODONE) immediate release tablet 20 mg  20 mg Oral Q4H PRN Adele Barthel D, MD   20 mg at 11/21/19 1224  . senna-docusate (  Senokot-S) tablet 1 tablet  1 tablet Oral QHS PRN Zada Finders R, MD      . sodium chloride (OCEAN) 0.65 % nasal spray 1 spray  1 spray Each Nare PRN Adele Barthel D, MD      . sodium chloride flush (NS) 0.9 % injection 10-40 mL  10-40 mL Intracatheter Q12H Adele Barthel D, MD   10 mL at 11/21/19 1005  . sodium chloride flush (NS) 0.9 % injection 10-40 mL  10-40 mL Intracatheter PRN Blain Pais, MD         Discharge Medications: Please see discharge summary for a list of discharge medications.  Relevant Imaging Results:  Relevant Lab Results:   Additional Information [id number]  Asheley Hellberg, Marjie Skiff, RN

## 2019-11-21 NOTE — TOC Initial Note (Signed)
Transition of Care [facility]) - Initial/Assessment Note    Patient Details  Name: Ronnie Moore MRN: [id number] Date of Birth: 1961-01-19  Transition of [facility]) CM/SW Contact:    Emily Massar, Marjie Skiff, RN Phone Number: 11/21/2019, 1:40 PM  Clinical Narrative:                 Spoke with pt at bedside and sister Hilda Blades via phone about dc planning. PT recommendations for SNF discussed with pt and sister. Permission given for this CM to fax out FL2 to area facilities for bed offers. TOC will continue to follow.  Expected Discharge Plan: Skilled Nursing Facility Barriers to Discharge: Continued Medical Work up   Patient Goals and CMS Choice Patient states their goals for this hospitalization and ongoing recovery are:: To get better      Expected Discharge Plan and Services Expected Discharge Plan: [place]       Living arrangements for the past 2 months: Apartment                  Prior Living Arrangements/Services Living arrangements for the past 2 months: Apartment Lives with:: Self                   Activities of Daily Living Home Assistive Devices/Equipment: Wheelchair ADL Screening (condition at time of admission) Patient's cognitive ability adequate to safely complete daily activities?: Yes Is the patient deaf or have difficulty hearing?: No Does the patient have difficulty seeing, even when wearing glasses/contacts?: No Does the patient have difficulty concentrating, remembering, or making decisions?: No Patient able to express need for assistance with ADLs?: Yes Does the patient have difficulty dressing or bathing?: No Independently performs ADLs?: Yes (appropriate for developmental age) Does the patient have difficulty walking or climbing stairs?: Yes Weakness of Legs: Left Weakness of Arms/Hands: Left  Admission diagnosis:  Transaminitis [R74.01] Elevated LFTs [R79.89] Abscess of left arm [L02.414] Right upper quadrant abdominal pain  [R10.11] Multiple abrasions [T07.XXXA] Fall, initial encounter [id number]] Patient Active Problem List   Diagnosis Date Noted  . Transaminitis 11/16/2019  . Abscess of left upper extremity 11/16/2019  . Acalculous cholecystitis 11/16/2019  . Acute hepatitis A virus infection 11/16/2019  . Cellulitis 06/06/2016  . Cellulitis of right upper extremity 06/06/2016  . Hypokalemia 06/06/2016  . Essential hypertension 06/06/2016  . Abnormal LFTs 06/06/2016  . Chronic back pain 06/06/2016   PCP:  Patient, No Pcp Per Pharmacy:   [facility] [id number] - [place], [place] [address] Phone: [phone] Fax: [phone]     Social Determinants of Health (SDOH) Interventions    Readmission Risk Interventions No flowsheet data found.

## 2019-11-21 NOTE — Progress Notes (Signed)
PROGRESS NOTE    Ronnie Moore  QJF:[id number] DOB: 1960/12/20 DOA: 11/16/2019 PCP: Patient, No Pcp Per    Brief Narrative:  58 y.o.malewith medical history significant forparaplegia due to spinal cord stroke, wheelchair-bound, hypertension, and chronic back pain who presents to the ED for evaluation of an abscess to his left upper extremity. He presented to the ED with nausea, vomiting, left upper arm abscess. He underwent I&D of the abscess, was started on IV vancomycin. Was found to have elevated LFTs. CT abd/pelvis with suspicion for acute cholecystitis without evidence of gallstone. Prominent mildly enlarged periceliac gastrohepatic ligament lymph nodes are noted.  RUQ ultrasound showed changes consistent with a calculus cholecystitis, CBD diameter 2 mm. Patient started on IV Zosyn.   Positive for acute hepatitis A. Surgery consulted, felt like CT and US findings were secondary to hepatitis. GI consulted and following.   Assessment & Plan:   Principal Problem:   Transaminitis Active Problems:   Essential hypertension   Abscess of left upper extremity   Acalculous cholecystitis   Acute hepatitis A virus infection  Transaminitis in setting of acute hepatitis A viral infection: -Hepatitis A IgM is reactive.  -Acute hepatitis is likely cause of his transaminitis. Serum acetaminophen level is undetectable. Will continue supportive care. -Continue IV fluid hydration -Hold Tylenol -Hep C negative--patient reports being treated for it in the past -Appreciate input by GI. Recommendation for continued supportive care -I contacted infection control and requested that they report this case of hepatitis A to the Health Dept.  -liver enzymes improving but still elevated.   Acalculous cholecystitis ruled out: Without evidence of gallstones on CT or ultrasound imaging. CBD diameter is 2 mm. He has continued RUQ abdominal pain. Lipase is elevated without pancreatic ductal  dilatation or inflammation on CT imaging. -Continue IV fluid resuscitation as above -Continue pain control and antiemetics as needed. His pain worsened on 12/13, changed IV morphine as needed to IV dilaudid as needed. Patient encouraged to alternated oxycodone and Dilaudid for pain control.  -Increased po oxycodone dose to match his home dose of 75m po q4 h prn for pain.  -Bilirubin remains elevated, repeat RUQ with findings again concerning for cholecystitis. Of note, General Surgery contacted again on 12/14, [place] medical [place].  -Remains with marked abd pain. Will continue IV dilaudid as needed -Repeat cmp in AM  Neutropenia.  -WBC normalized. Likely due to viral infection.  -repeat cbc in AM  Abscess of left upper extremity: -S/p I&D by EDP 11/16/2019. DC vancomycin, start doxycycline.  -Stable at this time  Hypertension: -Presently stable  Chronic paraplegia secondary to spinal cord stroke: -Chronic and unchanged from baseline -Therapy recs for SNF noted  DVT prophylaxis: SCD's Code Status: Full Family Communication: Pt in room, family not at bedside Disposition Plan: Uncertain at this time  Consultants:   GI  General Surgery  Procedures:   I/D of LUE 12/11  Antimicrobials: Anti-infectives (From admission, onward)   Start     Dose/Rate Route Frequency Ordered Stop   11/19/19 2200  doxycycline (VIBRA-TABS) tablet 100 mg     100 mg Oral Every 12 hours 11/19/19 0933     11/17/19 0800  vancomycin (VANCOCIN) 1,500 mg in sodium chloride 0.9 % 500 mL IVPB  Status:  Discontinued     1,500 mg 250 mL/hr over 120 Minutes Intravenous Every 12 hours 11/16/19 1953 11/19/19 0932   11/17/19 0200  piperacillin-tazobactam (ZOSYN)  IVPB 3.375 g  Status:  Discontinued     3.375 g 12.5 mL/hr over 240 Minutes Intravenous Every 8 hours 11/16/19 1953 11/19/19 0932   11/16/19 2000   piperacillin-tazobactam (ZOSYN) IVPB 3.375 g     3.375 g 100 mL/hr over 30 Minutes Intravenous  Once 11/16/19 1953 11/16/19 2125   11/16/19 2000  vancomycin (VANCOCIN) IVPB 1000 mg/200 mL premix     1,000 mg 200 mL/hr over 60 Minutes Intravenous Every 1 hr x 2 11/16/19 1953 11/16/19 2226       Subjective: Still complaining of marked abd pain requiring IV dilaudid  Objective: Vitals:   11/20/19 1315 11/20/19 1954 11/21/19 0530 11/21/19 1353  BP: (!) 145/92 136/85 (!) 148/84 129/76  Pulse: 76 80 70 76  Resp: _0 Temp: 98.4 F (36.9 C) 98.7 F (37.1 C) 98.1 F (36.7 C) 98.3 F (36.8 C)  TempSrc: Oral Oral Oral Oral  SpO2: 100% 94% 98% 95%  Weight:      Height:        Intake/Output Summary (Last 24 hours) at 11/21/2019 1724 Last data filed at 11/21/2019 1423 Gross per 24 hour  Intake 1060 ml  Output 3335 ml  Net -2275 ml   Filed Weights   11/16/19 1200  Weight: 113.4 kg    Examination:  General exam: Appears calm and comfortable  Respiratory system: Clear to auscultation. Respiratory effort normal. Cardiovascular system: S1 & S2 heard, Regular Gastrointestinal system: Abdomen is nondistended, generalized abd pain. No organomegaly or masses felt. Normal bowel sounds heard. Central nervous system: Alert and oriented. No focal neurological deficits. Extremities: Symmetric 5 x 5 power. Skin: No rashes, lesions Psychiatry: Judgement and insight appear normal. Mood & affect appropriate.   Data Reviewed: I have personally reviewed following labs and imaging studies  CBC: Recent Labs  Lab 11/16/19 1300 11/17/19 0616 11/18/19 0558 11/19/19 0505 11/20/19 0549 11/21/19 0724  WBC 3.2* 2.8* 2.9* 3.4* 3.8* 4.1  NEUTROABS 1.3*  --  1.2*  --  1.7  --   HGB 15.6 14.4 14.2 14.2 14.9 14.5  HCT 48.1 44.3 43.3 43.3 45.3 44.0  MCV 91.4 90.6 89.6 89.1 90.1 88.5  PLT 131* 188 199 220 238 569   Basic Metabolic Panel: Recent Labs  Lab 11/18/19 0558 11/19/19 0505  11/20/19 0549 11/20/19 0835 11/21/19 0724  NA 138 137 135 137 137  K 3.8 3.8 5.6* 4.2 4.2  CL 105 104 103 104 104  CO2 _1 GLUCOSE 99 107* 112* 119* 112*  BUN 5* <5* _2 CREATININE 0.67 0.68 0.63 0.65 0.67  CALCIUM 8.6* 8.8* 8.4* 8.7* 8.7*   GFR: Estimated Creatinine Clearance: 126 mL/min (by C-G formula based on SCr of 0.67 mg/dL). Liver Function Tests: Recent Labs  Lab 11/17/19 0616 11/18/19 0558 11/19/19 0505 11/20/19 0549 11/21/19 0724  AST 1,680* 870* 535* 362* 293*  ALT 1,486* 1,096* 840* 675* 543*  ALKPHOS 107 101 108 120 142*  BILITOT 4.0* 4.2* 4.1* 4.6* 4.7*  PROT 6.6 6.7 6.8 6.7 7.3  ALBUMIN 3.0* 2.9* 2.9* 2.9* 3.0*   Recent Labs  Lab 11/16/19 1432 11/18/19 0558  LIPASE 198* 45   No results for input(s): AMMONIA in the last 168 hours. Coagulation Profile: Recent Labs  Lab 11/16/19 1551 11/18/19 1147 11/20/19 0549  INR 1.2 1.1 1.0   Cardiac Enzymes: Recent Labs  Lab 11/17/19 1743  CKTOTAL 52   BNP (last 3 results) No results for  input(s): PROBNP in the last 8760 hours. HbA1C: No results for input(s): HGBA1C in the last 72 hours. CBG: No results for input(s): GLUCAP in the last 168 hours. Lipid Profile: No results for input(s): CHOL, HDL, LDLCALC, TRIG, CHOLHDL, LDLDIRECT in the last 72 hours. Thyroid Function Tests: No results for input(s): TSH, T4TOTAL, FREET4, T3FREE, THYROIDAB in the last 72 hours. Anemia Panel: No results for input(s): VITAMINB12, FOLATE, FERRITIN, TIBC, IRON, RETICCTPCT in the last 72 hours. Sepsis Labs: No results for input(s): PROCALCITON, LATICACIDVEN in the last 168 hours.  Recent Results (from the past 240 hour(s))  SARS CORONAVIRUS 2 (TAT 6-24 HRS) Nasopharyngeal Nasopharyngeal Swab     Status: None   Collection Time: 11/16/19  1:45 PM   Specimen: Nasopharyngeal Swab  Result Value Ref Range Status   SARS Coronavirus 2 NEGATIVE NEGATIVE Final    Comment: (NOTE) SARS-CoV-2 target nucleic acids  are NOT DETECTED. The SARS-CoV-2 RNA is generally detectable in upper and lower respiratory specimens during the acute phase of infection. Negative results do not preclude SARS-CoV-2 infection, do not rule out co-infections with other pathogens, and should not be used as the sole basis for treatment or other patient management decisions. Negative results must be combined with clinical observations, patient history, and epidemiological information. The expected result is Negative. Fact Sheet for Patients: SugarRoll.be Fact Sheet for Healthcare Providers: https://www.woods-mathews.com/ This test is not yet approved or cleared by the [place] FDA and  has been authorized for detection and/or diagnosis of SARS-CoV-2 by FDA under an Emergency Use Authorization (EUA). This EUA will remain  in effect (meaning this test can be used) for the duration of the COVID-19 declaration under Section 56 4(b)(1) of the Act, 21 U.S.C. section 360bbb-3(b)(1), unless the authorization is terminated or revoked sooner. Performed at [facility], [place] [address], [place], [place] [address]   SARS Coronavirus 2 Ag (30 min TAT) - Nasal Swab (BD Veritor Kit)     Status: None   Collection Time: 11/16/19  3:51 PM   Specimen: Nasal Swab (BD Veritor Kit)  Result Value Ref Range Status   SARS Coronavirus 2 Ag NEGATIVE NEGATIVE Final    Comment: (NOTE) SARS-CoV-2 antigen NOT DETECTED.  Negative results are presumptive.  Negative results do not preclude SARS-CoV-2 infection and should not be used as the sole basis for treatment or other patient management decisions, including infection  control decisions, particularly in the presence of clinical signs and  symptoms consistent with COVID-19, or in those who have been in contact with the virus.  Negative results must be combined with clinical observations, patient history, and epidemiological information. The  expected result is Negative. Fact Sheet for Patients: PodPark.tn Fact Sheet for Healthcare Providers: GiftContent.is This test is not yet approved or cleared by the [place] FDA and  has been authorized for detection and/or diagnosis of SARS-CoV-2 by FDA under an Emergency Use Authorization (EUA).  This EUA will remain in effect (meaning this test can be used) for the duration of  the COVID-19 de claration under Section 564(b)(1) of the Act, 21 U.S.C. section 360bbb-3(b)(1), unless the authorization is terminated or revoked sooner. Performed at [facility], [address] [place], [place] [address]   Culture, blood (routine x 2)     Status: None   Collection Time: 11/16/19  8:15 PM   Specimen: Right Antecubital; Blood  Result Value Ref Range Status   Specimen Description   Final    RIGHT ANTECUBITAL  Performed at [facility] [address] [place], [place] [address]    Special Requests   Final    BOTTLES DRAWN AEROBIC AND ANAEROBIC Blood Culture adequate volume Performed at [facility], [address] [place], [place] [address]    Culture   Final    NO GROWTH 5 DAYS Performed at [facility], [place] [address] [place], [place] [address]    Report Status 11/21/2019 FINAL  Final  Culture, blood (routine x 2)     Status: None   Collection Time: 11/16/19  8:20 PM   Specimen: BLOOD RIGHT HAND  Result Value Ref Range Status   Specimen Description   Final    BLOOD RIGHT HAND Performed at [facility], [address] [place], [place] [address]    Special Requests   Final    BOTTLES DRAWN AEROBIC AND ANAEROBIC Blood Culture results may not be optimal due to an inadequate volume of blood received in culture bottles Performed at [facility], [address] [place], [place] [address]    Culture   Final    NO GROWTH 5 DAYS Performed at [place], [place] [address] [place], [place] [address]    Report Status 11/21/2019 FINAL  Final  MRSA PCR Screening     Status: None   Collection Time: 11/17/19  1:39 AM   Specimen: Nasopharyngeal  Result Value Ref Range Status   MRSA by PCR NEGATIVE NEGATIVE Final    Comment:        The GeneXpert MRSA Assay (FDA approved for NASAL specimens only), is one component of a comprehensive MRSA colonization surveillance program. It is not intended to diagnose MRSA infection nor to guide or monitor treatment for MRSA infections. Performed at [facility], [place] [address] [place], [place] [address]      Radiology Studies: No results found.  Scheduled Meds: . doxycycline  100 mg Oral Q12H  . loratadine  10 mg Oral Daily  . nicotine  14 mg Transdermal Daily  . sodium chloride flush  10-40 mL Intracatheter Q12H   Continuous Infusions: . sodium chloride 10 mL/hr at 11/18/19 0413     LOS: 5 days   Marylu Lund, MD [facility] Pager On Amion  If 7PM-7AM, please contact night-coverage 11/21/2019, 5:24 PM

## 2019-11-22 DIAGNOSIS — G4733 Obstructive sleep apnea (adult) (pediatric): Secondary | ICD-10-CM | POA: Diagnosis not present

## 2019-11-22 DIAGNOSIS — E785 Hyperlipidemia, unspecified: Secondary | ICD-10-CM | POA: Diagnosis not present

## 2019-11-22 DIAGNOSIS — Z8673 Personal history of transient ischemic attack (TIA), and cerebral infarction without residual deficits: Secondary | ICD-10-CM | POA: Diagnosis not present

## 2019-11-22 DIAGNOSIS — R7303 Prediabetes: Secondary | ICD-10-CM | POA: Diagnosis not present

## 2019-11-22 DIAGNOSIS — I10 Essential (primary) hypertension: Secondary | ICD-10-CM | POA: Diagnosis not present

## 2019-11-22 LAB — COMPREHENSIVE METABOLIC PANEL
ALT: 446 U/L — ABNORMAL HIGH (ref 0–44)
AST: 219 U/L — ABNORMAL HIGH (ref 15–41)
Albumin: 3 g/dL — ABNORMAL LOW (ref 3.5–5.0)
Alkaline Phosphatase: 150 U/L — ABNORMAL HIGH (ref 38–126)
Anion gap: 10 (ref 5–15)
BUN: 9 mg/dL (ref 6–20)
CO2: 24 mmol/L (ref 22–32)
Calcium: 8.8 mg/dL — ABNORMAL LOW (ref 8.9–10.3)
Chloride: 103 mmol/L (ref 98–111)
Creatinine, Ser: 0.6 mg/dL — ABNORMAL LOW (ref 0.61–1.24)
GFR calc Af Amer: >60 mL/min (ref >60)
GFR calc non Af Amer: >60 mL/min (ref >60)
Glucose, Bld: 104 mg/dL — ABNORMAL HIGH (ref 70–99)
Potassium: 4.3 mmol/L (ref 3.5–5.1)
Sodium: 137 mmol/L (ref 135–145)
Total Bilirubin: 4.8 mg/dL — ABNORMAL HIGH (ref 0.3–1.2)
Total Protein: 7.5 g/dL (ref 6.5–8.1)

## 2019-11-22 MED ORDER — HYDROMORPHONE HCL 1 MG/ML IJ SOLN
1.0000 mg | INTRAMUSCULAR | Status: DC | PRN
  Administered 2019-11-22 – 2019-11-23 (×5): 1 mg via INTRAVENOUS
  Filled 2019-11-22 (×5): qty 1

## 2019-11-22 NOTE — Care Management Important Message (Signed)
Important Message  Patient Details IM Letter given to Marney Doctor RN Case Manager to present to the Patient Name: METRO PICKETT MRN: [id number] Date of Birth: [birth year]   Medicare Important Message Given:  Yes     Kerin Salen 11/22/2019, 10:09 AM

## 2019-11-22 NOTE — Progress Notes (Signed)
PROGRESS NOTE    Ronnie Moore  HQR:[id number] DOB: 1961/08/25 DOA: 11/16/2019 PCP: Patient, No Pcp Per    Brief Narrative:  58 y.o.malewith medical history significant forparaplegia due to spinal cord stroke, wheelchair-bound, hypertension, and chronic back pain who presents to the ED for evaluation of an abscess to his left upper extremity. He presented to the ED with nausea, vomiting, left upper arm abscess. He underwent I&D of the abscess, was started on IV vancomycin. Was found to have elevated LFTs. CT abd/pelvis with suspicion for acute cholecystitis without evidence of gallstone. Prominent mildly enlarged periceliac gastrohepatic ligament lymph nodes are noted.  RUQ ultrasound showed changes consistent with a calculus cholecystitis, CBD diameter 2 mm. Patient started on IV Zosyn.   Positive for acute hepatitis A. Surgery consulted, felt like CT and US findings were secondary to hepatitis. GI consulted and following.   Assessment & Plan:   Principal Problem:   Transaminitis Active Problems:   Essential hypertension   Abscess of left upper extremity   Acalculous cholecystitis   Acute hepatitis A virus infection  Transaminitis in setting of acute hepatitis A viral infection: -Hepatitis A IgM is reactive.  -Acute hepatitis is likely cause of his transaminitis. Serum acetaminophen level is undetectable. Will continue supportive care. -Continue IV fluid hydration -Hold Tylenol -Hep C negative--patient reports being treated for it in the past -Appreciate input by GI. Recommendation for continued supportive care -Infection control had requested that they report this case of hepatitis A to the Health Dept.  -liver enzymes continuing to improve but still elevated.  -Repeat LFT in AM  Acalculous cholecystitis ruled out: Without evidence of gallstones on CT or ultrasound imaging. CBD diameter is 2 mm. He has continued RUQ abdominal pain. Lipase is elevated without  pancreatic ductal dilatation or inflammation on CT imaging. -Continue IV fluid resuscitation as above -Continue pain control and antiemetics as needed. His pain worsened on 12/13, changed IV morphine as needed to IV dilaudid as needed. Patient encouraged to alternated oxycodone and Dilaudid for pain control.  -Increased po oxycodone dose to match his home dose of 27m po q4 h prn for pain.  -Bilirubin remains elevated, repeat RUQ with findings again concerning for cholecystitis. Of note, General Surgery contacted again on 12/14, [place]  -Remains with marked abd pain, will begin weaning IV narcotic as tolerated -Repeat cmp in AM  Neutropenia.  -WBC normalized. Likely due to viral infection.  -recheck CBC in AM  Abscess of left upper extremity: -S/p I&D by EDP 11/16/2019. DC vancomycin, start doxycycline.  -Stable at this time  Hypertension: -Currently stable  Chronic paraplegia secondary to spinal cord stroke: -Chronic and unchanged from baseline -Therapy recs for SNF noted  DVT prophylaxis: SCD's Code Status: Full Family Communication: Pt in room, family not at bedside Disposition Plan: Uncertain at this time  Consultants:   GI  General Surgery  Procedures:   I/D of LUE 12/11  Antimicrobials: Anti-infectives (From admission, onward)   Start     Dose/Rate Route Frequency Ordered Stop   11/19/19 2200  doxycycline (VIBRA-TABS) tablet 100 mg     100 mg Oral Every 12 hours 11/19/19 0933     11/17/19 0800  vancomycin (VANCOCIN) 1,500 mg in sodium chloride 0.9 % 500 mL IVPB  Status:  Discontinued     1,500 mg 250 mL/hr over 120 Minutes Intravenous Every 12 hours 11/16/19 1953 11/19/19 0932  11/17/19 0200  piperacillin-tazobactam (ZOSYN) IVPB 3.375 g  Status:  Discontinued     3.375 g 12.5 mL/hr over 240 Minutes Intravenous Every 8 hours 11/16/19 1953 11/19/19 0932   11/16/19  2000  piperacillin-tazobactam (ZOSYN) IVPB 3.375 g     3.375 g 100 mL/hr over 30 Minutes Intravenous  Once 11/16/19 1953 11/16/19 2125   11/16/19 2000  vancomycin (VANCOCIN) IVPB 1000 mg/200 mL premix     1,000 mg 200 mL/hr over 60 Minutes Intravenous Every 1 hr x 2 11/16/19 1953 11/16/19 2226      Subjective: Complaining of continued abd pain this AM  Objective: Vitals:   11/21/19 1353 11/21/19 2127 11/22/19 0527 11/22/19 1341  BP: 129/76 136/77 128/87 129/78  Pulse: 76 77 73 79  Resp: _0 Temp: 98.3 F (36.8 C) 97.7 F (36.5 C) 98.2 F (36.8 C) 98.2 F (36.8 C)  TempSrc: Oral Oral Oral Oral  SpO2: 95% 98% 98% 99%  Weight:      Height:        Intake/Output Summary (Last 24 hours) at 11/22/2019 1742 Last data filed at 11/22/2019 1624 Gross per 24 hour  Intake 240 ml  Output 3850 ml  Net -3610 ml   Filed Weights   11/16/19 1200  Weight: 113.4 kg    Examination: General exam: Awake, laying in bed, in nad Respiratory system: Normal respiratory effort, no wheezing Cardiovascular system: regular rate, s1, s2 Gastrointestinal system: Soft, nondistended, positive BS, upper quadrant tenderness Central nervous system: CN2-12 grossly intact, strength intact Extremities: Perfused, no clubbing Skin: Normal skin turgor, no notable skin lesions seen Psychiatry: Mood normal // no visual hallucinations   Data Reviewed: I have personally reviewed following labs and imaging studies  CBC: Recent Labs  Lab 11/16/19 1300 11/17/19 0616 11/18/19 0558 11/19/19 0505 11/20/19 0549 11/21/19 0724  WBC 3.2* 2.8* 2.9* 3.4* 3.8* 4.1  NEUTROABS 1.3*  --  1.2*  --  1.7  --   HGB 15.6 14.4 14.2 14.2 14.9 14.5  HCT 48.1 44.3 43.3 43.3 45.3 44.0  MCV 91.4 90.6 89.6 89.1 90.1 88.5  PLT 131* 188 199 220 238 160   Basic Metabolic Panel: Recent Labs  Lab 11/19/19 0505 11/20/19 0549 11/20/19 0835 11/21/19 0724 11/22/19 0638  NA 137 135 137 137 137  K 3.8 5.6* 4.2 4.2 4.3   CL 104 103 104 104 103  CO2 _1 GLUCOSE 107* 112* 119* 112* 104*  BUN <5* _2 CREATININE 0.68 0.63 0.65 0.67 0.60*  CALCIUM 8.8* 8.4* 8.7* 8.7* 8.8*   GFR: Estimated Creatinine Clearance: 126 mL/min (A) (by C-G formula based on SCr of 0.6 mg/dL (L)). Liver Function Tests: Recent Labs  Lab 11/18/19 0558 11/19/19 0505 11/20/19 0549 11/21/19 0724 11/22/19 0638  AST 870* 535* 362* 293* 219*  ALT 1,096* 840* 675* 543* 446*  ALKPHOS 101 108 120 142* 150*  BILITOT 4.2* 4.1* 4.6* 4.7* 4.8*  PROT 6.7 6.8 6.7 7.3 7.5  ALBUMIN 2.9* 2.9* 2.9* 3.0* 3.0*   Recent Labs  Lab 11/16/19 1432 11/18/19 0558  LIPASE 198* 45   No results for input(s): AMMONIA in the last 168 hours. Coagulation Profile: Recent Labs  Lab 11/16/19 1551 11/18/19 1147 11/20/19 0549  INR 1.2 1.1 1.0   Cardiac Enzymes: Recent Labs  Lab 11/17/19 1743  CKTOTAL 52   BNP (last 3 results) No results for input(s): PROBNP in the last 8760 hours. HbA1C: No results  for input(s): HGBA1C in the last 72 hours. CBG: No results for input(s): GLUCAP in the last 168 hours. Lipid Profile: No results for input(s): CHOL, HDL, LDLCALC, TRIG, CHOLHDL, LDLDIRECT in the last 72 hours. Thyroid Function Tests: No results for input(s): TSH, T4TOTAL, FREET4, T3FREE, THYROIDAB in the last 72 hours. Anemia Panel: No results for input(s): VITAMINB12, FOLATE, FERRITIN, TIBC, IRON, RETICCTPCT in the last 72 hours. Sepsis Labs: No results for input(s): PROCALCITON, LATICACIDVEN in the last 168 hours.  Recent Results (from the past 240 hour(s))  SARS CORONAVIRUS 2 (TAT 6-24 HRS) Nasopharyngeal Nasopharyngeal Swab     Status: None   Collection Time: 11/16/19  1:45 PM   Specimen: Nasopharyngeal Swab  Result Value Ref Range Status   SARS Coronavirus 2 NEGATIVE NEGATIVE Final    Comment: (NOTE) SARS-CoV-2 target nucleic acids are NOT DETECTED. The SARS-CoV-2 RNA is generally detectable in upper and  lower respiratory specimens during the acute phase of infection. Negative results do not preclude SARS-CoV-2 infection, do not rule out co-infections with other pathogens, and should not be used as the sole basis for treatment or other patient management decisions. Negative results must be combined with clinical observations, patient history, and epidemiological information. The expected result is Negative. Fact Sheet for Patients: SugarRoll.be Fact Sheet for Healthcare Providers: https://www.woods-mathews.com/ This test is not yet approved or cleared by the [place] FDA and  has been authorized for detection and/or diagnosis of SARS-CoV-2 by FDA under an Emergency Use Authorization (EUA). This EUA will remain  in effect (meaning this test can be used) for the duration of the COVID-19 declaration under Section 56 4(b)(1) of the Act, 21 U.S.C. section 360bbb-3(b)(1), unless the authorization is terminated or revoked sooner. Performed at [facility], [place] [address] [place], [place] [address]   SARS Coronavirus 2 Ag (30 min TAT) - Nasal Swab (BD Veritor Kit)     Status: None   Collection Time: 11/16/19  3:51 PM   Specimen: Nasal Swab (BD Veritor Kit)  Result Value Ref Range Status   SARS Coronavirus 2 Ag NEGATIVE NEGATIVE Final    Comment: (NOTE) SARS-CoV-2 antigen NOT DETECTED.  Negative results are presumptive.  Negative results do not preclude SARS-CoV-2 infection and should not be used as the sole basis for treatment or other patient management decisions, including infection  control decisions, particularly in the presence of clinical signs and  symptoms consistent with COVID-19, or in those who have been in contact with the virus.  Negative results must be combined with clinical observations, patient history, and epidemiological information. The expected result is Negative. Fact Sheet for Patients:  PodPark.tn Fact Sheet for Healthcare Providers: GiftContent.is This test is not yet approved or cleared by the [place] FDA and  has been authorized for detection and/or diagnosis of SARS-CoV-2 by FDA under an Emergency Use Authorization (EUA).  This EUA will remain in effect (meaning this test can be used) for the duration of  the COVID-19 de claration under Section 564(b)(1) of the Act, 21 U.S.C. section 360bbb-3(b)(1), unless the authorization is terminated or revoked sooner. Performed at [facility], [address] [place], [place] [address]   Culture, blood (routine x 2)     Status: None   Collection Time: 11/16/19  8:15 PM   Specimen: Right Antecubital; Blood  Result Value Ref Range Status   Specimen Description   Final    RIGHT ANTECUBITAL Performed at [facility], [place],  [place], [place] [address]    Special Requests   Final    BOTTLES DRAWN AEROBIC AND ANAEROBIC Blood Culture adequate volume Performed at [facility], [address] [place], [place] [address]    Culture   Final    NO GROWTH 5 DAYS Performed at [facility], [place] [address], [place], [place] [address]    Report Status 11/21/2019 FINAL  Final  Culture, blood (routine x 2)     Status: None   Collection Time: 11/16/19  8:20 PM   Specimen: BLOOD RIGHT HAND  Result Value Ref Range Status   Specimen Description   Final    BLOOD RIGHT HAND Performed at [facility], [address] [place], [place] [address]    Special Requests   Final    BOTTLES DRAWN AEROBIC AND ANAEROBIC Blood Culture results may not be optimal due to an inadequate volume of blood received in culture bottles Performed at [facility], [address] [place], [place] [address]    Culture   Final    NO GROWTH 5 DAYS Performed at [facility], [place] [address] [place], [place] [address]    Report  Status 11/21/2019 FINAL  Final  MRSA PCR Screening     Status: None   Collection Time: 11/17/19  1:39 AM   Specimen: Nasopharyngeal  Result Value Ref Range Status   MRSA by PCR NEGATIVE NEGATIVE Final    Comment:        The GeneXpert MRSA Assay (FDA approved for NASAL specimens only), is one component of a comprehensive MRSA colonization surveillance program. It is not intended to diagnose MRSA infection nor to guide or monitor treatment for MRSA infections. Performed at [facility], [place] [address] [place],  [address]      Radiology Studies: No results found.  Scheduled Meds: . doxycycline  100 mg Oral Q12H  . loratadine  10 mg Oral Daily  . nicotine  14 mg Transdermal Daily  . sodium chloride flush  10-40 mL Intracatheter Q12H   Continuous Infusions: . sodium chloride 10 mL/hr at 11/18/19 0413     LOS: 6 days   Marylu Lund, MD [facility] Pager On Amion  If 7PM-7AM, please contact night-coverage 11/22/2019, 5:42 PM

## 2019-11-23 LAB — COMPREHENSIVE METABOLIC PANEL
ALT: 327 U/L — ABNORMAL HIGH (ref 0–44)
AST: 150 U/L — ABNORMAL HIGH (ref 15–41)
Albumin: 3.1 g/dL — ABNORMAL LOW (ref 3.5–5.0)
Alkaline Phosphatase: 146 U/L — ABNORMAL HIGH (ref 38–126)
Anion gap: 7 (ref 5–15)
BUN: 10 mg/dL (ref 6–20)
CO2: 25 mmol/L (ref 22–32)
Calcium: 8.2 mg/dL — ABNORMAL LOW (ref 8.9–10.3)
Chloride: 100 mmol/L (ref 98–111)
Creatinine, Ser: 0.62 mg/dL (ref 0.61–1.24)
GFR calc Af Amer: >60 mL/min (ref >60)
GFR calc non Af Amer: >60 mL/min (ref >60)
Glucose, Bld: 112 mg/dL — ABNORMAL HIGH (ref 70–99)
Potassium: 4.2 mmol/L (ref 3.5–5.1)
Sodium: 132 mmol/L — ABNORMAL LOW (ref 135–145)
Total Bilirubin: 4.5 mg/dL — ABNORMAL HIGH (ref 0.3–1.2)
Total Protein: 7.3 g/dL (ref 6.5–8.1)

## 2019-11-23 LAB — CBC
HCT: 41.3 % (ref 39.0–52.0)
Hemoglobin: 13.6 g/dL (ref 13.0–17.0)
MCH: 29.2 pg (ref 26.0–34.0)
MCHC: 32.9 g/dL (ref 30.0–36.0)
MCV: 88.6 fL (ref 80.0–100.0)
Platelets: 317 10*3/uL (ref 150–400)
RBC: 4.66 MIL/uL (ref 4.22–5.81)
RDW: 15.9 % — ABNORMAL HIGH (ref 11.5–15.5)
WBC: 5.4 10*3/uL (ref 4.0–10.5)
nRBC: 0 % (ref 0.0–0.2)

## 2019-11-23 MED ORDER — HYDROMORPHONE HCL 1 MG/ML IJ SOLN
1.0000 mg | Freq: Four times a day (QID) | INTRAMUSCULAR | Status: DC | PRN
  Administered 2019-11-23 – 2019-11-26 (×10): 1 mg via INTRAVENOUS
  Filled 2019-11-23 (×10): qty 1

## 2019-11-23 MED ORDER — PANTOPRAZOLE SODIUM 40 MG PO TBEC
40.0000 mg | DELAYED_RELEASE_TABLET | Freq: Every day | ORAL | Status: DC
  Administered 2019-11-23 – 2019-11-28 (×6): 40 mg via ORAL
  Filled 2019-11-23 (×6): qty 1

## 2019-11-23 MED ORDER — CYCLOBENZAPRINE HCL 5 MG PO TABS
5.0000 mg | ORAL_TABLET | Freq: Three times a day (TID) | ORAL | Status: DC | PRN
  Administered 2019-11-23 – 2019-11-24 (×3): 5 mg via ORAL
  Filled 2019-11-23 (×3): qty 1

## 2019-11-23 NOTE — Progress Notes (Signed)
PT Cancellation Note  Patient Details Name: Ronnie Moore MRN: [id number] DOB: [birth year]   Cancelled Treatment:    Reason Eval/Treat Not Completed: Pain limiting ability to participate - Pt reports he is in too much back pain to participate in PT today. PT to check back as schedule allows.   [facility] Pager [phone]  Office [phone]     [place] 11/23/2019, 11:59 AM

## 2019-11-23 NOTE — TOC Progression Note (Addendum)
Transition of Care [facility]) - Progression Note    Patient Details  Name: Ronnie Moore MRN: [id number] Date of Birth: 1961/05/01  Transition of [facility] CM/SW Contact  Aubriee Szeto, Marjie Skiff, RN Phone Number: 11/23/2019, 1:56 PM  Clinical Narrative:    [facility] is pending. Additional info sent to [facility]. [facility] however they are not admitting due to covid right now. Auth started with [facility]. Sister given bed offers and will call weekend CSW phone to give snf choice. [place] will need to be updated with SNF choice and faxed updated therapy notes.  Expected Discharge Plan: [place] Barriers to Discharge: Continued Medical Work up  Expected Discharge Plan and Services Expected Discharge Plan: [place] arrangements for the past 2 months: Apartment                    Readmission Risk Interventions No flowsheet data found.

## 2019-11-23 NOTE — Progress Notes (Signed)
PROGRESS NOTE    RICHY SPRADLEY  DHR:[id number] DOB: 01-13-1961 DOA: 11/16/2019 PCP: Patient, No Pcp Per    Brief Narrative:  58 y.o.malewith medical history significant forparaplegia due to spinal cord stroke, wheelchair-bound, hypertension, and chronic back pain who presents to the ED for evaluation of an abscess to his left upper extremity. He presented to the ED with nausea, vomiting, left upper arm abscess. He underwent I&D of the abscess, was started on IV vancomycin. Was found to have elevated LFTs. CT abd/pelvis with suspicion for acute cholecystitis without evidence of gallstone. Prominent mildly enlarged periceliac gastrohepatic ligament lymph nodes are noted.  RUQ ultrasound showed changes consistent with a calculus cholecystitis, CBD diameter 2 mm. Patient started on IV Zosyn.   Positive for acute hepatitis A. Surgery consulted, felt like CT and US findings were secondary to hepatitis. GI consulted and following.   Assessment & Plan:   Principal Problem:   Transaminitis Active Problems:   Essential hypertension   Abscess of left upper extremity   Acalculous cholecystitis   Acute hepatitis A virus infection  Transaminitis in setting of acute hepatitis A viral infection: -Hepatitis A IgM is reactive.  -Acute hepatitis is likely cause of his transaminitis. Serum acetaminophen level is undetectable. Will continue supportive care. -Continue IV fluid hydration -Hold Tylenol -Hep C negative--patient reports being treated for it in the past -Appreciate input by GI. Recommendation for continued supportive care -Infection control had requested that they report this case of hepatitis A to the Health Dept.  -liver enzymes continuing to improve but still elevated.   Acalculous cholecystitis ruled out: Without evidence of gallstones on CT or ultrasound imaging. CBD diameter is 2 mm. He has continued RUQ abdominal pain. Lipase is elevated without pancreatic ductal  dilatation or inflammation on CT imaging. -Continue IV fluid resuscitation as above -Continue pain control and antiemetics as needed. His pain worsened on 12/13, changed IV morphine as needed to IV dilaudid as needed. Patient encouraged to alternated oxycodone and Dilaudid for pain control.  -Increased po oxycodone dose to match his home dose of 62m po q4 h prn for pain.  -Bilirubin remains elevated, repeat RUQ with findings again concerning for cholecystitis. Of note, General Surgery contacted again on 12/14, [place] medical management [place].  -Continuing requiring IV narcotic, however, primary pain seems to be chronic back pain now. Weaning IV dilaudid -Of note, [facility] reviewed, pt had been prescribed suboxone this past fall -Wean narcotics  Neutropenia.  -WBC normalized. Likely due to viral infection.  -repeat CBC in AM  Abscess of left upper extremity: -S/p I&D by EDP 11/16/2019. DC vancomycin, start doxycycline.  -Stable at this time  Hypertension: -Currently stable  Chronic paraplegia secondary to spinal cord stroke: -Chronic and unchanged from baseline -Therapy recs for SNF noted  DVT prophylaxis: SCD's Code Status: Full Family Communication: Pt in room, family not at bedside Disposition Plan: Uncertain at this time  Consultants:   GI  General Surgery  Procedures:   I/D of LUE 12/11  Antimicrobials: Anti-infectives (From admission, onward)   Start     Dose/Rate Route Frequency Ordered Stop   11/19/19 2200  doxycycline (VIBRA-TABS) tablet 100 mg     100 mg Oral Every 12 hours 11/19/19 0933     11/17/19 0800  vancomycin (VANCOCIN) 1,500 mg in sodium chloride 0.9 % 500 mL IVPB  Status:  Discontinued     1,500 mg 250 mL/hr over 120  Minutes Intravenous Every 12 hours 11/16/19 1953 11/19/19 0932   11/17/19 0200  piperacillin-tazobactam (ZOSYN) IVPB 3.375 g  Status:  Discontinued     3.375 g 12.5  mL/hr over 240 Minutes Intravenous Every 8 hours 11/16/19 1953 11/19/19 0932   11/16/19 2000  piperacillin-tazobactam (ZOSYN) IVPB 3.375 g     3.375 g 100 mL/hr over 30 Minutes Intravenous  Once 11/16/19 1953 11/16/19 2125   11/16/19 2000  vancomycin (VANCOCIN) IVPB 1000 mg/200 mL premix     1,000 mg 200 mL/hr over 60 Minutes Intravenous Every 1 hr x 2 11/16/19 1953 11/16/19 2226      Subjective: Primary concern is acute on chronic back pain this AM, asking for analgesia  Objective: Vitals:   11/22/19 2258 11/23/19 0526 11/23/19 0527 11/23/19 1532  BP: 110/66 (!) 135/95 (!) 135/95 131/78  Pulse: 75 70 68 71  Resp: _0 Temp: 97.7 F (36.5 C) 98.1 F (36.7 C) 98.1 F (36.7 C) 98.4 F (36.9 C)  TempSrc: Oral Oral Oral Oral  SpO2: 94% 97% 96% 98%  Weight:      Height:        Intake/Output Summary (Last 24 hours) at 11/23/2019 1850 Last data filed at 11/23/2019 1819 Gross per 24 hour  Intake 240 ml  Output 2375 ml  Net -2135 ml   Filed Weights   11/16/19 1200  Weight: 113.4 kg    Examination: General exam: Conversant, in no acute distress Respiratory system: normal chest rise, clear, no audible wheezing Cardiovascular system: regular rhythm, s1-s2 Gastrointestinal system: Nondistended, nontender, pos BS Central nervous system: No seizures, no tremors Extremities: No cyanosis, no joint deformities Skin: No rashes, no pallor Psychiatry: Affect normal // no auditory hallucinations   Data Reviewed: I have personally reviewed following labs and imaging studies  CBC: Recent Labs  Lab 11/18/19 0558 11/19/19 0505 11/20/19 0549 11/21/19 0724 11/23/19 0457  WBC 2.9* 3.4* 3.8* 4.1 5.4  NEUTROABS 1.2*  --  1.7  --   --   HGB 14.2 14.2 14.9 14.5 13.6  HCT 43.3 43.3 45.3 44.0 41.3  MCV 89.6 89.1 90.1 88.5 88.6  PLT 199 220 238 290 161   Basic Metabolic Panel: Recent Labs  Lab 11/20/19 0549 11/20/19 0835 11/21/19 0724 11/22/19 0638 11/23/19 0457  NA  135 137 137 137 132*  K 5.6* 4.2 4.2 4.3 4.2  CL 103 104 104 103 100  CO2 _1 GLUCOSE 112* 119* 112* 104* 112*  BUN _2 CREATININE 0.63 0.65 0.67 0.60* 0.62  CALCIUM 8.4* 8.7* 8.7* 8.8* 8.2*   GFR: Estimated Creatinine Clearance: 126 mL/min (by C-G formula based on SCr of 0.62 mg/dL). Liver Function Tests: Recent Labs  Lab 11/19/19 0505 11/20/19 0549 11/21/19 0724 11/22/19 0638 11/23/19 0457  AST 535* 362* 293* 219* 150*  ALT 840* 675* 543* 446* 327*  ALKPHOS 108 120 142* 150* 146*  BILITOT 4.1* 4.6* 4.7* 4.8* 4.5*  PROT 6.8 6.7 7.3 7.5 7.3  ALBUMIN 2.9* 2.9* 3.0* 3.0* 3.1*   Recent Labs  Lab 11/18/19 0558  LIPASE 45   No results for input(s): AMMONIA in the last 168 hours. Coagulation Profile: Recent Labs  Lab 11/18/19 1147 11/20/19 0549  INR 1.1 1.0   Cardiac Enzymes: Recent Labs  Lab 11/17/19 1743  CKTOTAL 52   BNP (last 3 results) No results for input(s): PROBNP in the last 8760 hours. HbA1C: No results for input(s): HGBA1C  in the last 72 hours. CBG: No results for input(s): GLUCAP in the last 168 hours. Lipid Profile: No results for input(s): CHOL, HDL, LDLCALC, TRIG, CHOLHDL, LDLDIRECT in the last 72 hours. Thyroid Function Tests: No results for input(s): TSH, T4TOTAL, FREET4, T3FREE, THYROIDAB in the last 72 hours. Anemia Panel: No results for input(s): VITAMINB12, FOLATE, FERRITIN, TIBC, IRON, RETICCTPCT in the last 72 hours. Sepsis Labs: No results for input(s): PROCALCITON, LATICACIDVEN in the last 168 hours.  Recent Results (from the past 240 hour(s))  SARS CORONAVIRUS 2 (TAT 6-24 HRS) Nasopharyngeal Nasopharyngeal Swab     Status: None   Collection Time: 11/16/19  1:45 PM   Specimen: Nasopharyngeal Swab  Result Value Ref Range Status   SARS Coronavirus 2 NEGATIVE NEGATIVE Final    Comment: (NOTE) SARS-CoV-2 target nucleic acids are NOT DETECTED. The SARS-CoV-2 RNA is generally detectable in upper and  lower respiratory specimens during the acute phase of infection. Negative results do not preclude SARS-CoV-2 infection, do not rule out co-infections with other pathogens, and should not be used as the sole basis for treatment or other patient management decisions. Negative results must be combined with clinical observations, patient history, and epidemiological information. The expected result is Negative. Fact Sheet for Patients: SugarRoll.be Fact Sheet for Healthcare Providers: https://www.woods-mathews.com/ This test is not yet approved or cleared by the [place] FDA and  has been authorized for detection and/or diagnosis of SARS-CoV-2 by FDA under an Emergency Use Authorization (EUA). This EUA will remain  in effect (meaning this test can be used) for the duration of the COVID-19 declaration under Section 56 4(b)(1) of the Act, 21 U.S.C. section 360bbb-3(b)(1), unless the authorization is terminated or revoked sooner. Performed at [facility], [place] [address], [place], [place] [address]   SARS Coronavirus 2 Ag (30 min TAT) - Nasal Swab (BD Veritor Kit)     Status: None   Collection Time: 11/16/19  3:51 PM   Specimen: Nasal Swab (BD Veritor Kit)  Result Value Ref Range Status   SARS Coronavirus 2 Ag NEGATIVE NEGATIVE Final    Comment: (NOTE) SARS-CoV-2 antigen NOT DETECTED.  Negative results are presumptive.  Negative results do not preclude SARS-CoV-2 infection and should not be used as the sole basis for treatment or other patient management decisions, including infection  control decisions, particularly in the presence of clinical signs and  symptoms consistent with COVID-19, or in those who have been in contact with the virus.  Negative results must be combined with clinical observations, patient history, and epidemiological information. The expected result is Negative. Fact Sheet for Patients:  PodPark.tn Fact Sheet for Healthcare Providers: GiftContent.is This test is not yet approved or cleared by the [place] FDA and  has been authorized for detection and/or diagnosis of SARS-CoV-2 by FDA under an Emergency Use Authorization (EUA).  This EUA will remain in effect (meaning this test can be used) for the duration of  the COVID-19 de claration under Section 564(b)(1) of the Act, 21 U.S.C. section 360bbb-3(b)(1), unless the authorization is terminated or revoked sooner. Performed at [facility], [address] [place], [place] [address]   Culture, blood (routine x 2)     Status: None   Collection Time: 11/16/19  8:15 PM   Specimen: Right Antecubital; Blood  Result Value Ref Range Status   Specimen Description   Final    RIGHT ANTECUBITAL Performed at [facility], [address] [place], [place]  [address]    Special Requests   Final    BOTTLES DRAWN AEROBIC AND ANAEROBIC Blood Culture adequate volume Performed at [facility], [address] [place], [place] [address]    Culture   Final    NO GROWTH 5 DAYS Performed at [facility], [place] [address] [place], [place] [address]    Report Status 11/21/2019 FINAL  Final  Culture, blood (routine x 2)     Status: None   Collection Time: 11/16/19  8:20 PM   Specimen: BLOOD RIGHT HAND  Result Value Ref Range Status   Specimen Description   Final    BLOOD RIGHT HAND Performed at [facility], [address] [place], [place] [address]    Special Requests   Final    BOTTLES DRAWN AEROBIC AND ANAEROBIC Blood Culture results may not be optimal due to an inadequate volume of blood received in culture bottles Performed at [facility], [address] [place], [place] [address]    Culture   Final    NO GROWTH 5 DAYS Performed at [facility], [place] [address], [place], [place] [address]    Report  Status 11/21/2019 FINAL  Final  MRSA PCR Screening     Status: None   Collection Time: 11/17/19  1:39 AM   Specimen: Nasopharyngeal  Result Value Ref Range Status   MRSA by PCR NEGATIVE NEGATIVE Final    Comment:        The GeneXpert MRSA Assay (FDA approved for NASAL specimens only), is one component of a comprehensive MRSA colonization surveillance program. It is not intended to diagnose MRSA infection nor to guide or monitor treatment for MRSA infections. Performed at [facility], [place] [address] [place], [place] [address]      Radiology Studies: No results found.  Scheduled Meds: . doxycycline  100 mg Oral Q12H  . loratadine  10 mg Oral Daily  . nicotine  14 mg Transdermal Daily  . pantoprazole  40 mg Oral Daily  . sodium chloride flush  10-40 mL Intracatheter Q12H   Continuous Infusions: . sodium chloride 10 mL/hr at 11/18/19 0413     LOS: 7 days   Marylu Lund, MD [facility] Pager On Amion  If 7PM-7AM, please contact night-coverage 11/23/2019, 6:50 PM

## 2019-11-24 LAB — COMPREHENSIVE METABOLIC PANEL
ALT: 243 U/L — ABNORMAL HIGH (ref 0–44)
AST: 105 U/L — ABNORMAL HIGH (ref 15–41)
Albumin: 2.8 g/dL — ABNORMAL LOW (ref 3.5–5.0)
Alkaline Phosphatase: 135 U/L — ABNORMAL HIGH (ref 38–126)
Anion gap: 10 (ref 5–15)
BUN: 9 mg/dL (ref 6–20)
CO2: 22 mmol/L (ref 22–32)
Calcium: 8.6 mg/dL — ABNORMAL LOW (ref 8.9–10.3)
Chloride: 105 mmol/L (ref 98–111)
Creatinine, Ser: 0.72 mg/dL (ref 0.61–1.24)
GFR calc Af Amer: >60 mL/min (ref >60)
GFR calc non Af Amer: >60 mL/min (ref >60)
Glucose, Bld: 144 mg/dL — ABNORMAL HIGH (ref 70–99)
Potassium: 4.5 mmol/L (ref 3.5–5.1)
Sodium: 137 mmol/L (ref 135–145)
Total Bilirubin: 3.7 mg/dL — ABNORMAL HIGH (ref 0.3–1.2)
Total Protein: 6.7 g/dL (ref 6.5–8.1)

## 2019-11-24 MED ORDER — CYCLOBENZAPRINE HCL 5 MG PO TABS
7.5000 mg | ORAL_TABLET | Freq: Three times a day (TID) | ORAL | Status: DC | PRN
  Administered 2019-11-24 – 2019-11-26 (×4): 7.5 mg via ORAL
  Filled 2019-11-24 (×4): qty 2

## 2019-11-24 NOTE — Progress Notes (Signed)
PROGRESS NOTE    Ronnie Moore  XNA:[id number] DOB: [birth year] DOA: 11/16/2019 PCP: Patient, No Pcp Per    Brief Narrative:  58 y.o.malewith medical history significant forparaplegia due to spinal cord stroke, wheelchair-bound, hypertension, and chronic back pain who presents to the ED for evaluation of an abscess to his left upper extremity. He presented to the ED with nausea, vomiting, left upper arm abscess. He underwent I&D of the abscess, was started on IV vancomycin. Was found to have elevated LFTs. CT abd/pelvis with suspicion for acute cholecystitis without evidence of gallstone. Prominent mildly enlarged periceliac gastrohepatic ligament lymph nodes are noted.  RUQ ultrasound showed changes consistent with a calculus cholecystitis, CBD diameter 2 mm. Patient started on IV Zosyn.   Positive for acute hepatitis A. Surgery consulted, felt like CT and US findings were secondary to hepatitis. GI consulted and following.   Assessment & Plan:   Principal Problem:   Transaminitis Active Problems:   Essential hypertension   Abscess of left upper extremity   Acalculous cholecystitis   Acute hepatitis A virus infection  Transaminitis in setting of acute hepatitis A viral infection: -Hepatitis A IgM is reactive.  -Acute hepatitis is likely cause of his transaminitis. Serum acetaminophen level is undetectable. Will continue supportive care. -Continue IV fluid hydration -Hold Tylenol -Hep C negative--patient reports being treated for it in the past -Appreciate input by GI. Recommendation for continued supportive care -Infection control had requested that they report this case of hepatitis A to the Health Dept.  -liver enzymes continuing to trend down  Acalculous cholecystitis ruled out: Without evidence of gallstones on CT or ultrasound imaging. CBD diameter is 2 mm. He has continued RUQ abdominal pain. Lipase is elevated without pancreatic ductal dilatation or  inflammation on CT imaging. -Continue IV fluid resuscitation as above -Continue pain control and antiemetics as needed. His pain worsened on 12/13, changed IV morphine as needed to IV dilaudid as needed. Patient encouraged to alternated oxycodone and Dilaudid for pain control.  -Increased po oxycodone dose to match his home dose of 48m po q4 h prn for pain.  -Bilirubin remains elevated, repeat RUQ with findings again concerning for cholecystitis. Of note, General Surgery contacted again on 12/14, recommend medical management [place].  -Continuing requiring IV narcotic, however, primary pain seems to be chronic back pain now. Weaning IV dilaudid -Of note, [facility] reviewed, pt had been prescribed suboxone this past fall -Continue to wean narcotics as tolerated -Recheck cmp in AM  Neutropenia.  -WBC normalized. Likely due to viral infection.  -recheck CBC in AM  Abscess of left upper extremity: -S/p I&D by EDP 11/16/2019. DC vancomycin, start doxycycline.  -Stable at this time  Hypertension: -Currently stable  Chronic paraplegia secondary to spinal cord stroke: -Chronic and unchanged from baseline -Therapy recs for SNF noted  DVT prophylaxis: SCD's Code Status: Full Family Communication: Pt in room, family not at bedside Disposition Plan: Uncertain at this time  Consultants:   GI  General Surgery  Procedures:   I/D of LUE 12/11  Antimicrobials: Anti-infectives (From admission, onward)   Start     Dose/Rate Route Frequency Ordered Stop   11/19/19 2200  doxycycline (VIBRA-TABS) tablet 100 mg     100 mg Oral Every 12 hours 11/19/19 0933     11/17/19 0800  vancomycin (VANCOCIN) 1,500 mg in sodium chloride 0.9 % 500 mL IVPB  Status:  Discontinued     1,500  mg 250 mL/hr over 120 Minutes Intravenous Every 12 hours 11/16/19 1953 11/19/19 0932   11/17/19 0200  piperacillin-tazobactam (ZOSYN) IVPB 3.375 g  Status:   Discontinued     3.375 g 12.5 mL/hr over 240 Minutes Intravenous Every 8 hours 11/16/19 1953 11/19/19 0932   11/16/19 2000  piperacillin-tazobactam (ZOSYN) IVPB 3.375 g     3.375 g 100 mL/hr over 30 Minutes Intravenous  Once 11/16/19 1953 11/16/19 2125   11/16/19 2000  vancomycin (VANCOCIN) IVPB 1000 mg/200 mL premix     1,000 mg 200 mL/hr over 60 Minutes Intravenous Every 1 hr x 2 11/16/19 1953 11/16/19 2226      Subjective: Complaining of continued back pain  Objective: Vitals:   11/23/19 1532 11/23/19 2029 11/24/19 0509 11/24/19 1359  BP: 131/78 134/80 118/79 107/68  Pulse: 71 78 71 71  Resp: _0 Temp: 98.4 F (36.9 C) 98.3 F (36.8 C) 98.1 F (36.7 C) 98.3 F (36.8 C)  TempSrc: Oral Oral Oral Oral  SpO2: 98% 97% 96% 97%  Weight:      Height:        Intake/Output Summary (Last 24 hours) at 11/24/2019 1837 Last data filed at 11/24/2019 1328 Gross per 24 hour  Intake 1400 ml  Output 3250 ml  Net -1850 ml   Filed Weights   11/16/19 1200  Weight: 113.4 kg    Examination: General exam: Awake, laying in bed, in nad Respiratory system: Normal respiratory effort, no wheezing Cardiovascular system: regular rate, s1, s2 Gastrointestinal system: Soft, nondistended, positive BS Central nervous system: CN2-12 grossly intact, strength intact Extremities: Perfused, no clubbing Skin: Normal skin turgor, no notable skin lesions seen Psychiatry: Mood normal // no visual hallucinations   Data Reviewed: I have personally reviewed following labs and imaging studies  CBC: Recent Labs  Lab 11/18/19 0558 11/19/19 0505 11/20/19 0549 11/21/19 0724 11/23/19 0457  WBC 2.9* 3.4* 3.8* 4.1 5.4  NEUTROABS 1.2*  --  1.7  --   --   HGB 14.2 14.2 14.9 14.5 13.6  HCT 43.3 43.3 45.3 44.0 41.3  MCV 89.6 89.1 90.1 88.5 88.6  PLT 199 220 238 290 621   Basic Metabolic Panel: Recent Labs  Lab 11/20/19 0835 11/21/19 0724 11/22/19 0638 11/23/19 0457 11/24/19 0549  NA  137 137 137 132* 137  K 4.2 4.2 4.3 4.2 4.5  CL 104 104 103 100 105  CO2 _1 GLUCOSE 119* 112* 104* 112* 144*  BUN _2 CREATININE 0.65 0.67 0.60* 0.62 0.72  CALCIUM 8.7* 8.7* 8.8* 8.2* 8.6*   GFR: Estimated Creatinine Clearance: 126 mL/min (by C-G formula based on SCr of 0.72 mg/dL). Liver Function Tests: Recent Labs  Lab 11/20/19 0549 11/21/19 0724 11/22/19 0638 11/23/19 0457 11/24/19 0549  AST 362* 293* 219* 150* 105*  ALT 675* 543* 446* 327* 243*  ALKPHOS 120 142* 150* 146* 135*  BILITOT 4.6* 4.7* 4.8* 4.5* 3.7*  PROT 6.7 7.3 7.5 7.3 6.7  ALBUMIN 2.9* 3.0* 3.0* 3.1* 2.8*   Recent Labs  Lab 11/18/19 0558  LIPASE 45   No results for input(s): AMMONIA in the last 168 hours. Coagulation Profile: Recent Labs  Lab 11/18/19 1147 11/20/19 0549  INR 1.1 1.0   Cardiac Enzymes: No results for input(s): CKTOTAL, CKMB, CKMBINDEX, TROPONINI in the last 168 hours. BNP (last 3 results) No results for input(s): PROBNP in the last 8760 hours. HbA1C: No results for input(s): HGBA1C  in the last 72 hours. CBG: No results for input(s): GLUCAP in the last 168 hours. Lipid Profile: No results for input(s): CHOL, HDL, LDLCALC, TRIG, CHOLHDL, LDLDIRECT in the last 72 hours. Thyroid Function Tests: No results for input(s): TSH, T4TOTAL, FREET4, T3FREE, THYROIDAB in the last 72 hours. Anemia Panel: No results for input(s): VITAMINB12, FOLATE, FERRITIN, TIBC, IRON, RETICCTPCT in the last 72 hours. Sepsis Labs: No results for input(s): PROCALCITON, LATICACIDVEN in the last 168 hours.  Recent Results (from the past 240 hour(s))  SARS CORONAVIRUS 2 (TAT 6-24 HRS) Nasopharyngeal Nasopharyngeal Swab     Status: None   Collection Time: 11/16/19  1:45 PM   Specimen: Nasopharyngeal Swab  Result Value Ref Range Status   SARS Coronavirus 2 NEGATIVE NEGATIVE Final    Comment: (NOTE) SARS-CoV-2 target nucleic acids are NOT DETECTED. The SARS-CoV-2 RNA is generally  detectable in upper and lower respiratory specimens during the acute phase of infection. Negative results do not preclude SARS-CoV-2 infection, do not rule out co-infections with other pathogens, and should not be used as the sole basis for treatment or other patient management decisions. Negative results must be combined with clinical observations, patient history, and epidemiological information. The expected result is Negative. Fact Sheet for Patients: SugarRoll.be Fact Sheet for Healthcare Providers: https://www.woods-mathews.com/ This test is not yet approved or cleared by the [place] FDA and  has been authorized for detection and/or diagnosis of SARS-CoV-2 by FDA under an Emergency Use Authorization (EUA). This EUA will remain  in effect (meaning this test can be used) for the duration of the COVID-19 declaration under Section 56 4(b)(1) of the Act, 21 U.S.C. section 360bbb-3(b)(1), unless the authorization is terminated or revoked sooner. Performed at [facility], [place] [address] [place], [place] [address]   SARS Coronavirus 2 Ag (30 min TAT) - Nasal Swab (BD Veritor Kit)     Status: None   Collection Time: 11/16/19  3:51 PM   Specimen: Nasal Swab (BD Veritor Kit)  Result Value Ref Range Status   SARS Coronavirus 2 Ag NEGATIVE NEGATIVE Final    Comment: (NOTE) SARS-CoV-2 antigen NOT DETECTED.  Negative results are presumptive.  Negative results do not preclude SARS-CoV-2 infection and should not be used as the sole basis for treatment or other patient management decisions, including infection  control decisions, particularly in the presence of clinical signs and  symptoms consistent with COVID-19, or in those who have been in contact with the virus.  Negative results must be combined with clinical observations, patient history, and epidemiological information. The expected result is Negative. Fact Sheet for Patients:  PodPark.tn Fact Sheet for Healthcare Providers: GiftContent.is This test is not yet approved or cleared by the [place] FDA and  has been authorized for detection and/or diagnosis of SARS-CoV-2 by FDA under an Emergency Use Authorization (EUA).  This EUA will remain in effect (meaning this test can be used) for the duration of  the COVID-19 de claration under Section 564(b)(1) of the Act, 21 U.S.C. section 360bbb-3(b)(1), unless the authorization is terminated or revoked sooner. Performed at [facility], [address] [place], [place] [address]   Culture, blood (routine x 2)     Status: None   Collection Time: 11/16/19  8:15 PM   Specimen: Right Antecubital; Blood  Result Value Ref Range Status   Specimen Description   Final    RIGHT ANTECUBITAL Performed at [facility], [address] [place], [place]  [address]    Special Requests   Final    BOTTLES DRAWN AEROBIC AND ANAEROBIC Blood Culture adequate volume Performed at [facility], [address] [place], [place] [address]    Culture   Final    NO GROWTH 5 DAYS Performed at [facility], [place] [address], [place], [place] [address]    Report Status 11/21/2019 FINAL  Final  Culture, blood (routine x 2)     Status: None   Collection Time: 11/16/19  8:20 PM   Specimen: BLOOD RIGHT HAND  Result Value Ref Range Status   Specimen Description   Final    BLOOD RIGHT HAND Performed at [facility], [address] [place], [place] [address]    Special Requests   Final    BOTTLES DRAWN AEROBIC AND ANAEROBIC Blood Culture results may not be optimal due to an inadequate volume of blood received in culture bottles Performed at [facility], [address] [place], [place] [address]    Culture   Final    NO GROWTH 5 DAYS Performed at [facility], [place] [address], [place], [place] [address]    Report  Status 11/21/2019 FINAL  Final  MRSA PCR Screening     Status: None   Collection Time: 11/17/19  1:39 AM   Specimen: Nasopharyngeal  Result Value Ref Range Status   MRSA by PCR NEGATIVE NEGATIVE Final    Comment:        The GeneXpert MRSA Assay (FDA approved for NASAL specimens only), is one component of a comprehensive MRSA colonization surveillance program. It is not intended to diagnose MRSA infection nor to guide or monitor treatment for MRSA infections. Performed at [facility], [place] [address] [place], [place] [address]      Radiology Studies: No results found.  Scheduled Meds: . doxycycline  100 mg Oral Q12H  . loratadine  10 mg Oral Daily  . nicotine  14 mg Transdermal Daily  . pantoprazole  40 mg Oral Daily  . sodium chloride flush  10-40 mL Intracatheter Q12H   Continuous Infusions: . sodium chloride 10 mL/hr at 11/18/19 0413     LOS: 8 days   Marylu Lund, MD [facility] Pager On Amion  If 7PM-7AM, please contact night-coverage 11/24/2019, 6:37 PM

## 2019-11-25 LAB — COMPREHENSIVE METABOLIC PANEL
ALT: 191 U/L — ABNORMAL HIGH (ref 0–44)
AST: 77 U/L — ABNORMAL HIGH (ref 15–41)
Albumin: 2.9 g/dL — ABNORMAL LOW (ref 3.5–5.0)
Alkaline Phosphatase: 140 U/L — ABNORMAL HIGH (ref 38–126)
Anion gap: 9 (ref 5–15)
BUN: 8 mg/dL (ref 6–20)
CO2: 23 mmol/L (ref 22–32)
Calcium: 8.6 mg/dL — ABNORMAL LOW (ref 8.9–10.3)
Chloride: 105 mmol/L (ref 98–111)
Creatinine, Ser: 0.64 mg/dL (ref 0.61–1.24)
GFR calc Af Amer: >60 mL/min (ref >60)
GFR calc non Af Amer: >60 mL/min (ref >60)
Glucose, Bld: 125 mg/dL — ABNORMAL HIGH (ref 70–99)
Potassium: 4.4 mmol/L (ref 3.5–5.1)
Sodium: 137 mmol/L (ref 135–145)
Total Bilirubin: 3.3 mg/dL — ABNORMAL HIGH (ref 0.3–1.2)
Total Protein: 6.6 g/dL (ref 6.5–8.1)

## 2019-11-25 LAB — CBC
HCT: 40.1 % (ref 39.0–52.0)
Hemoglobin: 13.4 g/dL (ref 13.0–17.0)
MCH: 29.5 pg (ref 26.0–34.0)
MCHC: 33.4 g/dL (ref 30.0–36.0)
MCV: 88.3 fL (ref 80.0–100.0)
Platelets: 253 10*3/uL (ref 150–400)
RBC: 4.54 MIL/uL (ref 4.22–5.81)
RDW: 16.3 % — ABNORMAL HIGH (ref 11.5–15.5)
WBC: 5.1 10*3/uL (ref 4.0–10.5)
nRBC: 0 % (ref 0.0–0.2)

## 2019-11-25 MED ORDER — LACTATED RINGERS IV SOLN: INTRAVENOUS | Status: DC

## 2019-11-25 NOTE — Progress Notes (Addendum)
PROGRESS NOTE    KENDAN CORNFORTH  RWE:[id number] DOB: [birth year] DOA: 11/16/2019 PCP: Patient, No Pcp Per    Brief Narrative:  58 y.o.malewith medical history significant forparaplegia due to spinal cord stroke, wheelchair-bound, hypertension, and chronic back pain who presents to the ED for evaluation of an abscess to his left upper extremity. He presented to the ED with nausea, vomiting, left upper arm abscess. He underwent I&D of the abscess, was started on IV vancomycin. Was found to have elevated LFTs. CT abd/pelvis with suspicion for acute cholecystitis without evidence of gallstone. Prominent mildly enlarged periceliac gastrohepatic ligament lymph nodes are noted.  RUQ ultrasound showed changes consistent with a calculus cholecystitis, CBD diameter 2 mm. Patient started on IV Zosyn.   Positive for acute hepatitis A. Surgery consulted, felt like CT and US findings were secondary to hepatitis. GI consulted and following.   Assessment & Plan:   Principal Problem:   Transaminitis Active Problems:   Essential hypertension   Abscess of left upper extremity   Acalculous cholecystitis   Acute hepatitis A virus infection  Transaminitis in setting of acute hepatitis A viral infection: -Hepatitis A IgM is reactive.  -Acute hepatitis is likely cause of his transaminitis. Serum acetaminophen level is undetectable. Will continue supportive care. -Continue IV fluid hydration -Hold Tylenol -Hep C negative--patient reports being treated for it in the past -Appreciate input by GI. Recommendation for continued supportive care -Infection control had requested that they report this case of hepatitis A to the Health Dept.  -LFT's continuing to trend down. Repeat cmp in AM  Acalculous cholecystitis ruled out: Without evidence of gallstones on CT or ultrasound imaging. CBD diameter is 2 mm. He has continued RUQ abdominal pain. Lipase is elevated without pancreatic ductal dilatation  or inflammation on CT imaging. -Continue IV fluid resuscitation as above -Continue pain control and antiemetics as needed. His pain worsened on 12/13, changed IV morphine as needed to IV dilaudid as needed. Patient encouraged to alternated oxycodone and Dilaudid for pain control.  -Increased po oxycodone dose to match his home dose of 82m po q4 h prn for pain.  -Bilirubin remains elevated, repeat RUQ with findings again concerning for cholecystitis. Of note, General Surgery contacted again on 12/14, [place].  -Continuing requiring IV narcotic, however, primary pain seems to be chronic back pain now. Weaning IV dilaudid -Of note, [facility] reviewed, pt had been prescribed suboxone this past fall -Continue to wean narcotics as tolerated -Repeat cmp in AM  Neutropenia.  -WBC normalized. Likely due to viral infection.  -recheck CBC in AM  Abscess of left upper extremity: -S/p I&D by EDP 11/16/2019. DC vancomycin, start doxycycline.  -Stable at this time  Hypertension: -Currently stable  Chronic paraplegia secondary to spinal cord stroke: -Chronic and unchanged from baseline -Therapy recs for SNF noted, SW following. Awaiting insurance authorization  Dehydration -urine appears more concentrated this AM -Mucus membranes dry. Pt reports increased thirst -Labs reviewed. Renal function remains stable with Cr of 0.64 -Will start IVF at 75cc/hr -Repeat lytes in AM  DVT prophylaxis: SCD's Code Status: Full Family Communication: Pt in room, family not at bedside Disposition Plan: Uncertain at this time  Consultants:   GI  General Surgery  Procedures:   I/D of LUE 12/11  Antimicrobials: Anti-infectives (From admission, onward)   Start     Dose/Rate Route Frequency Ordered Stop   11/19/19 2200  doxycycline (VIBRA-TABS) tablet  100 mg     100 mg Oral Every 12 hours 11/19/19 0933      11/17/19 0800  vancomycin (VANCOCIN) 1,500 mg in sodium chloride 0.9 % 500 mL IVPB  Status:  Discontinued     1,500 mg 250 mL/hr over 120 Minutes Intravenous Every 12 hours 11/16/19 1953 11/19/19 0932   11/17/19 0200  piperacillin-tazobactam (ZOSYN) IVPB 3.375 g  Status:  Discontinued     3.375 g 12.5 mL/hr over 240 Minutes Intravenous Every 8 hours 11/16/19 1953 11/19/19 0932   11/16/19 2000  piperacillin-tazobactam (ZOSYN) IVPB 3.375 g     3.375 g 100 mL/hr over 30 Minutes Intravenous  Once 11/16/19 1953 11/16/19 2125   11/16/19 2000  vancomycin (VANCOCIN) IVPB 1000 mg/200 mL premix     1,000 mg 200 mL/hr over 60 Minutes Intravenous Every 1 hr x 2 11/16/19 1953 11/16/19 2226      Subjective: Still complaining of chronic back pain, some improvement with heat packs. Apprehensive about weaning off narcotics  Objective: Vitals:   11/24/19 1359 11/24/19 2049 11/25/19 0444 11/25/19 1340  BP: 107/68 124/79 119/72 129/75  Pulse: 71 79 67 76  Resp: _0 Temp: 98.3 F (36.8 C) 98.2 F (36.8 C) 97.9 F (36.6 C) 98.2 F (36.8 C)  TempSrc: Oral Oral Oral Oral  SpO2: 97% 95% 98% 99%  Weight:      Height:        Intake/Output Summary (Last 24 hours) at 11/25/2019 1540 Last data filed at 11/25/2019 1521 Gross per 24 hour  Intake 566.96 ml  Output 2300 ml  Net -1733.04 ml   Filed Weights   11/16/19 1200  Weight: 113.4 kg    Examination: General exam: Conversant, appears uncomfortable Respiratory system: normal chest rise, clear, no audible wheezing Cardiovascular system: regular rhythm, s1-s2 Gastrointestinal system: Nondistended, nontender, pos BS Central nervous system: No seizures, no tremors Extremities: No cyanosis, no joint deformities Skin: No rashes, no pallor Psychiatry: Affect normal // no auditory hallucinations   Data Reviewed: I have personally reviewed following labs and imaging studies  CBC: Recent Labs  Lab 11/19/19 0505 11/20/19 0549  11/21/19 0724 11/23/19 0457 11/25/19 0455  WBC 3.4* 3.8* 4.1 5.4 5.1  NEUTROABS  --  1.7  --   --   --   HGB 14.2 14.9 14.5 13.6 13.4  HCT 43.3 45.3 44.0 41.3 40.1  MCV 89.1 90.1 88.5 88.6 88.3  PLT 220 238 290 317 297   Basic Metabolic Panel: Recent Labs  Lab 11/21/19 0724 11/22/19 0638 11/23/19 0457 11/24/19 0549 11/25/19 0455  NA 137 137 132* 137 137  K 4.2 4.3 4.2 4.5 4.4  CL 104 103 100 105 105  CO2 _1 GLUCOSE 112* 104* 112* 144* 125*  BUN _2 CREATININE 0.67 0.60* 0.62 0.72 0.64  CALCIUM 8.7* 8.8* 8.2* 8.6* 8.6*   GFR: Estimated Creatinine Clearance: 126 mL/min (by C-G formula based on SCr of 0.64 mg/dL). Liver Function Tests: Recent Labs  Lab 11/21/19 0724 11/22/19 9892 11/23/19 0457 11/24/19 0549 11/25/19 0455  AST 293* 219* 150* 105* 77*  ALT 543* 446* 327* 243* 191*  ALKPHOS 142* 150* 146* 135* 140*  BILITOT 4.7* 4.8* 4.5* 3.7* 3.3*  PROT 7.3 7.5 7.3 6.7 6.6  ALBUMIN 3.0* 3.0* 3.1* 2.8* 2.9*   No results for input(s): LIPASE, AMYLASE in the last 168 hours. No results for input(s): AMMONIA in the last 168 hours. Coagulation  Profile: Recent Labs  Lab 11/20/19 0549  INR 1.0   Cardiac Enzymes: No results for input(s): CKTOTAL, CKMB, CKMBINDEX, TROPONINI in the last 168 hours. BNP (last 3 results) No results for input(s): PROBNP in the last 8760 hours. HbA1C: No results for input(s): HGBA1C in the last 72 hours. CBG: No results for input(s): GLUCAP in the last 168 hours. Lipid Profile: No results for input(s): CHOL, HDL, LDLCALC, TRIG, CHOLHDL, LDLDIRECT in the last 72 hours. Thyroid Function Tests: No results for input(s): TSH, T4TOTAL, FREET4, T3FREE, THYROIDAB in the last 72 hours. Anemia Panel: No results for input(s): VITAMINB12, FOLATE, FERRITIN, TIBC, IRON, RETICCTPCT in the last 72 hours. Sepsis Labs: No results for input(s): PROCALCITON, LATICACIDVEN in the last 168 hours.  Recent Results (from the past 240  hour(s))  SARS CORONAVIRUS 2 (TAT 6-24 HRS) Nasopharyngeal Nasopharyngeal Swab     Status: None   Collection Time: 11/16/19  1:45 PM   Specimen: Nasopharyngeal Swab  Result Value Ref Range Status   SARS Coronavirus 2 NEGATIVE NEGATIVE Final    Comment: (NOTE) SARS-CoV-2 target nucleic acids are NOT DETECTED. The SARS-CoV-2 RNA is generally detectable in upper and lower respiratory specimens during the acute phase of infection. Negative results do not preclude SARS-CoV-2 infection, do not rule out co-infections with other pathogens, and should not be used as the sole basis for treatment or other patient management decisions. Negative results must be combined with clinical observations, patient history, and epidemiological information. The expected result is Negative. Fact Sheet for Patients: SugarRoll.be Fact Sheet for Healthcare Providers: https://www.woods-mathews.com/ This test is not yet approved or cleared by the [place] FDA and  has been authorized for detection and/or diagnosis of SARS-CoV-2 by FDA under an Emergency Use Authorization (EUA). This EUA will remain  in effect (meaning this test can be used) for the duration of the COVID-19 declaration under Section 56 4(b)(1) of the Act, 21 U.S.C. section 360bbb-3(b)(1), unless the authorization is terminated or revoked sooner. Performed at [facility], [place] [address] [place], [place] [address]   SARS Coronavirus 2 Ag (30 min TAT) - Nasal Swab (BD Veritor Kit)     Status: None   Collection Time: 11/16/19  3:51 PM   Specimen: Nasal Swab (BD Veritor Kit)  Result Value Ref Range Status   SARS Coronavirus 2 Ag NEGATIVE NEGATIVE Final    Comment: (NOTE) SARS-CoV-2 antigen NOT DETECTED.  Negative results are presumptive.  Negative results do not preclude SARS-CoV-2 infection and should not be used as the sole basis for treatment or other patient management decisions, including  infection  control decisions, particularly in the presence of clinical signs and  symptoms consistent with COVID-19, or in those who have been in contact with the virus.  Negative results must be combined with clinical observations, patient history, and epidemiological information. The expected result is Negative. Fact Sheet for Patients: PodPark.tn Fact Sheet for Healthcare Providers: GiftContent.is This test is not yet approved or cleared by the [place] FDA and  has been authorized for detection and/or diagnosis of SARS-CoV-2 by FDA under an Emergency Use Authorization (EUA).  This EUA will remain in effect (meaning this test can be used) for the duration of  the COVID-19 de claration under Section 564(b)(1) of the Act, 21 U.S.C. section 360bbb-3(b)(1), unless the authorization is terminated or revoked sooner. Performed at [facility], [address] [place], [place] [address]   Culture, blood (routine x 2)  Status: None   Collection Time: 11/16/19  8:15 PM   Specimen: Right Antecubital; Blood  Result Value Ref Range Status   Specimen Description   Final    RIGHT ANTECUBITAL Performed at [facility], [address] [place], [place] [address]    Special Requests   Final    BOTTLES DRAWN AEROBIC AND ANAEROBIC Blood Culture adequate volume Performed at [facility], [address] [place], [place] [address]    Culture   Final    NO GROWTH 5 DAYS Performed at [facility], [place] [address] [place], [place] [address]    Report Status 11/21/2019 FINAL  Final  Culture, blood (routine x 2)     Status: None   Collection Time: 11/16/19  8:20 PM   Specimen: BLOOD RIGHT HAND  Result Value Ref Range Status   Specimen Description   Final    BLOOD RIGHT HAND Performed at [facility], [address] [place], [place] [address]    Special Requests   Final    BOTTLES  DRAWN AEROBIC AND ANAEROBIC Blood Culture results may not be optimal due to an inadequate volume of blood received in culture bottles Performed at [facility], [address] [place], [place] [address]    Culture   Final    NO GROWTH 5 DAYS Performed at [facility], [place] [address], [place], [place] [address]    Report Status 11/21/2019 FINAL  Final  MRSA PCR Screening     Status: None   Collection Time: 11/17/19  1:39 AM   Specimen: Nasopharyngeal  Result Value Ref Range Status   MRSA by PCR NEGATIVE NEGATIVE Final    Comment:        The GeneXpert MRSA Assay (FDA approved for NASAL specimens only), is one component of a comprehensive MRSA colonization surveillance program. It is not intended to diagnose MRSA infection nor to guide or monitor treatment for MRSA infections. Performed at [facility], [place] [address] [place],  [address]      Radiology Studies: No results found.  Scheduled Meds: . doxycycline  100 mg Oral Q12H  . loratadine  10 mg Oral Daily  . nicotine  14 mg Transdermal Daily  . pantoprazole  40 mg Oral Daily  . sodium chloride flush  10-40 mL Intracatheter Q12H   Continuous Infusions: . sodium chloride 10 mL/hr at 11/18/19 0413  . lactated ringers 76 mL/hr at 11/25/19 1521     LOS: 9 days   Marylu Lund, MD [facility] Pager On Amion  If 7PM-7AM, please contact night-coverage 11/25/2019, 3:40 PM

## 2019-11-26 LAB — COMPREHENSIVE METABOLIC PANEL
ALT: 147 U/L — ABNORMAL HIGH (ref 0–44)
AST: 62 U/L — ABNORMAL HIGH (ref 15–41)
Albumin: 2.8 g/dL — ABNORMAL LOW (ref 3.5–5.0)
Alkaline Phosphatase: 142 U/L — ABNORMAL HIGH (ref 38–126)
Anion gap: 8 (ref 5–15)
BUN: 8 mg/dL (ref 6–20)
CO2: 25 mmol/L (ref 22–32)
Calcium: 8.7 mg/dL — ABNORMAL LOW (ref 8.9–10.3)
Chloride: 104 mmol/L (ref 98–111)
Creatinine, Ser: 0.72 mg/dL (ref 0.61–1.24)
GFR calc Af Amer: >60 mL/min (ref >60)
GFR calc non Af Amer: >60 mL/min (ref >60)
Glucose, Bld: 103 mg/dL — ABNORMAL HIGH (ref 70–99)
Potassium: 4.2 mmol/L (ref 3.5–5.1)
Sodium: 137 mmol/L (ref 135–145)
Total Bilirubin: 2.8 mg/dL — ABNORMAL HIGH (ref 0.3–1.2)
Total Protein: 6.8 g/dL (ref 6.5–8.1)

## 2019-11-26 LAB — SARS CORONAVIRUS 2 (TAT 6-24 HRS): SARS Coronavirus 2: NEGATIVE

## 2019-11-26 MED ORDER — CYCLOBENZAPRINE HCL 5 MG PO TABS
7.5000 mg | ORAL_TABLET | Freq: Three times a day (TID) | ORAL | Status: DC | PRN
  Administered 2019-11-26 – 2019-11-28 (×3): 7.5 mg via ORAL
  Filled 2019-11-26 (×3): qty 2

## 2019-11-26 MED ORDER — GABAPENTIN 300 MG PO CAPS: 300.0000 mg | ORAL_CAPSULE | Freq: Three times a day (TID) | ORAL | Status: DC

## 2019-11-26 MED ORDER — GABAPENTIN 400 MG PO CAPS
400.0000 mg | ORAL_CAPSULE | Freq: Three times a day (TID) | ORAL | Status: DC
  Administered 2019-11-26 – 2019-11-28 (×6): 400 mg via ORAL
  Filled 2019-11-26 (×6): qty 1

## 2019-11-26 MED ORDER — BACLOFEN 10 MG PO TABS: 10.0000 mg | ORAL_TABLET | Freq: Three times a day (TID) | ORAL | Status: DC

## 2019-11-26 MED ORDER — HYDROMORPHONE HCL 1 MG/ML IJ SOLN
1.0000 mg | Freq: Three times a day (TID) | INTRAMUSCULAR | Status: DC | PRN
  Administered 2019-11-26 – 2019-11-28 (×6): 1 mg via INTRAVENOUS
  Filled 2019-11-26 (×6): qty 1

## 2019-11-26 MED ORDER — TIZANIDINE HCL 4 MG PO TABS: 4.0000 mg | ORAL_TABLET | Freq: Four times a day (QID) | ORAL | Status: DC | PRN

## 2019-11-26 NOTE — Progress Notes (Signed)
Occupational Therapy Treatment Patient Details Name: Ronnie Moore MRN: [id number] DOB: 1961/09/22 Today's Date: 11/26/2019    History of present illness Ronnie Moore is a 58 y.o. 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   OT comments  This patient seen second time today due to nursing staff needed A for back to bed so had patient work on lateral transfer from drop arm recliner--still needing Mod A+2 as he did earlier today. We will continue to follow.       Follow Up Recommendations  SNF;Supervision/Assistance - 24 hour    Equipment Recommendations  3 in 1 bedside commode;Hospital bed       Precautions / Restrictions Precautions Precautions: Fall Restrictions Weight Bearing Restrictions: No       Mobility Bed Mobility Overal bed mobility: Needs Assistance Bed Mobility: Rolling Rolling: Supervision(to left)        Transfers Overall transfer level: Needs assistance Equipment used: None Transfers: Lateral/Scoot Transfers          Lateral/Scoot Transfers: Mod assist;+2 physical assistance General transfer comment: going from drop arm recliner to bed on patient's left with use of pad, VCs for safe technique, in front of patient to make sure he stayed forward for transfer        ADL either performed or assessed with clinical judgement   ADL Overall ADL's : Needs assistance/impaired                   Toilet Transfer: Moderate assistance;+2 for physical assistance Toilet Transfer Details (indicate cue type and reason): drop arm recliner to bed going to patient's left               Vision Baseline Vision/History: Wears glasses Wears Glasses: Reading only Patient Visual Report: No change from baseline            Cognition Arousal/Alertness: Awake/alert Behavior During  Therapy: Impulsive                       General Comments: wanting to go from recliner to bed before we were adequately in place to be there to A and for safety                   Pertinent Vitals/ Pain       Pain Assessment: No/denies pain                                                          Frequency  Min 2X/week        Progress Toward Goals  OT Goals(current goals can now be found in the care plan section)  Progress towards OT goals: Not progressing toward goals - comment(same level of A as he was ealier today)  Acute Rehab OT Goals Patient Stated Goal: to find a better housing situation  Plan Discharge plan remains appropriate    Co-evaluation      Reason for Co-Treatment: For patient/therapist safety;To address functional/ADL transfers PT goals addressed during session: Mobility/safety with mobility;Balance OT goals addressed during session: ADL's and self-care      AM-PAC OT "6 Clicks" Daily Activity  Outcome Measure   Help from another person eating meals?: A Little Help from another person taking care of personal grooming?: A Lot Help from another person toileting, which includes using toliet, bedpan, or urinal?: A Lot Help from another person bathing (including washing, rinsing, drying)?: A Lot Help from another person to put on and taking off regular upper body clothing?: A Lot Help from another person to put on and taking off regular lower body clothing?: Total 6 Click Score: 12    End of Session Equipment Utilized During Treatment: Gait belt  OT Visit Diagnosis: Unsteadiness on feet (R26.81);Other abnormalities of gait and mobility (R26.89);Repeated falls (R29.6);Muscle weakness (generalized) (M62.81);History of falling (Z91.81);Other symptoms and signs involving cognitive function   Activity Tolerance Patient tolerated treatment well   Patient Left in bed(with NT getting him cleaned up due to small bowel  movement while in relciner)   Nurse Communication Mobility status        Time: [id number] OT Time Calculation (min): 9 min  Charges: OT General Charges $OT Visit: 1 Visit OT Treatments $Self Care/Home Management : 8-22 mins   [facility]  OTR/L Acute Rehab Services Pager [phone] Office [phone]  11/26/2019, 4:07 PM

## 2019-11-26 NOTE — Progress Notes (Addendum)
Occupational Therapy Treatment Patient Details Name: Ronnie Moore MRN: [id number] DOB: Mar 27, 1961 Today's Date: 11/26/2019    History of present illness Ronnie Moore is a 58 y.o. 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   OT comments  Pt with good participation this day!  Follow Up Recommendations  SNF  Equipment Recommendations  3 in 1 bedside commode;Hospital bed    Recommendations for Other Services      Precautions / Restrictions Precautions Precautions: Fall Restrictions Weight Bearing Restrictions: No       Mobility Bed Mobility Overal bed mobility: Needs Assistance Bed Mobility: Rolling;Sidelying to Sit Rolling: Min assist Sidelying to sit: Mod assist;+2 for safety/equipment;HOB elevated       General bed mobility comments: assist with LEs and trunk to upright, incr time, use of rail  Transfers Overall transfer level: Needs assistance Equipment used: None Transfers: Lateral/Scoot Transfers          Lateral/Scoot Transfers: Mod assist;+2 physical assistance;+2 safety/equipment General transfer comment: assist to wt shift anteriorly adn laterally, multi-modal cues for technique, bed bad used to assist, LEs/knees blocked to prevent uncontrolled knee extension    Balance Overall balance assessment: Needs assistance Sitting-balance support: Feet supported;Bilateral upper extremity supported;Single extremity supported;No upper extremity supported Sitting balance-Leahy Scale: Fair Sitting balance - Comments: minor LOB with initial sit EOB                                   ADL either performed or assessed with clinical judgement   ADL Overall ADL's : Needs assistance/impaired Eating/Feeding: Set up;Sitting   Grooming: Set up;Sitting                   Toilet  Transfer: +2 for physical assistance;+2 for safety/equipment;Moderate assistance Toilet Transfer Details (indicate cue type and reason): simulated to droparm recliner from [place] and Hygiene: Total assistance;Sitting/lateral lean               Vision Baseline Vision/History: No visual deficits            Cognition Arousal/Alertness: Awake/alert Behavior During Therapy: WFL for tasks assessed/performed Overall Cognitive Status: Within Functional Limits for tasks assessed(did need VC to focus on task at hand but followed all directions) Area of Impairment: Attention                   Current Attention Level: Sustained   Following Commands: Follows one step commands consistently;Follows multi-step commands with increased time                           Pertinent Vitals/ Pain       Pain Assessment: 0-10 Pain Score: 2  Pain Location: did not complain of any pain- pt had just had a pain shot Pain Descriptors / Indicators: Grimacing Pain Intervention(s): Limited activity within patient's tolerance;Monitored during session;Premedicated before session;Repositioned;Patient requesting pain meds-RN notified  Home Living                                              Frequency  Min 2X/week  Progress Toward Goals  OT Goals(current goals can now be found in the care plan section)  Progress towards OT goals: Progressing toward goals  Acute Rehab OT Goals Patient Stated Goal: to find a better housing situation  Plan Discharge plan needs to be updated    Co-evaluation      Reason for Co-Treatment: For patient/therapist safety;To address functional/ADL transfers PT goals addressed during session: Mobility/safety with mobility;Balance OT goals addressed during session: ADL's and self-care      AM-PAC OT "6 Clicks" Daily Activity     Outcome Measure   Help from another person eating meals?: A  Little Help from another person taking care of personal grooming?: A Lot Help from another person toileting, which includes using toliet, bedpan, or urinal?: A Lot Help from another person bathing (including washing, rinsing, drying)?: A Lot Help from another person to put on and taking off regular upper body clothing?: A Lot Help from another person to put on and taking off regular lower body clothing?: A Lot 6 Click Score: 13    End of Session Equipment Utilized During Treatment: Gait belt  OT Visit Diagnosis: Unsteadiness on feet (R26.81);Other abnormalities of gait and mobility (R26.89);Repeated falls (R29.6);Muscle weakness (generalized) (M62.81);History of falling (Z91.81);Other symptoms and signs involving cognitive function   Activity Tolerance Patient tolerated treatment well   Patient Left with call bell/phone within reach;with bed alarm set;in chair   Nurse Communication Mobility status        Time: 0125-0138 OT Time Calculation (min): 13 min  Charges: OT General Charges $OT Visit: 1 Visit OT Treatments $Self Care/Home Management : 8-22 mins  Kari Baars, [place] [phone] Office- [phone]      Danniel Tones, Edwena Felty D 11/26/2019, 2:40 PM

## 2019-11-26 NOTE — Care Management Important Message (Signed)
Important Message  Patient Details IM Letter given to Ronnie Doctor RN Case Manager to present to the Patient Name: Ronnie Moore MRN: [id number] Date of Birth: 1961-05-22   Medicare Important Message Given:  Yes     Ronnie Moore 11/26/2019, 11:15 AM

## 2019-11-26 NOTE — Progress Notes (Signed)
Physical Therapy Treatment Patient Details Name: Ronnie Moore MRN: [id number] DOB: 12-27-1960 Today's Date: 11/26/2019    History of Present Illness Ronnie Moore is a 58 y.o. 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    PT Comments    Pt progressing well, much improved effort with transfers, decr assist needed overall.   Pt will benefit from SNF  Post acute. Will continue PT POC in acute setting  Follow Up Recommendations  SNF;Supervision/Assistance - 24 hour     Equipment Recommendations       Recommendations for Other Services       Precautions / Restrictions Precautions Precautions: Fall Restrictions Weight Bearing Restrictions: No    Mobility  Bed Mobility Overal bed mobility: Needs Assistance Bed Mobility: Rolling;Sidelying to Sit Rolling: Min assist Sidelying to sit: Mod assist;+2 for safety/equipment;HOB elevated       General bed mobility comments: assist with LEs and trunk to upright, incr time, use of rail  Transfers Overall transfer level: Needs assistance Equipment used: None Transfers: Lateral/Scoot Transfers          Lateral/Scoot Transfers: Mod assist;+2 physical assistance;+2 safety/equipment General transfer comment: assist to wt shift anteriorly adn laterally, multi-modal cues for technique, bed bad used to assist, LEs/knees blocked to prevent uncontrolled knee extension  Ambulation/Gait                 Stairs             Wheelchair Mobility    Modified Rankin (Stroke Patients Only)       Balance Overall balance assessment: Needs assistance Sitting-balance support: Feet supported;Bilateral upper extremity supported;Single extremity supported;No upper extremity supported Sitting balance-Leahy Scale: Fair Sitting balance - Comments: minor LOB with  initial sit EOB                                    Cognition Arousal/Alertness: Awake/alert Behavior During Therapy: WFL for tasks assessed/performed Overall Cognitive Status: No family/caregiver present to determine baseline cognitive functioning Area of Impairment: Attention                   Current Attention Level: Sustained   Following Commands: Follows one step commands consistently;Follows multi-step commands with increased time              Exercises      General Comments        Pertinent Vitals/Pain Pain Assessment: 0-10 Pain Score: 2  Pain Location: back Pain Descriptors / Indicators: Grimacing Pain Intervention(s): Limited activity within patient's tolerance;Monitored during session;Premedicated before session;Repositioned;Patient requesting pain meds-RN notified    Home Living                      Prior Function            PT Goals (current goals can now be found in the care plan section) Acute Rehab PT Goals Patient Stated Goal: to find a better housing situation PT Goal Formulation: With patient Time For Goal Achievement: 12/04/19 Potential to Achieve Goals: Good Progress towards PT goals: Progressing toward goals    Frequency           PT Plan Current plan remains appropriate    Co-evaluation PT/OT/SLP Co-Evaluation/Treatment: Yes Reason for Co-Treatment:  For patient/therapist safety;To address functional/ADL transfers PT goals addressed during session: Mobility/safety with mobility;Balance        AM-PAC PT "6 Clicks" Mobility   Outcome Measure  Help needed turning from your back to your side while in a flat bed without using bedrails?: A Lot Help needed moving from lying on your back to sitting on the side of a flat bed without using bedrails?: A Lot Help needed moving to and from a bed to a chair (including a wheelchair)?: Total Help needed standing up from a chair using your arms (e.g., wheelchair  or bedside chair)?: Total Help needed to walk in hospital room?: Total Help needed climbing 3-5 steps with a railing? : Total 6 Click Score: 8    End of Session   Activity Tolerance: Patient tolerated treatment well Patient left: in chair;with call bell/phone within reach;with chair alarm set Nurse Communication: Mobility status PT Visit Diagnosis: Other abnormalities of gait and mobility (R26.89);Muscle weakness (generalized) (M62.81);Pain     Time: 1316-1340 PT Time Calculation (min) (ACUTE ONLY): 24 min  Charges:  $Therapeutic Activity: 8-22 mins                     Baxter Flattery, PT   Acute Rehab Dept [facility]): [id number]   11/26/2019    [facility] 11/26/2019, 1:52 PM

## 2019-11-26 NOTE — Progress Notes (Signed)
PT Cancellation Note  Patient Details Name: Ronnie Moore MRN: [id number] DOB: 13-Aug-1961   Cancelled Treatment:     pt just got lunch. Will attempt again as schedule allows   [facility] 11/26/2019, 1:14 PM

## 2019-11-26 NOTE — TOC Progression Note (Signed)
Transition of Care [facility]) - Progression Note    Patient Details  Name: Ronnie Moore MRN: [id number] Date of Birth: [birth year]  Transition of Care [facility]) CM/SW Contact  Shraga Custard, Marjie Skiff, RN Phone Number: 11/26/2019, 10:33 AM  Clinical Narrative:    Passr received [id number]. Additional clinicals sent to [facility] for insurance auth. Navi also informed that SNF choice is [place]. MD alerted of need for new Covid test, last one was 12/11.   Expected Discharge Plan: [place] Barriers to Discharge: Continued Medical Work up  Expected Discharge Plan and Services Expected Discharge Plan: [place] arrangements for the past 2 months: Apartment                                       Social Determinants of Health (SDOH) Interventions    Readmission Risk Interventions No flowsheet data found.

## 2019-11-26 NOTE — Progress Notes (Signed)
PROGRESS NOTE    Ronnie Moore  [id number] DOB: 07-09-1961 DOA: 11/16/2019 PCP: Patient, No Pcp Per    Brief Narrative:  58 y.o.malewith medical history significant forparaplegia due to spinal cord stroke, wheelchair-bound, hypertension, and chronic back pain who presents to the ED for evaluation of an abscess to his left upper extremity. He presented to the ED with nausea, vomiting, left upper arm abscess. He underwent I&D of the abscess, was started on IV vancomycin. Was found to have elevated LFTs. CT abd/pelvis with suspicion for acute cholecystitis without evidence of gallstone. Prominent mildly enlarged periceliac gastrohepatic ligament lymph nodes are noted.  RUQ ultrasound showed changes consistent with a calculus cholecystitis, CBD diameter 2 mm. Patient started on IV Zosyn.   Positive for acute hepatitis A. Surgery consulted, felt like CT and US findings were secondary to hepatitis. GI consulted and following.   Assessment & Plan:   Principal Problem:   Transaminitis Active Problems:   Essential hypertension   Abscess of left upper extremity   Acalculous cholecystitis   Acute hepatitis A virus infection  Transaminitis in setting of acute hepatitis A viral infection: -Hepatitis A IgM is reactive.  -Acute hepatitis is likely cause of his transaminitis. Serum acetaminophen level is undetectable. Will continue supportive care. -Continue IV fluid hydration -Hold Tylenol -Hep C negative--patient reports being treated for it in the past -Appreciate input by GI. Recommendation for continued supportive care -Infection control had requested that they report this case of hepatitis A to the Health Dept.  -LFT's continuing to trend down. Recheck CMP in AM  Acalculous cholecystitis ruled out: Without evidence of gallstones on CT or ultrasound imaging. CBD diameter is 2 mm. He has continued RUQ abdominal pain. Lipase is elevated without pancreatic ductal  dilatation or inflammation on CT imaging. -Continue IV fluid resuscitation as above -Continue pain control and antiemetics as needed. His pain worsened on 12/13, changed IV morphine as needed to IV dilaudid as needed. Patient encouraged to alternated oxycodone and Dilaudid for pain control.  -Increased po oxycodone dose to match his home dose of 20mg  po q4 h prn for pain.  -Bilirubin remains elevated, repeat RUQ with findings again concerning for cholecystitis. Of note, General Surgery contacted again on 12/14, recommend medical management as findings in [place] are thought to still be secondary to acute hepatitis.  -Continuing requiring IV narcotic, however, primary pain seems to be chronic back pain now. Weaning IV dilaudid -Of note, NCCSR reviewed, pt had been prescribed suboxone this past fall -Continue to wean narcotics as tolerated  Neutropenia.  -WBC normalized. Likely due to viral infection.  -recheck CBC in AM  Abscess of left upper extremity: -S/p I&D by EDP 11/16/2019. DC vancomycin, start doxycycline.  -Stable at this time  Hypertension: -Currently stable  Chronic paraplegia secondary to spinal cord stroke: -Chronic and unchanged from baseline -Therapy recs for SNF noted, SW following.   Chronic back pain -Chart reviewed. Pt has hx of chronic pain and had been on oxycodone -NCCSR reviewed. Pt was weaned off oxycodone and continued on suboxone from 01/2019 through 07/2019 -Pt thus far had been maintained on oxycodone and IV dialudid while in hospital -Pt displaying behavior consistent with narcotic dependency -Weaning off IV dilaudid -Pt apprehensive about the idea of weaning off narocitcs -Will resume gabapentin although pt does not know his usual home dose. Will start on 400mg  TID. -Will continue flexeril as pt reports it is more effective than zanaflex  Dehydration -Mucus membranes remain somewhat dry.  -Labs  reviewed. Renal function remains stable with Cr of  0.64 -continued IVF at 75cc/hr -Repeat lytes in AM  DVT prophylaxis: SCD's Code Status: Full Family Communication: Pt in room, family not at bedside Disposition Plan: SNF in 24-48hrs  Consultants:   GI  General Surgery  Procedures:   I/D of LUE 12/11  Antimicrobials: Anti-infectives (From admission, onward)   Start     Dose/Rate Route Frequency Ordered Stop   11/19/19 2200  doxycycline (VIBRA-TABS) tablet 100 mg     100 mg Oral Every 12 hours 11/19/19 0933     11/17/19 0800  vancomycin (VANCOCIN) 1,500 mg in sodium chloride 0.9 % 500 mL IVPB  Status:  Discontinued     1,500 mg 250 mL/hr over 120 Minutes Intravenous Every 12 hours 11/16/19 1953 11/19/19 0932   11/17/19 0200  piperacillin-tazobactam (ZOSYN) IVPB 3.375 g  Status:  Discontinued     3.375 g 12.5 mL/hr over 240 Minutes Intravenous Every 8 hours 11/16/19 1953 11/19/19 0932   11/16/19 2000  piperacillin-tazobactam (ZOSYN) IVPB 3.375 g     3.375 g 100 mL/hr over 30 Minutes Intravenous  Once 11/16/19 1953 11/16/19 2125   11/16/19 2000  vancomycin (VANCOCIN) IVPB 1000 mg/200 mL premix     1,000 mg 200 mL/hr over 60 Minutes Intravenous Every 1 hr x 2 11/16/19 1953 11/16/19 2226      Subjective: Very apprehensive about stopping narcotics  Objective: Vitals:   11/25/19 1340 11/25/19 2313 11/26/19 0655 11/26/19 1256  BP: 129/75 138/82 (!) 158/88 133/80  Pulse: 76 82 82 89  Resp: 14 16 17 18   Temp: 98.2 F (36.8 C) 98.1 F (36.7 C) (!) 97.2 F (36.2 C) 98.5 F (36.9 C)  TempSrc: Oral Oral Oral Oral  SpO2: 99% 97% 98% 97%  Weight:      Height:        Intake/Output Summary (Last 24 hours) at 11/26/2019 1753 Last data filed at 11/26/2019 1751 Gross per 24 hour  Intake 240 ml  Output 4250 ml  Net -4010 ml   Filed Weights   11/16/19 1200  Weight: 113.4 kg    Examination: General exam: Conversant, in no acute distress Respiratory system: normal chest rise, clear, no audible wheezing Cardiovascular  system: regular rhythm, s1-s2 Gastrointestinal system: Nondistended, nontender, pos BS Central nervous system: No seizures, no tremors Extremities: No cyanosis, no joint deformities Skin: No rashes, no pallor Psychiatry: Affect normal // no auditory hallucinations   Data Reviewed: I have personally reviewed following labs and imaging studies  CBC: Recent Labs  Lab 11/20/19 0549 11/21/19 0724 11/23/19 0457 11/25/19 0455  WBC 3.8* 4.1 5.4 5.1  NEUTROABS 1.7  --   --   --   HGB 14.9 14.5 13.6 13.4  HCT 45.3 44.0 41.3 40.1  MCV 90.1 88.5 88.6 88.3  PLT 238 290 317 [id number]   Basic Metabolic Panel: Recent Labs  Lab 11/22/19 0638 11/23/19 0457 11/24/19 0549 11/25/19 0455 11/26/19 0442  NA 137 132* 137 137 137  K 4.3 4.2 4.5 4.4 4.2  CL 103 100 105 105 104  CO2 24 25 22 23 25   GLUCOSE 104* 112* 144* 125* 103*  BUN 9 10 9 8 8   CREATININE 0.60* 0.62 0.72 0.64 0.72  CALCIUM 8.8* 8.2* 8.6* 8.6* 8.7*   GFR: Estimated Creatinine Clearance: 126 mL/min (by C-G formula based on SCr of 0.72 mg/dL). Liver Function Tests: Recent Labs  Lab 11/22/19 [id number] 11/23/19 0457 11/24/19 0549 11/25/19 0455 11/26/19 0442  AST 219*  150* 105* 77* 62*  ALT 446* 327* 243* 191* 147*  ALKPHOS 150* 146* 135* 140* 142*  BILITOT 4.8* 4.5* 3.7* 3.3* 2.8*  PROT 7.5 7.3 6.7 6.6 6.8  ALBUMIN 3.0* 3.1* 2.8* 2.9* 2.8*   No results for input(s): LIPASE, AMYLASE in the last 168 hours. No results for input(s): AMMONIA in the last 168 hours. Coagulation Profile: Recent Labs  Lab 11/20/19 0549  INR 1.0   Cardiac Enzymes: No results for input(s): CKTOTAL, CKMB, CKMBINDEX, TROPONINI in the last 168 hours. BNP (last 3 results) No results for input(s): PROBNP in the last 8760 hours. HbA1C: No results for input(s): HGBA1C in the last 72 hours. CBG: No results for input(s): GLUCAP in the last 168 hours. Lipid Profile: No results for input(s): CHOL, HDL, LDLCALC, TRIG, CHOLHDL, LDLDIRECT in the last 72  hours. Thyroid Function Tests: No results for input(s): TSH, T4TOTAL, FREET4, T3FREE, THYROIDAB in the last 72 hours. Anemia Panel: No results for input(s): VITAMINB12, FOLATE, FERRITIN, TIBC, IRON, RETICCTPCT in the last 72 hours. Sepsis Labs: No results for input(s): PROCALCITON, LATICACIDVEN in the last 168 hours.  Recent Results (from the past 240 hour(s))  Culture, blood (routine x 2)     Status: None   Collection Time: 11/16/19  8:15 PM   Specimen: Right Antecubital; Blood  Result Value Ref Range Status   Specimen Description   Final    RIGHT ANTECUBITAL Performed at [facility], [address] [place], [place] [address]    Special Requests   Final    BOTTLES DRAWN AEROBIC AND ANAEROBIC Blood Culture adequate volume Performed at [facility], [address] [place], [place] [address]    Culture   Final    NO GROWTH 5 DAYS Performed at [facility], [place] [address], [place], [place] [address]    Report Status 11/21/2019 FINAL  Final  Culture, blood (routine x 2)     Status: None   Collection Time: 11/16/19  8:20 PM   Specimen: BLOOD RIGHT HAND  Result Value Ref Range Status   Specimen Description   Final    BLOOD RIGHT HAND Performed at [facility], [address] [place], [place] [address]    Special Requests   Final    BOTTLES DRAWN AEROBIC AND ANAEROBIC Blood Culture results may not be optimal due to an inadequate volume of blood received in culture bottles Performed at [facility], [address] [place], [place] [address]    Culture   Final    NO GROWTH 5 DAYS Performed at [facility], [place] [address] [place], [place] [address]    Report Status 11/21/2019 FINAL  Final  MRSA PCR Screening     Status: None   Collection Time: 11/17/19  1:39 AM   Specimen: Nasopharyngeal  Result Value Ref Range Status   MRSA by PCR NEGATIVE NEGATIVE Final    Comment:        The GeneXpert MRSA Assay (FDA approved for  NASAL specimens only), is one component of a comprehensive MRSA colonization surveillance program. It is not intended to diagnose MRSA infection nor to guide or monitor treatment for MRSA infections. Performed at [facility], [place] [address] [place], [place] [address]      Radiology Studies: No results found.  Scheduled Meds: . doxycycline  100 mg Oral Q12H  . gabapentin  400 mg Oral TID  . loratadine  10 mg Oral Daily  . nicotine  14 mg Transdermal Daily  . pantoprazole  40 mg Oral Daily  . sodium chloride flush  10-40 mL Intracatheter Q12H   Continuous Infusions: . sodium chloride 10 mL/hr at 11/18/19 0413  . lactated ringers 75 mL/hr at 11/26/19 1333     LOS: 10 days   Marylu Lund, MD [facility] Pager On Amion  If 7PM-7AM, please contact night-coverage 11/26/2019, 5:53 PM

## 2019-11-27 DIAGNOSIS — I10 Essential (primary) hypertension: Secondary | ICD-10-CM

## 2019-11-27 DIAGNOSIS — R7989 Other specified abnormal findings of blood chemistry: Secondary | ICD-10-CM

## 2019-11-27 LAB — COMPREHENSIVE METABOLIC PANEL
ALT: 114 U/L — ABNORMAL HIGH (ref 0–44)
AST: 51 U/L — ABNORMAL HIGH (ref 15–41)
Albumin: 2.8 g/dL — ABNORMAL LOW (ref 3.5–5.0)
Alkaline Phosphatase: 139 U/L — ABNORMAL HIGH (ref 38–126)
Anion gap: 9 (ref 5–15)
BUN: 8 mg/dL (ref 6–20)
CO2: 25 mmol/L (ref 22–32)
Calcium: 8.8 mg/dL — ABNORMAL LOW (ref 8.9–10.3)
Chloride: 105 mmol/L (ref 98–111)
Creatinine, Ser: 0.63 mg/dL (ref 0.61–1.24)
GFR calc Af Amer: >60 mL/min (ref >60)
GFR calc non Af Amer: >60 mL/min (ref >60)
Glucose, Bld: 139 mg/dL — ABNORMAL HIGH (ref 70–99)
Potassium: 4.4 mmol/L (ref 3.5–5.1)
Sodium: 139 mmol/L (ref 135–145)
Total Bilirubin: 2.3 mg/dL — ABNORMAL HIGH (ref 0.3–1.2)
Total Protein: 6.8 g/dL (ref 6.5–8.1)

## 2019-11-27 NOTE — TOC Progression Note (Signed)
Transition of Care [facility]) - Progression Note    Patient Details  Name: Ronnie Moore MRN: [id number] Date of Birth: 1961/04/17  Transition [facility]) CM/SW Contact  Morganna Styles, Marjie Skiff, RN Phone Number: 11/27/2019, 3:52 PM  Clinical Narrative:    [name] [id number]. TOC working on finding snf bed for pt. FL2 re-sent out to multiple facilities   Expected Discharge Plan: [place] Barriers to Discharge: Continued Medical Work up  Expected Discharge Plan and Services Expected Discharge Plan: [place] arrangements for the past 2 months: Apartment                                       Social Determinants of Health (SDOH) Interventions    Readmission Risk Interventions No flowsheet data found.

## 2019-11-27 NOTE — Progress Notes (Signed)
PROGRESS NOTE    BEAUMONT WHITENIGHT  [id number] DOB: September 04, 1961 DOA: 11/16/2019 PCP: Patient, No Pcp Per    Brief Narrative:  58 y.o.malewith medical history significant forparaplegia due to spinal cord stroke, wheelchair-bound, hypertension, and chronic back pain who presents to the ED for evaluation of an abscess to his left upper extremity. He presented to the ED with nausea, vomiting, left upper arm abscess. He underwent I&D of the abscess, was started on IV vancomycin. Was found to have elevated LFTs. CT abd/pelvis with suspicion for acute cholecystitis without evidence of gallstone. Prominent mildly enlarged periceliac gastrohepatic ligament lymph nodes are noted.  RUQ ultrasound showed changes consistent with a calculus cholecystitis, CBD diameter 2 mm. Patient started on IV Zosyn.   Positive for acute hepatitis A. Surgery consulted, felt like CT and US findings were secondary to hepatitis. GI consulted and following.   Assessment & Plan:   Principal Problem:   Transaminitis Active Problems:   Essential hypertension   Abscess of left upper extremity   Acalculous cholecystitis   Acute hepatitis A virus infection  Transaminitis in setting of acute hepatitis A viral infection: -Hepatitis A IgM is reactive.  -Acute hepatitis is likely cause of his transaminitis. Serum acetaminophen level is undetectable. Will continue supportive care. -Continue IV fluid hydration -Hold Tylenol -Hep C negative--patient reports being treated for it in the past -Appreciate input by GI. Recommendation for continued supportive care -Infection control had requested that they report this case of hepatitis A to the Health Dept.  -LFT's continuing to trend down. Repeat CMP in AM  Acalculous cholecystitis ruled out: Without evidence of gallstones on CT or ultrasound imaging. CBD diameter is 2 mm. He has continued RUQ abdominal pain. Lipase is elevated without pancreatic ductal dilatation  or inflammation on CT imaging. -Continue IV fluid resuscitation as above -Continue pain control and antiemetics as needed. His pain worsened on 12/13, changed IV morphine as needed to IV dilaudid as needed. Patient encouraged to alternated oxycodone and Dilaudid for pain control.  -Increased po oxycodone dose to match his home dose of 20mg  po q4 h prn for pain.  -Bilirubin remains elevated, repeat RUQ with findings again concerning for cholecystitis. Of note, General Surgery contacted again on 12/14, recommend medical management as findings in [place] are thought to still be secondary to acute hepatitis.  -Continuing requiring IV narcotic, however, primary pain seems to be chronic back pain now. Weaning IV dilaudid -Of note, NCCSR reviewed, pt had been prescribed suboxone this past fall -Continue to wean narcotics as tolerated, see below  Neutropenia.  -WBC normalized. Likely due to viral infection.  -repeat CBC in AM  Abscess of left upper extremity: -S/p I&D by EDP 11/16/2019. DC vancomycin, start doxycycline.  -Stable at this time  Hypertension: -Currently stable  Chronic paraplegia secondary to spinal cord stroke: -Chronic and unchanged from baseline -Therapy recs for SNF noted, SW following.   Chronic back pain -Chart reviewed. Pt has hx of chronic pain and had been on oxycodone -NCCSR reviewed. Pt was weaned off oxycodone and continued on suboxone from 01/2019 through 07/2019 -Pt thus far had been maintained on oxycodone and IV dialudid while in hospital -Pt displaying behavior consistent with narcotic dependency -Weaning IV dilaudid -Pt remains apprehensive about the idea of weaning off narocitcs -Will resume gabapentin although pt does not know his usual home dose. Will cont on 400mg  TID. -Will continue flexeril as pt reports it is more effective than zanaflex  Dehydration -Recently noted to have dry  mucus membranes and decreased urine output despite unremarkable renal  function  -Labs reviewed. Renal function remains stable with Cr of 0.63 -Pt had received IVF at 75cc/hr -Very good urine output noted. Will hold further IVF  DVT prophylaxis: SCD's Code Status: Full Family Communication: Pt in room, family not at bedside Disposition Plan: SNF in 24-48hrs  Consultants:   GI  General Surgery  Procedures:   I/D of LUE 12/11  Antimicrobials: Anti-infectives (From admission, onward)   Start     Dose/Rate Route Frequency Ordered Stop   11/19/19 2200  doxycycline (VIBRA-TABS) tablet 100 mg     100 mg Oral Every 12 hours 11/19/19 0933     11/17/19 0800  vancomycin (VANCOCIN) 1,500 mg in sodium chloride 0.9 % 500 mL IVPB  Status:  Discontinued     1,500 mg 250 mL/hr over 120 Minutes Intravenous Every 12 hours 11/16/19 1953 11/19/19 0932   11/17/19 0200  piperacillin-tazobactam (ZOSYN) IVPB 3.375 g  Status:  Discontinued     3.375 g 12.5 mL/hr over 240 Minutes Intravenous Every 8 hours 11/16/19 1953 11/19/19 0932   11/16/19 2000  piperacillin-tazobactam (ZOSYN) IVPB 3.375 g     3.375 g 100 mL/hr over 30 Minutes Intravenous  Once 11/16/19 1953 11/16/19 2125   11/16/19 2000  vancomycin (VANCOCIN) IVPB 1000 mg/200 mL premix     1,000 mg 200 mL/hr over 60 Minutes Intravenous Every 1 hr x 2 11/16/19 1953 11/16/19 2226      Subjective: Remains apprehensive about stopping narcotics  Objective: Vitals:   11/26/19 1256 11/26/19 2106 11/27/19 0600 11/27/19 1248  BP: 133/80 114/67 124/67 138/77  Pulse: 89 87 77 80  Resp: 18 16 16 20   Temp: 98.5 F (36.9 C) 99.2 F (37.3 C) 98.4 F (36.9 C) 97.6 F (36.4 C)  TempSrc: Oral Oral Oral Oral  SpO2: 97% 94% 98% 97%  Weight:      Height:        Intake/Output Summary (Last 24 hours) at 11/27/2019 1456 Last data filed at 11/27/2019 1400 Gross per 24 hour  Intake 480 ml  Output 4550 ml  Net -4070 ml   Filed Weights   11/16/19 1200  Weight: 113.4 kg    Examination: General exam: Awake, laying  in bed, in nad Respiratory system: Normal respiratory effort, no wheezing Cardiovascular system: regular rate, s1, s2 Gastrointestinal system: Soft, nondistended, positive BS Central nervous system: CN2-12 grossly intact, strength intact Extremities: Perfused, no clubbing Skin: Normal skin turgor, no notable skin lesions seen Psychiatry: Mood normal // no visual hallucinations   Data Reviewed: I have personally reviewed following labs and imaging studies  CBC: Recent Labs  Lab 11/21/19 0724 11/23/19 0457 11/25/19 0455  WBC 4.1 5.4 5.1  HGB 14.5 13.6 13.4  HCT 44.0 41.3 40.1  MCV 88.5 88.6 88.3  PLT 290 317 [id number]   Basic Metabolic Panel: Recent Labs  Lab 11/23/19 0457 11/24/19 0549 11/25/19 0455 11/26/19 0442 11/27/19 0541  NA 132* 137 137 137 139  K 4.2 4.5 4.4 4.2 4.4  CL 100 105 105 104 105  CO2 25 22 23 25 25   GLUCOSE 112* 144* 125* 103* 139*  BUN 10 9 8 8 8   CREATININE 0.62 0.72 0.64 0.72 0.63  CALCIUM 8.2* 8.6* 8.6* 8.7* 8.8*   GFR: Estimated Creatinine Clearance: 126 mL/min (by C-G formula based on SCr of 0.63 mg/dL). Liver Function Tests: Recent Labs  Lab 11/23/19 0457 11/24/19 0549 11/25/19 0455 11/26/19 0442 11/27/19 0541  AST  150* 105* 77* 62* 51*  ALT 327* 243* 191* 147* 114*  ALKPHOS 146* 135* 140* 142* 139*  BILITOT 4.5* 3.7* 3.3* 2.8* 2.3*  PROT 7.3 6.7 6.6 6.8 6.8  ALBUMIN 3.1* 2.8* 2.9* 2.8* 2.8*   No results for input(s): LIPASE, AMYLASE in the last 168 hours. No results for input(s): AMMONIA in the last 168 hours. Coagulation Profile: No results for input(s): INR, PROTIME in the last 168 hours. Cardiac Enzymes: No results for input(s): CKTOTAL, CKMB, CKMBINDEX, TROPONINI in the last 168 hours. BNP (last 3 results) No results for input(s): PROBNP in the last 8760 hours. HbA1C: No results for input(s): HGBA1C in the last 72 hours. CBG: No results for input(s): GLUCAP in the last 168 hours. Lipid Profile: No results for input(s):  CHOL, HDL, LDLCALC, TRIG, CHOLHDL, LDLDIRECT in the last 72 hours. Thyroid Function Tests: No results for input(s): TSH, T4TOTAL, FREET4, T3FREE, THYROIDAB in the last 72 hours. Anemia Panel: No results for input(s): VITAMINB12, FOLATE, FERRITIN, TIBC, IRON, RETICCTPCT in the last 72 hours. Sepsis Labs: No results for input(s): PROCALCITON, LATICACIDVEN in the last 168 hours.  Recent Results (from the past 240 hour(s))  SARS CORONAVIRUS 2 (TAT 6-24 HRS) Nasopharyngeal Nasopharyngeal Swab     Status: None   Collection Time: 11/26/19 12:14 PM   Specimen: Nasopharyngeal Swab  Result Value Ref Range Status   SARS Coronavirus 2 NEGATIVE NEGATIVE Final    Comment: (NOTE) SARS-CoV-2 target nucleic acids are NOT DETECTED. The SARS-CoV-2 RNA is generally detectable in upper and lower respiratory specimens during the acute phase of infection. Negative results do not preclude SARS-CoV-2 infection, do not rule out co-infections with other pathogens, and should not be used as the sole basis for treatment or other patient management decisions. Negative results must be combined with clinical observations, patient history, and epidemiological information. The expected result is Negative. Fact Sheet for Patients: SugarRoll.be Fact Sheet for Healthcare Providers: https://www.woods-mathews.com/ This test is not yet approved or cleared by the [place] FDA and  has been authorized for detection and/or diagnosis of SARS-CoV-2 by FDA under an Emergency Use Authorization (EUA). This EUA will remain  in effect (meaning this test can be used) for the duration of the COVID-19 declaration under Section 56 4(b)(1) of the Act, 21 U.S.C. section 360bbb-3(b)(1), unless the authorization is terminated or revoked sooner. Performed at [facility], [place] [address] [place], [place] [address]      Radiology Studies: No results found.  Scheduled Meds: .  doxycycline  100 mg Oral Q12H  . gabapentin  400 mg Oral TID  . loratadine  10 mg Oral Daily  . nicotine  14 mg Transdermal Daily  . pantoprazole  40 mg Oral Daily  . sodium chloride flush  10-40 mL Intracatheter Q12H   Continuous Infusions: . sodium chloride 10 mL/hr at 11/18/19 0413     LOS: 11 days   Marylu Lund, MD [facility] Pager On Amion  If 7PM-7AM, please contact night-coverage 11/27/2019, 2:56 PM

## 2019-11-28 DIAGNOSIS — M5489 Other dorsalgia: Secondary | ICD-10-CM | POA: Diagnosis not present

## 2019-11-28 DIAGNOSIS — Z7401 Bed confinement status: Secondary | ICD-10-CM | POA: Diagnosis not present

## 2019-11-28 DIAGNOSIS — R7401 Elevation of levels of liver transaminase levels: Secondary | ICD-10-CM | POA: Diagnosis not present

## 2019-11-28 DIAGNOSIS — G8220 Paraplegia, unspecified: Secondary | ICD-10-CM | POA: Diagnosis not present

## 2019-11-28 DIAGNOSIS — Z23 Encounter for immunization: Secondary | ICD-10-CM | POA: Diagnosis not present

## 2019-11-28 DIAGNOSIS — K819 Cholecystitis, unspecified: Secondary | ICD-10-CM | POA: Diagnosis not present

## 2019-11-28 DIAGNOSIS — R296 Repeated falls: Secondary | ICD-10-CM | POA: Diagnosis not present

## 2019-11-28 DIAGNOSIS — L988 Other specified disorders of the skin and subcutaneous tissue: Secondary | ICD-10-CM | POA: Diagnosis not present

## 2019-11-28 DIAGNOSIS — Z743 Need for continuous supervision: Secondary | ICD-10-CM | POA: Diagnosis not present

## 2019-11-28 DIAGNOSIS — M2550 Pain in unspecified joint: Secondary | ICD-10-CM | POA: Diagnosis not present

## 2019-11-28 DIAGNOSIS — L02414 Cutaneous abscess of left upper limb: Secondary | ICD-10-CM | POA: Diagnosis not present

## 2019-11-28 DIAGNOSIS — Z789 Other specified health status: Secondary | ICD-10-CM | POA: Diagnosis not present

## 2019-11-28 DIAGNOSIS — M6281 Muscle weakness (generalized): Secondary | ICD-10-CM | POA: Diagnosis not present

## 2019-11-28 DIAGNOSIS — G8929 Other chronic pain: Secondary | ICD-10-CM | POA: Diagnosis not present

## 2019-11-28 DIAGNOSIS — R5383 Other fatigue: Secondary | ICD-10-CM | POA: Diagnosis not present

## 2019-11-28 DIAGNOSIS — L98419 Non-pressure chronic ulcer of buttock with unspecified severity: Secondary | ICD-10-CM | POA: Diagnosis not present

## 2019-11-28 DIAGNOSIS — M545 Low back pain: Secondary | ICD-10-CM | POA: Diagnosis not present

## 2019-11-28 DIAGNOSIS — R52 Pain, unspecified: Secondary | ICD-10-CM | POA: Diagnosis not present

## 2019-11-28 DIAGNOSIS — L98499 Non-pressure chronic ulcer of skin of other sites with unspecified severity: Secondary | ICD-10-CM | POA: Diagnosis not present

## 2019-11-28 DIAGNOSIS — R197 Diarrhea, unspecified: Secondary | ICD-10-CM | POA: Diagnosis not present

## 2019-11-28 DIAGNOSIS — E7800 Pure hypercholesterolemia, unspecified: Secondary | ICD-10-CM | POA: Diagnosis not present

## 2019-11-28 DIAGNOSIS — W19XXXA Unspecified fall, initial encounter: Secondary | ICD-10-CM | POA: Diagnosis not present

## 2019-11-28 DIAGNOSIS — G4700 Insomnia, unspecified: Secondary | ICD-10-CM | POA: Diagnosis not present

## 2019-11-28 DIAGNOSIS — R600 Localized edema: Secondary | ICD-10-CM | POA: Diagnosis not present

## 2019-11-28 DIAGNOSIS — I10 Essential (primary) hypertension: Secondary | ICD-10-CM | POA: Diagnosis not present

## 2019-11-28 LAB — COMPREHENSIVE METABOLIC PANEL
ALT: 94 U/L — ABNORMAL HIGH (ref 0–44)
AST: 40 U/L (ref 15–41)
Albumin: 2.7 g/dL — ABNORMAL LOW (ref 3.5–5.0)
Alkaline Phosphatase: 130 U/L — ABNORMAL HIGH (ref 38–126)
Anion gap: 8 (ref 5–15)
BUN: 9 mg/dL (ref 6–20)
CO2: 26 mmol/L (ref 22–32)
Calcium: 8.8 mg/dL — ABNORMAL LOW (ref 8.9–10.3)
Chloride: 105 mmol/L (ref 98–111)
Creatinine, Ser: 0.67 mg/dL (ref 0.61–1.24)
GFR calc Af Amer: >60 mL/min (ref >60)
GFR calc non Af Amer: >60 mL/min (ref >60)
Glucose, Bld: 161 mg/dL — ABNORMAL HIGH (ref 70–99)
Potassium: 4.2 mmol/L (ref 3.5–5.1)
Sodium: 139 mmol/L (ref 135–145)
Total Bilirubin: 1.9 mg/dL — ABNORMAL HIGH (ref 0.3–1.2)
Total Protein: 6.7 g/dL (ref 6.5–8.1)

## 2019-11-28 LAB — CBC
HCT: 37.1 % — ABNORMAL LOW (ref 39.0–52.0)
Hemoglobin: 12 g/dL — ABNORMAL LOW (ref 13.0–17.0)
MCH: 29.6 pg (ref 26.0–34.0)
MCHC: 32.3 g/dL (ref 30.0–36.0)
MCV: 91.6 fL (ref 80.0–100.0)
Platelets: 295 10*3/uL (ref 150–400)
RBC: 4.05 MIL/uL — ABNORMAL LOW (ref 4.22–5.81)
RDW: 16.8 % — ABNORMAL HIGH (ref 11.5–15.5)
WBC: 5.9 10*3/uL (ref 4.0–10.5)
nRBC: 0 % (ref 0.0–0.2)

## 2019-11-28 MED ORDER — SALINE SPRAY 0.65 % NA SOLN: 1.0000 | NASAL | 0 refills | Status: DC | PRN

## 2019-11-28 MED ORDER — DOXYCYCLINE HYCLATE 100 MG PO TABS: 100.0000 mg | ORAL_TABLET | Freq: Two times a day (BID) | ORAL | 0 refills | Status: AC

## 2019-11-28 MED ORDER — OXYCODONE HCL 5 MG PO TABS: 5.0000 mg | ORAL_TABLET | Freq: Four times a day (QID) | ORAL | 0 refills | Status: AC | PRN

## 2019-11-28 MED ORDER — GABAPENTIN 400 MG PO CAPS: 400.0000 mg | ORAL_CAPSULE | Freq: Three times a day (TID) | ORAL | 0 refills | Status: AC

## 2019-11-28 MED ORDER — NICOTINE 14 MG/24HR TD PT24: 14.0000 mg | MEDICATED_PATCH | Freq: Every day | TRANSDERMAL | 0 refills | Status: DC

## 2019-11-28 MED ORDER — LOSARTAN POTASSIUM 100 MG PO TABS: 50.0000 mg | ORAL_TABLET | Freq: Every day | ORAL | 0 refills | Status: DC

## 2019-11-28 MED ORDER — CYCLOBENZAPRINE HCL 7.5 MG PO TABS: 7.5000 mg | ORAL_TABLET | Freq: Three times a day (TID) | ORAL | 0 refills | Status: AC | PRN

## 2019-11-28 MED ORDER — LORATADINE 10 MG PO TABS: 10.0000 mg | ORAL_TABLET | Freq: Every day | ORAL | 0 refills | Status: DC

## 2019-11-28 NOTE — Progress Notes (Signed)
Attempted to call report. No response after being put on hold will attempt to call back.

## 2019-11-28 NOTE — Discharge Summary (Addendum)
Physician Discharge Summary  Patient ID: Ronnie Moore MRN: [id number] DOB/AGE: 1961/04/30 58 y.o.  Admit date: 11/16/2019 Discharge date: 11/28/2019  Admission Diagnoses: Transaminitis Discharge Diagnoses:  Principal Problem:   Transaminitis Active Problems:   Essential hypertension   Abscess of left upper extremity   Acalculous cholecystitis   Acute hepatitis A virus infection   Discharged Condition: fair  Hospital Course:  58 y.o.malewith medical history significant forparaplegia due to spinal cord stroke, wheelchair-bound, hypertension, and chronic back pain who presents to the ED for evaluation of an abscess to his left upper extremity. He presented to the ED with nausea, vomiting, left upper arm abscess. He underwent I&D of the abscess, was started on IV vancomycin. Was found to have elevated LFTs. CT abd/pelvis with suspicion for acute cholecystitis without evidence of gallstone. Prominent mildly enlarged periceliac gastrohepatic ligament lymph nodes are noted. RUQ ultrasound showed changes consistent with a calculus cholecystitis, CBD diameter 2 mm.Patient started on IV Zosyn.  Positive for acute hepatitis A. Surgery consulted, felt like CT and US findings were secondary to hepatitis. GI consulted. No acute intervention.  Transaminitis in setting of acute hepatitis A viral infection: -Hepatitis A IgM is reactive.  -Acute hepatitis is likely cause of his transaminitis. Serum acetaminophen level is undetectable. Will continue supportive care. -Continue IV fluid hydration -Hold Tylenol -Hep C negative--patient reports being treated for it in the past -Appreciate input by GI. Recommendation for continued supportive care - OP f/u with PCP and GI  -Infection control had requested that they report this case of hepatitis A to the Health Dept.  -LFT's continuing to trend down. Close to normal  Now   Acalculous cholecystitisruled out: Without evidence of gallstones on CT  or ultrasound imaging. CBD diameter is 2 mm. He has continued RUQ abdominal pain. Lipase is elevated without pancreatic ductal dilatation or inflammation on CT imaging. -Cont to encourage PO hydration  -Continue pain control and antiemetics as needed.  Patient encouraged to decrease oxycodone need -Bilirubin remains elevated, repeat RUQ with findings again concerning for cholecystitis.Of note,General Surgery contacted again on 12/14, recommend medical management as findings in [place] are thought to still be secondary to acute hepatitis.  - Was requiring IV narcotic, however, primary pain seems to be chronic back pain now. -Of note, NCCSR reviewed, pt had been prescribed suboxone this past fall -Continue to wean narcotics as tolerated, see below  Neutropenia.  -WBC normalized.    Abscess of left upper extremity: -S/p I&D by EDP 11/16/2019. DC vancomycin, started doxycycline. Rx for 7 more days -Stable at this time  Hypertension: -Currently stable  Chronic paraplegia secondary to spinal cord stroke: -Chronic and unchanged from baseline -Therapy recs for SNF noted  - SNF PT OT recs   Chronic back pain -Chart reviewed. Pt has hx of chronic pain and had been on oxycodone -NCCSR reviewed. Pt was weaned off oxycodone and continued on suboxone from 01/2019 through 07/2019 -Pt  had been maintained on oxycodone and IV dialudid while in hospital. -Pt displaying behavior consistent with narcotic dependency -Pt remains apprehensive about the idea of weaning off narocitcs - Started gabapentin although pt does not know his usual home dose. Will cont on 400mg  TID. -Will continue flexeril as pt reports it is more effective than zanaflex - Oxycodone with dec dose and freq for now; gradual wean off of narcotics   Dehydration -Recently noted to have dry mucus membranes and decreased urine output despite unremarkable renal function  -Labs reviewed. Renal function remains stable  with Cr of  0.63 -Pt had received IVF at 75cc/hr -Very good urine output noted. Will hold further IVF - Encourage PO hydration   Consults: GI, Surgery   Significant Diagnostic Studies: Radiological imaging and blood works   Treatments: As per hospital course and discharge med list   Discharge Exam: Blood pressure 120/73, pulse 76, temperature 98.1 F (36.7 C), temperature source Oral, resp. rate 20, height 5\' 8"  (1.727 m), weight 113.4 kg, SpO2 98 %.  General exam: Awake, laying in bed, in nad Respiratory system: Normal respiratory effort, no wheezing Cardiovascular system: regular rate, s1, s2 Gastrointestinal system: Soft, nondistended, positive BS Central nervous system: CN2-12 grossly intact, strength intact Extremities: Perfused, no clubbing Skin: Normal skin turgor, no notable skin lesions seen Psychiatry: Mood normal // no visual hallucinations   Disposition: Discharge disposition: 03-Skilled Nursing Facility      Discharge Instructions    Call MD for:  difficulty breathing, headache or visual disturbances   Complete by: As directed    Call MD for:  persistant dizziness or light-headedness   Complete by: As directed    Call MD for:  redness, tenderness, or signs of infection (pain, swelling, redness, odor or green/yellow discharge around incision site)   Complete by: As directed    Diet - low sodium heart healthy   Complete by: As directed    Diet Carb Modified   Complete by: As directed    Walk with assistance   Complete by: As directed    Per PT/OT     Allergies as of 11/28/2019      Reactions   Metaxalone Swelling   Reaction to Skelaxin - lips swelled      Medication List    STOP taking these medications   gabapentin 600 MG tablet Commonly known as: NEURONTIN Replaced by: gabapentin 400 MG capsule   nicotine 21 mg/24hr patch Commonly known as: NICODERM CQ - dosed in mg/24 hours Replaced by: nicotine 14 mg/24hr patch   sulfamethoxazole-trimethoprim 800-160  MG tablet Commonly known as: BACTRIM DS   tizanidine 6 MG capsule Commonly known as: ZANAFLEX     TAKE these medications   amLODipine 5 MG tablet Commonly known as: NORVASC Take 5 mg by mouth daily.   cyclobenzaprine 7.5 MG tablet Commonly known as: FEXMID Take 1 tablet (7.5 mg total) by mouth 3 (three) times daily as needed for muscle spasms.   docusate sodium 50 MG capsule Commonly known as: COLACE Take 1 capsule (50 mg total) by mouth 2 (two) times daily.   doxycycline 100 MG tablet Commonly known as: VIBRA-TABS Take 1 tablet (100 mg total) by mouth every 12 (twelve) hours for 7 days.   DULoxetine 60 MG capsule Commonly known as: CYMBALTA Take 120 mg by mouth at bedtime.   gabapentin 400 MG capsule Commonly known as: NEURONTIN Take 1 capsule (400 mg total) by mouth 3 (three) times daily. Replaces: gabapentin 600 MG tablet   hydrochlorothiazide 25 MG tablet Commonly known as: HYDRODIURIL Take 25 mg by mouth daily.   loratadine 10 MG tablet Commonly known as: CLARITIN Take 1 tablet (10 mg total) by mouth daily. Start taking on: November 29, 2019   LORazepam 2 MG tablet Commonly known as: ATIVAN Take 1 tablet (2 mg total) by mouth 3 (three) times daily.   losartan 100 MG tablet Commonly known as: COZAAR Take 0.5 tablets (50 mg total) by mouth daily. What changed: how much to take   meloxicam 15 MG tablet Commonly known as:  MOBIC Take 1 tablet (15 mg total) by mouth daily.   nicotine 14 mg/24hr patch Commonly known as: NICODERM CQ - dosed in mg/24 hours Place 1 patch (14 mg total) onto the skin daily. Start taking on: November 29, 2019 Replaces: nicotine 21 mg/24hr patch   oxyCODONE 5 MG immediate release tablet Commonly known as: Roxicodone Take 1 tablet (5 mg total) by mouth every 6 (six) hours as needed for severe pain. Hold & Call MD if SBP<90, HR<65, RR<10, O2<90, or altered mental status What changed:   medication strength  how much to  take  when to take this  reasons to take this  additional instructions   pantoprazole 40 MG tablet Commonly known as: PROTONIX Take 1 tablet (40 mg total) by mouth daily.   polyethylene glycol 17 g packet Commonly known as: MIRALAX / GLYCOLAX Take 17 g by mouth 2 (two) times daily.   potassium chloride 20 MEQ/15ML (10%) Soln Take 30 mLs (40 mEq total) by mouth daily.   sodium chloride 0.65 % Soln nasal spray Commonly known as: OCEAN Place 1 spray into both nostrils as needed for congestion.   temazepam 15 MG capsule Commonly known as: RESTORIL Take 1 capsule (15 mg total) by mouth at bedtime as needed for sleep.   Tribenzor 40-10-25 MG Tabs Generic drug: Olmesartan-amLODIPine-HCTZ Take 1 tablet by mouth daily.        Signed: Thornell Mule 11/28/2019, 11:18 AM

## 2019-11-28 NOTE — Progress Notes (Signed)
Report called to [place] at [employer]. Patient transported via [place].

## 2019-11-28 NOTE — TOC Transition Note (Signed)
Transition of Care [facility]) - CM/SW Discharge Note   Patient Details  Name: Ronnie Moore MRN: [id number] Date of Birth: [birth year]  [facility] CM/SW Contact:  Lynnell Catalan, RN Phone Number: 11/28/2019, 9:57 AM   Clinical Narrative:     Pt received a bed at [facility] for today. He will go to room 102. RN to call report to [phone]. [facility] will be set up for transport after lunch.

## 2019-11-29 DIAGNOSIS — L02414 Cutaneous abscess of left upper limb: Secondary | ICD-10-CM | POA: Diagnosis not present

## 2019-11-29 DIAGNOSIS — K819 Cholecystitis, unspecified: Secondary | ICD-10-CM | POA: Diagnosis not present

## 2019-11-29 DIAGNOSIS — I10 Essential (primary) hypertension: Secondary | ICD-10-CM | POA: Diagnosis not present

## 2019-12-03 DIAGNOSIS — G4700 Insomnia, unspecified: Secondary | ICD-10-CM | POA: Diagnosis not present

## 2019-12-03 DIAGNOSIS — M6281 Muscle weakness (generalized): Secondary | ICD-10-CM | POA: Diagnosis not present

## 2019-12-03 DIAGNOSIS — M545 Low back pain: Secondary | ICD-10-CM | POA: Diagnosis not present

## 2019-12-03 DIAGNOSIS — G8929 Other chronic pain: Secondary | ICD-10-CM | POA: Diagnosis not present

## 2019-12-04 DIAGNOSIS — M545 Low back pain: Secondary | ICD-10-CM | POA: Diagnosis not present

## 2019-12-04 DIAGNOSIS — M6281 Muscle weakness (generalized): Secondary | ICD-10-CM | POA: Diagnosis not present

## 2019-12-04 DIAGNOSIS — G8929 Other chronic pain: Secondary | ICD-10-CM | POA: Diagnosis not present

## 2019-12-04 DIAGNOSIS — G4700 Insomnia, unspecified: Secondary | ICD-10-CM | POA: Diagnosis not present

## 2019-12-06 DIAGNOSIS — G8929 Other chronic pain: Secondary | ICD-10-CM | POA: Diagnosis not present

## 2019-12-06 DIAGNOSIS — R52 Pain, unspecified: Secondary | ICD-10-CM | POA: Diagnosis not present

## 2019-12-06 DIAGNOSIS — R5383 Other fatigue: Secondary | ICD-10-CM | POA: Diagnosis not present

## 2019-12-06 DIAGNOSIS — M6281 Muscle weakness (generalized): Secondary | ICD-10-CM | POA: Diagnosis not present

## 2019-12-10 DIAGNOSIS — M6281 Muscle weakness (generalized): Secondary | ICD-10-CM | POA: Diagnosis not present

## 2019-12-10 DIAGNOSIS — G8929 Other chronic pain: Secondary | ICD-10-CM | POA: Diagnosis not present

## 2019-12-10 DIAGNOSIS — R5383 Other fatigue: Secondary | ICD-10-CM | POA: Diagnosis not present

## 2019-12-13 DIAGNOSIS — W19XXXA Unspecified fall, initial encounter: Secondary | ICD-10-CM | POA: Diagnosis not present

## 2019-12-13 DIAGNOSIS — M6281 Muscle weakness (generalized): Secondary | ICD-10-CM | POA: Diagnosis not present

## 2019-12-13 DIAGNOSIS — G8929 Other chronic pain: Secondary | ICD-10-CM | POA: Diagnosis not present

## 2019-12-14 DIAGNOSIS — L98419 Non-pressure chronic ulcer of buttock with unspecified severity: Secondary | ICD-10-CM | POA: Diagnosis not present

## 2019-12-19 DIAGNOSIS — W19XXXA Unspecified fall, initial encounter: Secondary | ICD-10-CM | POA: Diagnosis not present

## 2019-12-19 DIAGNOSIS — M6281 Muscle weakness (generalized): Secondary | ICD-10-CM | POA: Diagnosis not present

## 2019-12-20 DIAGNOSIS — L98499 Non-pressure chronic ulcer of skin of other sites with unspecified severity: Secondary | ICD-10-CM | POA: Diagnosis not present

## 2019-12-21 DIAGNOSIS — M6281 Muscle weakness (generalized): Secondary | ICD-10-CM | POA: Diagnosis not present

## 2019-12-21 DIAGNOSIS — G8929 Other chronic pain: Secondary | ICD-10-CM | POA: Diagnosis not present

## 2019-12-24 DIAGNOSIS — G8929 Other chronic pain: Secondary | ICD-10-CM | POA: Diagnosis not present

## 2019-12-24 DIAGNOSIS — M6281 Muscle weakness (generalized): Secondary | ICD-10-CM | POA: Diagnosis not present

## 2019-12-26 DIAGNOSIS — Z789 Other specified health status: Secondary | ICD-10-CM | POA: Diagnosis not present

## 2019-12-26 DIAGNOSIS — E7800 Pure hypercholesterolemia, unspecified: Secondary | ICD-10-CM | POA: Diagnosis not present

## 2019-12-26 DIAGNOSIS — I10 Essential (primary) hypertension: Secondary | ICD-10-CM | POA: Diagnosis not present

## 2019-12-27 DIAGNOSIS — L98499 Non-pressure chronic ulcer of skin of other sites with unspecified severity: Secondary | ICD-10-CM | POA: Diagnosis not present

## 2020-01-01 DIAGNOSIS — R296 Repeated falls: Secondary | ICD-10-CM | POA: Diagnosis not present

## 2020-01-01 DIAGNOSIS — W19XXXA Unspecified fall, initial encounter: Secondary | ICD-10-CM | POA: Diagnosis not present

## 2020-01-01 DIAGNOSIS — G8929 Other chronic pain: Secondary | ICD-10-CM | POA: Diagnosis not present

## 2020-01-01 DIAGNOSIS — M545 Low back pain: Secondary | ICD-10-CM | POA: Diagnosis not present

## 2020-01-01 DIAGNOSIS — M6281 Muscle weakness (generalized): Secondary | ICD-10-CM | POA: Diagnosis not present

## 2020-01-03 DIAGNOSIS — R197 Diarrhea, unspecified: Secondary | ICD-10-CM | POA: Diagnosis not present

## 2020-01-03 DIAGNOSIS — L98499 Non-pressure chronic ulcer of skin of other sites with unspecified severity: Secondary | ICD-10-CM | POA: Diagnosis not present

## 2020-01-03 DIAGNOSIS — R600 Localized edema: Secondary | ICD-10-CM | POA: Diagnosis not present

## 2020-01-03 DIAGNOSIS — G8929 Other chronic pain: Secondary | ICD-10-CM | POA: Diagnosis not present

## 2020-01-04 DIAGNOSIS — R600 Localized edema: Secondary | ICD-10-CM | POA: Diagnosis not present

## 2020-01-04 DIAGNOSIS — G8929 Other chronic pain: Secondary | ICD-10-CM | POA: Diagnosis not present

## 2020-01-04 DIAGNOSIS — M6281 Muscle weakness (generalized): Secondary | ICD-10-CM | POA: Diagnosis not present

## 2020-01-04 DIAGNOSIS — R7401 Elevation of levels of liver transaminase levels: Secondary | ICD-10-CM | POA: Diagnosis not present

## 2020-01-04 DIAGNOSIS — I10 Essential (primary) hypertension: Secondary | ICD-10-CM | POA: Diagnosis not present

## 2020-01-08 DIAGNOSIS — L02414 Cutaneous abscess of left upper limb: Secondary | ICD-10-CM | POA: Diagnosis not present

## 2020-01-08 DIAGNOSIS — I10 Essential (primary) hypertension: Secondary | ICD-10-CM | POA: Diagnosis not present

## 2020-01-08 DIAGNOSIS — K819 Cholecystitis, unspecified: Secondary | ICD-10-CM | POA: Diagnosis not present

## 2020-01-08 DIAGNOSIS — R7401 Elevation of levels of liver transaminase levels: Secondary | ICD-10-CM | POA: Diagnosis not present

## 2020-01-08 DIAGNOSIS — G8929 Other chronic pain: Secondary | ICD-10-CM | POA: Diagnosis not present

## 2020-01-08 DIAGNOSIS — M6281 Muscle weakness (generalized): Secondary | ICD-10-CM | POA: Diagnosis not present

## 2020-01-08 DIAGNOSIS — G8220 Paraplegia, unspecified: Secondary | ICD-10-CM | POA: Diagnosis not present

## 2020-01-10 DIAGNOSIS — G894 Chronic pain syndrome: Secondary | ICD-10-CM | POA: Diagnosis not present

## 2020-01-10 DIAGNOSIS — M6281 Muscle weakness (generalized): Secondary | ICD-10-CM | POA: Diagnosis not present

## 2020-01-10 DIAGNOSIS — G8220 Paraplegia, unspecified: Secondary | ICD-10-CM | POA: Diagnosis not present

## 2020-01-10 DIAGNOSIS — L02414 Cutaneous abscess of left upper limb: Secondary | ICD-10-CM | POA: Diagnosis not present

## 2020-01-10 DIAGNOSIS — Z993 Dependence on wheelchair: Secondary | ICD-10-CM | POA: Diagnosis not present

## 2020-01-10 DIAGNOSIS — Z0000 Encounter for general adult medical examination without abnormal findings: Secondary | ICD-10-CM | POA: Diagnosis not present

## 2020-01-10 DIAGNOSIS — R7401 Elevation of levels of liver transaminase levels: Secondary | ICD-10-CM | POA: Diagnosis not present

## 2020-01-10 DIAGNOSIS — Z7189 Other specified counseling: Secondary | ICD-10-CM | POA: Diagnosis not present

## 2020-01-10 DIAGNOSIS — G8929 Other chronic pain: Secondary | ICD-10-CM | POA: Diagnosis not present

## 2020-01-10 DIAGNOSIS — K819 Cholecystitis, unspecified: Secondary | ICD-10-CM | POA: Diagnosis not present

## 2020-01-10 DIAGNOSIS — I10 Essential (primary) hypertension: Secondary | ICD-10-CM | POA: Diagnosis not present

## 2020-01-11 DIAGNOSIS — M5489 Other dorsalgia: Secondary | ICD-10-CM | POA: Diagnosis not present

## 2020-01-11 DIAGNOSIS — I10 Essential (primary) hypertension: Secondary | ICD-10-CM | POA: Diagnosis not present

## 2020-01-11 DIAGNOSIS — Z743 Need for continuous supervision: Secondary | ICD-10-CM | POA: Diagnosis not present

## 2020-01-15 DIAGNOSIS — G4733 Obstructive sleep apnea (adult) (pediatric): Secondary | ICD-10-CM | POA: Diagnosis not present

## 2020-01-15 DIAGNOSIS — R7401 Elevation of levels of liver transaminase levels: Secondary | ICD-10-CM | POA: Diagnosis not present

## 2020-01-15 DIAGNOSIS — G8220 Paraplegia, unspecified: Secondary | ICD-10-CM | POA: Diagnosis not present

## 2020-01-15 DIAGNOSIS — Z8673 Personal history of transient ischemic attack (TIA), and cerebral infarction without residual deficits: Secondary | ICD-10-CM | POA: Diagnosis not present

## 2020-01-15 DIAGNOSIS — M6281 Muscle weakness (generalized): Secondary | ICD-10-CM | POA: Diagnosis not present

## 2020-01-15 DIAGNOSIS — I119 Hypertensive heart disease without heart failure: Secondary | ICD-10-CM | POA: Diagnosis not present

## 2020-01-15 DIAGNOSIS — R7303 Prediabetes: Secondary | ICD-10-CM | POA: Diagnosis not present

## 2020-01-15 DIAGNOSIS — G8929 Other chronic pain: Secondary | ICD-10-CM | POA: Diagnosis not present

## 2020-01-15 DIAGNOSIS — I10 Essential (primary) hypertension: Secondary | ICD-10-CM | POA: Diagnosis not present

## 2020-01-15 DIAGNOSIS — K819 Cholecystitis, unspecified: Secondary | ICD-10-CM | POA: Diagnosis not present

## 2020-01-15 DIAGNOSIS — L02414 Cutaneous abscess of left upper limb: Secondary | ICD-10-CM | POA: Diagnosis not present

## 2020-01-16 DIAGNOSIS — L02414 Cutaneous abscess of left upper limb: Secondary | ICD-10-CM | POA: Diagnosis not present

## 2020-01-16 DIAGNOSIS — R7401 Elevation of levels of liver transaminase levels: Secondary | ICD-10-CM | POA: Diagnosis not present

## 2020-01-16 DIAGNOSIS — G8929 Other chronic pain: Secondary | ICD-10-CM | POA: Diagnosis not present

## 2020-01-16 DIAGNOSIS — G8220 Paraplegia, unspecified: Secondary | ICD-10-CM | POA: Diagnosis not present

## 2020-01-16 DIAGNOSIS — K819 Cholecystitis, unspecified: Secondary | ICD-10-CM | POA: Diagnosis not present

## 2020-01-16 DIAGNOSIS — M6281 Muscle weakness (generalized): Secondary | ICD-10-CM | POA: Diagnosis not present

## 2020-01-16 DIAGNOSIS — I10 Essential (primary) hypertension: Secondary | ICD-10-CM | POA: Diagnosis not present

## 2020-01-17 DIAGNOSIS — I10 Essential (primary) hypertension: Secondary | ICD-10-CM | POA: Diagnosis not present

## 2020-01-17 DIAGNOSIS — G8220 Paraplegia, unspecified: Secondary | ICD-10-CM | POA: Diagnosis not present

## 2020-01-17 DIAGNOSIS — K819 Cholecystitis, unspecified: Secondary | ICD-10-CM | POA: Diagnosis not present

## 2020-01-17 DIAGNOSIS — M6281 Muscle weakness (generalized): Secondary | ICD-10-CM | POA: Diagnosis not present

## 2020-01-17 DIAGNOSIS — R7401 Elevation of levels of liver transaminase levels: Secondary | ICD-10-CM | POA: Diagnosis not present

## 2020-01-17 DIAGNOSIS — G8929 Other chronic pain: Secondary | ICD-10-CM | POA: Diagnosis not present

## 2020-01-17 DIAGNOSIS — L02414 Cutaneous abscess of left upper limb: Secondary | ICD-10-CM | POA: Diagnosis not present

## 2020-01-21 DIAGNOSIS — R7401 Elevation of levels of liver transaminase levels: Secondary | ICD-10-CM | POA: Diagnosis not present

## 2020-01-21 DIAGNOSIS — K819 Cholecystitis, unspecified: Secondary | ICD-10-CM | POA: Diagnosis not present

## 2020-01-21 DIAGNOSIS — G8220 Paraplegia, unspecified: Secondary | ICD-10-CM | POA: Diagnosis not present

## 2020-01-21 DIAGNOSIS — I10 Essential (primary) hypertension: Secondary | ICD-10-CM | POA: Diagnosis not present

## 2020-01-21 DIAGNOSIS — M6281 Muscle weakness (generalized): Secondary | ICD-10-CM | POA: Diagnosis not present

## 2020-01-21 DIAGNOSIS — G8929 Other chronic pain: Secondary | ICD-10-CM | POA: Diagnosis not present

## 2020-01-21 DIAGNOSIS — L02414 Cutaneous abscess of left upper limb: Secondary | ICD-10-CM | POA: Diagnosis not present

## 2020-01-22 DIAGNOSIS — I119 Hypertensive heart disease without heart failure: Secondary | ICD-10-CM | POA: Diagnosis not present

## 2020-01-22 DIAGNOSIS — G894 Chronic pain syndrome: Secondary | ICD-10-CM | POA: Diagnosis not present

## 2020-01-22 DIAGNOSIS — I10 Essential (primary) hypertension: Secondary | ICD-10-CM | POA: Diagnosis not present

## 2020-01-22 DIAGNOSIS — R7303 Prediabetes: Secondary | ICD-10-CM | POA: Diagnosis not present

## 2020-01-22 DIAGNOSIS — Z8673 Personal history of transient ischemic attack (TIA), and cerebral infarction without residual deficits: Secondary | ICD-10-CM | POA: Diagnosis not present

## 2020-01-22 DIAGNOSIS — G8220 Paraplegia, unspecified: Secondary | ICD-10-CM | POA: Diagnosis not present

## 2020-01-22 DIAGNOSIS — G4733 Obstructive sleep apnea (adult) (pediatric): Secondary | ICD-10-CM | POA: Diagnosis not present

## 2020-01-23 DIAGNOSIS — G8929 Other chronic pain: Secondary | ICD-10-CM | POA: Diagnosis not present

## 2020-01-23 DIAGNOSIS — G8220 Paraplegia, unspecified: Secondary | ICD-10-CM | POA: Diagnosis not present

## 2020-01-23 DIAGNOSIS — L02414 Cutaneous abscess of left upper limb: Secondary | ICD-10-CM | POA: Diagnosis not present

## 2020-01-23 DIAGNOSIS — R7401 Elevation of levels of liver transaminase levels: Secondary | ICD-10-CM | POA: Diagnosis not present

## 2020-01-23 DIAGNOSIS — I10 Essential (primary) hypertension: Secondary | ICD-10-CM | POA: Diagnosis not present

## 2020-01-23 DIAGNOSIS — K819 Cholecystitis, unspecified: Secondary | ICD-10-CM | POA: Diagnosis not present

## 2020-01-23 DIAGNOSIS — M6281 Muscle weakness (generalized): Secondary | ICD-10-CM | POA: Diagnosis not present

## 2020-01-25 DIAGNOSIS — E1165 Type 2 diabetes mellitus with hyperglycemia: Secondary | ICD-10-CM | POA: Diagnosis not present

## 2020-01-25 DIAGNOSIS — Z79899 Other long term (current) drug therapy: Secondary | ICD-10-CM | POA: Diagnosis not present

## 2020-01-25 DIAGNOSIS — Z7401 Bed confinement status: Secondary | ICD-10-CM | POA: Diagnosis not present

## 2020-01-25 DIAGNOSIS — E7800 Pure hypercholesterolemia, unspecified: Secondary | ICD-10-CM | POA: Diagnosis not present

## 2020-01-25 DIAGNOSIS — M2550 Pain in unspecified joint: Secondary | ICD-10-CM | POA: Diagnosis not present

## 2020-01-25 DIAGNOSIS — M5030 Other cervical disc degeneration, unspecified cervical region: Secondary | ICD-10-CM | POA: Diagnosis not present

## 2020-01-25 DIAGNOSIS — R1084 Generalized abdominal pain: Secondary | ICD-10-CM | POA: Diagnosis not present

## 2020-01-25 DIAGNOSIS — Z20822 Contact with and (suspected) exposure to covid-19: Secondary | ICD-10-CM | POA: Diagnosis not present

## 2020-01-26 DIAGNOSIS — R7401 Elevation of levels of liver transaminase levels: Secondary | ICD-10-CM | POA: Diagnosis not present

## 2020-01-26 DIAGNOSIS — I10 Essential (primary) hypertension: Secondary | ICD-10-CM | POA: Diagnosis not present

## 2020-01-26 DIAGNOSIS — M6281 Muscle weakness (generalized): Secondary | ICD-10-CM | POA: Diagnosis not present

## 2020-01-26 DIAGNOSIS — G8220 Paraplegia, unspecified: Secondary | ICD-10-CM | POA: Diagnosis not present

## 2020-01-26 DIAGNOSIS — G8929 Other chronic pain: Secondary | ICD-10-CM | POA: Diagnosis not present

## 2020-01-26 DIAGNOSIS — K819 Cholecystitis, unspecified: Secondary | ICD-10-CM | POA: Diagnosis not present

## 2020-01-26 DIAGNOSIS — L02414 Cutaneous abscess of left upper limb: Secondary | ICD-10-CM | POA: Diagnosis not present

## 2020-01-30 DIAGNOSIS — G8929 Other chronic pain: Secondary | ICD-10-CM | POA: Diagnosis not present

## 2020-01-30 DIAGNOSIS — L02414 Cutaneous abscess of left upper limb: Secondary | ICD-10-CM | POA: Diagnosis not present

## 2020-01-30 DIAGNOSIS — M6281 Muscle weakness (generalized): Secondary | ICD-10-CM | POA: Diagnosis not present

## 2020-01-30 DIAGNOSIS — I10 Essential (primary) hypertension: Secondary | ICD-10-CM | POA: Diagnosis not present

## 2020-01-30 DIAGNOSIS — R7401 Elevation of levels of liver transaminase levels: Secondary | ICD-10-CM | POA: Diagnosis not present

## 2020-01-30 DIAGNOSIS — G8220 Paraplegia, unspecified: Secondary | ICD-10-CM | POA: Diagnosis not present

## 2020-01-30 DIAGNOSIS — K819 Cholecystitis, unspecified: Secondary | ICD-10-CM | POA: Diagnosis not present

## 2020-02-05 DIAGNOSIS — I10 Essential (primary) hypertension: Secondary | ICD-10-CM | POA: Diagnosis not present

## 2020-02-05 DIAGNOSIS — G8220 Paraplegia, unspecified: Secondary | ICD-10-CM | POA: Diagnosis not present

## 2020-02-05 DIAGNOSIS — K819 Cholecystitis, unspecified: Secondary | ICD-10-CM | POA: Diagnosis not present

## 2020-02-05 DIAGNOSIS — R7401 Elevation of levels of liver transaminase levels: Secondary | ICD-10-CM | POA: Diagnosis not present

## 2020-02-05 DIAGNOSIS — M6281 Muscle weakness (generalized): Secondary | ICD-10-CM | POA: Diagnosis not present

## 2020-02-05 DIAGNOSIS — L02414 Cutaneous abscess of left upper limb: Secondary | ICD-10-CM | POA: Diagnosis not present

## 2020-02-05 DIAGNOSIS — G8929 Other chronic pain: Secondary | ICD-10-CM | POA: Diagnosis not present

## 2020-02-06 DIAGNOSIS — G8929 Other chronic pain: Secondary | ICD-10-CM | POA: Diagnosis not present

## 2020-02-06 DIAGNOSIS — L02414 Cutaneous abscess of left upper limb: Secondary | ICD-10-CM | POA: Diagnosis not present

## 2020-02-06 DIAGNOSIS — R7401 Elevation of levels of liver transaminase levels: Secondary | ICD-10-CM | POA: Diagnosis not present

## 2020-02-06 DIAGNOSIS — M6281 Muscle weakness (generalized): Secondary | ICD-10-CM | POA: Diagnosis not present

## 2020-02-06 DIAGNOSIS — G8220 Paraplegia, unspecified: Secondary | ICD-10-CM | POA: Diagnosis not present

## 2020-02-06 DIAGNOSIS — K819 Cholecystitis, unspecified: Secondary | ICD-10-CM | POA: Diagnosis not present

## 2020-02-06 DIAGNOSIS — I10 Essential (primary) hypertension: Secondary | ICD-10-CM | POA: Diagnosis not present

## 2020-02-08 DIAGNOSIS — G8220 Paraplegia, unspecified: Secondary | ICD-10-CM | POA: Diagnosis not present

## 2020-02-08 DIAGNOSIS — K819 Cholecystitis, unspecified: Secondary | ICD-10-CM | POA: Diagnosis not present

## 2020-02-08 DIAGNOSIS — L02414 Cutaneous abscess of left upper limb: Secondary | ICD-10-CM | POA: Diagnosis not present

## 2020-02-08 DIAGNOSIS — R7401 Elevation of levels of liver transaminase levels: Secondary | ICD-10-CM | POA: Diagnosis not present

## 2020-02-08 DIAGNOSIS — M6281 Muscle weakness (generalized): Secondary | ICD-10-CM | POA: Diagnosis not present

## 2020-02-08 DIAGNOSIS — G8929 Other chronic pain: Secondary | ICD-10-CM | POA: Diagnosis not present

## 2020-02-08 DIAGNOSIS — I10 Essential (primary) hypertension: Secondary | ICD-10-CM | POA: Diagnosis not present

## 2020-02-12 DIAGNOSIS — G8220 Paraplegia, unspecified: Secondary | ICD-10-CM | POA: Diagnosis not present

## 2020-02-12 DIAGNOSIS — G8929 Other chronic pain: Secondary | ICD-10-CM | POA: Diagnosis not present

## 2020-02-12 DIAGNOSIS — L02414 Cutaneous abscess of left upper limb: Secondary | ICD-10-CM | POA: Diagnosis not present

## 2020-02-12 DIAGNOSIS — K659 Peritonitis, unspecified: Secondary | ICD-10-CM | POA: Diagnosis not present

## 2020-02-12 DIAGNOSIS — I10 Essential (primary) hypertension: Secondary | ICD-10-CM | POA: Diagnosis not present

## 2020-02-12 DIAGNOSIS — K819 Cholecystitis, unspecified: Secondary | ICD-10-CM | POA: Diagnosis not present

## 2020-02-12 DIAGNOSIS — M792 Neuralgia and neuritis, unspecified: Secondary | ICD-10-CM | POA: Diagnosis not present

## 2020-02-12 DIAGNOSIS — E1165 Type 2 diabetes mellitus with hyperglycemia: Secondary | ICD-10-CM | POA: Diagnosis not present

## 2020-02-12 DIAGNOSIS — Z7401 Bed confinement status: Secondary | ICD-10-CM | POA: Diagnosis not present

## 2020-02-12 DIAGNOSIS — R7401 Elevation of levels of liver transaminase levels: Secondary | ICD-10-CM | POA: Diagnosis not present

## 2020-02-12 DIAGNOSIS — M2550 Pain in unspecified joint: Secondary | ICD-10-CM | POA: Diagnosis not present

## 2020-02-12 DIAGNOSIS — Z20822 Contact with and (suspected) exposure to covid-19: Secondary | ICD-10-CM | POA: Diagnosis not present

## 2020-02-12 DIAGNOSIS — Z79899 Other long term (current) drug therapy: Secondary | ICD-10-CM | POA: Diagnosis not present

## 2020-02-12 DIAGNOSIS — M6281 Muscle weakness (generalized): Secondary | ICD-10-CM | POA: Diagnosis not present

## 2020-02-13 DIAGNOSIS — G8929 Other chronic pain: Secondary | ICD-10-CM | POA: Diagnosis not present

## 2020-02-13 DIAGNOSIS — M6281 Muscle weakness (generalized): Secondary | ICD-10-CM | POA: Diagnosis not present

## 2020-02-13 DIAGNOSIS — I10 Essential (primary) hypertension: Secondary | ICD-10-CM | POA: Diagnosis not present

## 2020-02-13 DIAGNOSIS — R7401 Elevation of levels of liver transaminase levels: Secondary | ICD-10-CM | POA: Diagnosis not present

## 2020-02-13 DIAGNOSIS — Z8673 Personal history of transient ischemic attack (TIA), and cerebral infarction without residual deficits: Secondary | ICD-10-CM | POA: Diagnosis not present

## 2020-02-13 DIAGNOSIS — G8220 Paraplegia, unspecified: Secondary | ICD-10-CM | POA: Diagnosis not present

## 2020-02-13 DIAGNOSIS — L02414 Cutaneous abscess of left upper limb: Secondary | ICD-10-CM | POA: Diagnosis not present

## 2020-02-13 DIAGNOSIS — K819 Cholecystitis, unspecified: Secondary | ICD-10-CM | POA: Diagnosis not present

## 2020-02-14 DIAGNOSIS — I10 Essential (primary) hypertension: Secondary | ICD-10-CM | POA: Diagnosis not present

## 2020-02-14 DIAGNOSIS — G8220 Paraplegia, unspecified: Secondary | ICD-10-CM | POA: Diagnosis not present

## 2020-02-14 DIAGNOSIS — L02414 Cutaneous abscess of left upper limb: Secondary | ICD-10-CM | POA: Diagnosis not present

## 2020-02-14 DIAGNOSIS — M6281 Muscle weakness (generalized): Secondary | ICD-10-CM | POA: Diagnosis not present

## 2020-02-14 DIAGNOSIS — Z79891 Long term (current) use of opiate analgesic: Secondary | ICD-10-CM | POA: Diagnosis not present

## 2020-02-14 DIAGNOSIS — K819 Cholecystitis, unspecified: Secondary | ICD-10-CM | POA: Diagnosis not present

## 2020-02-14 DIAGNOSIS — M5431 Sciatica, right side: Secondary | ICD-10-CM | POA: Diagnosis not present

## 2020-02-14 DIAGNOSIS — G8222 Paraplegia, incomplete: Secondary | ICD-10-CM | POA: Diagnosis not present

## 2020-02-14 DIAGNOSIS — G8929 Other chronic pain: Secondary | ICD-10-CM | POA: Diagnosis not present

## 2020-02-14 DIAGNOSIS — G894 Chronic pain syndrome: Secondary | ICD-10-CM | POA: Diagnosis not present

## 2020-02-14 DIAGNOSIS — M5432 Sciatica, left side: Secondary | ICD-10-CM | POA: Diagnosis not present

## 2020-02-14 DIAGNOSIS — G9511 Acute infarction of spinal cord (embolic) (nonembolic): Secondary | ICD-10-CM | POA: Diagnosis not present

## 2020-02-14 DIAGNOSIS — R7401 Elevation of levels of liver transaminase levels: Secondary | ICD-10-CM | POA: Diagnosis not present

## 2020-02-19 DIAGNOSIS — I10 Essential (primary) hypertension: Secondary | ICD-10-CM | POA: Diagnosis not present

## 2020-02-19 DIAGNOSIS — R7401 Elevation of levels of liver transaminase levels: Secondary | ICD-10-CM | POA: Diagnosis not present

## 2020-02-19 DIAGNOSIS — L02414 Cutaneous abscess of left upper limb: Secondary | ICD-10-CM | POA: Diagnosis not present

## 2020-02-19 DIAGNOSIS — G8220 Paraplegia, unspecified: Secondary | ICD-10-CM | POA: Diagnosis not present

## 2020-02-19 DIAGNOSIS — M6281 Muscle weakness (generalized): Secondary | ICD-10-CM | POA: Diagnosis not present

## 2020-02-19 DIAGNOSIS — G8929 Other chronic pain: Secondary | ICD-10-CM | POA: Diagnosis not present

## 2020-02-19 DIAGNOSIS — K819 Cholecystitis, unspecified: Secondary | ICD-10-CM | POA: Diagnosis not present

## 2020-02-21 DIAGNOSIS — K819 Cholecystitis, unspecified: Secondary | ICD-10-CM | POA: Diagnosis not present

## 2020-02-21 DIAGNOSIS — G8929 Other chronic pain: Secondary | ICD-10-CM | POA: Diagnosis not present

## 2020-02-21 DIAGNOSIS — G8220 Paraplegia, unspecified: Secondary | ICD-10-CM | POA: Diagnosis not present

## 2020-02-21 DIAGNOSIS — I10 Essential (primary) hypertension: Secondary | ICD-10-CM | POA: Diagnosis not present

## 2020-02-21 DIAGNOSIS — R7401 Elevation of levels of liver transaminase levels: Secondary | ICD-10-CM | POA: Diagnosis not present

## 2020-02-21 DIAGNOSIS — L02414 Cutaneous abscess of left upper limb: Secondary | ICD-10-CM | POA: Diagnosis not present

## 2020-02-21 DIAGNOSIS — M6281 Muscle weakness (generalized): Secondary | ICD-10-CM | POA: Diagnosis not present

## 2020-02-22 DIAGNOSIS — Z8673 Personal history of transient ischemic attack (TIA), and cerebral infarction without residual deficits: Secondary | ICD-10-CM | POA: Diagnosis not present

## 2020-02-25 DIAGNOSIS — Z8673 Personal history of transient ischemic attack (TIA), and cerebral infarction without residual deficits: Secondary | ICD-10-CM | POA: Diagnosis not present

## 2020-02-26 DIAGNOSIS — G8929 Other chronic pain: Secondary | ICD-10-CM | POA: Diagnosis not present

## 2020-02-26 DIAGNOSIS — R7401 Elevation of levels of liver transaminase levels: Secondary | ICD-10-CM | POA: Diagnosis not present

## 2020-02-26 DIAGNOSIS — K819 Cholecystitis, unspecified: Secondary | ICD-10-CM | POA: Diagnosis not present

## 2020-02-26 DIAGNOSIS — I10 Essential (primary) hypertension: Secondary | ICD-10-CM | POA: Diagnosis not present

## 2020-02-26 DIAGNOSIS — L02414 Cutaneous abscess of left upper limb: Secondary | ICD-10-CM | POA: Diagnosis not present

## 2020-02-26 DIAGNOSIS — G8220 Paraplegia, unspecified: Secondary | ICD-10-CM | POA: Diagnosis not present

## 2020-02-26 DIAGNOSIS — M6281 Muscle weakness (generalized): Secondary | ICD-10-CM | POA: Diagnosis not present

## 2020-02-28 DIAGNOSIS — R7401 Elevation of levels of liver transaminase levels: Secondary | ICD-10-CM | POA: Diagnosis not present

## 2020-02-28 DIAGNOSIS — K819 Cholecystitis, unspecified: Secondary | ICD-10-CM | POA: Diagnosis not present

## 2020-02-28 DIAGNOSIS — M6281 Muscle weakness (generalized): Secondary | ICD-10-CM | POA: Diagnosis not present

## 2020-02-28 DIAGNOSIS — I10 Essential (primary) hypertension: Secondary | ICD-10-CM | POA: Diagnosis not present

## 2020-02-28 DIAGNOSIS — G8929 Other chronic pain: Secondary | ICD-10-CM | POA: Diagnosis not present

## 2020-02-28 DIAGNOSIS — G8220 Paraplegia, unspecified: Secondary | ICD-10-CM | POA: Diagnosis not present

## 2020-02-28 DIAGNOSIS — L02414 Cutaneous abscess of left upper limb: Secondary | ICD-10-CM | POA: Diagnosis not present

## 2020-02-29 DIAGNOSIS — M2041 Other hammer toe(s) (acquired), right foot: Secondary | ICD-10-CM | POA: Diagnosis not present

## 2020-02-29 DIAGNOSIS — E0840 Diabetes mellitus due to underlying condition with diabetic neuropathy, unspecified: Secondary | ICD-10-CM | POA: Diagnosis not present

## 2020-02-29 DIAGNOSIS — B351 Tinea unguium: Secondary | ICD-10-CM | POA: Diagnosis not present

## 2020-03-04 DIAGNOSIS — G8929 Other chronic pain: Secondary | ICD-10-CM | POA: Diagnosis not present

## 2020-03-04 DIAGNOSIS — M6281 Muscle weakness (generalized): Secondary | ICD-10-CM | POA: Diagnosis not present

## 2020-03-04 DIAGNOSIS — L02414 Cutaneous abscess of left upper limb: Secondary | ICD-10-CM | POA: Diagnosis not present

## 2020-03-04 DIAGNOSIS — I10 Essential (primary) hypertension: Secondary | ICD-10-CM | POA: Diagnosis not present

## 2020-03-04 DIAGNOSIS — G8220 Paraplegia, unspecified: Secondary | ICD-10-CM | POA: Diagnosis not present

## 2020-03-04 DIAGNOSIS — R7401 Elevation of levels of liver transaminase levels: Secondary | ICD-10-CM | POA: Diagnosis not present

## 2020-03-04 DIAGNOSIS — K819 Cholecystitis, unspecified: Secondary | ICD-10-CM | POA: Diagnosis not present

## 2020-03-07 DIAGNOSIS — L02414 Cutaneous abscess of left upper limb: Secondary | ICD-10-CM | POA: Diagnosis not present

## 2020-03-07 DIAGNOSIS — G8929 Other chronic pain: Secondary | ICD-10-CM | POA: Diagnosis not present

## 2020-03-07 DIAGNOSIS — I10 Essential (primary) hypertension: Secondary | ICD-10-CM | POA: Diagnosis not present

## 2020-03-07 DIAGNOSIS — M6281 Muscle weakness (generalized): Secondary | ICD-10-CM | POA: Diagnosis not present

## 2020-03-07 DIAGNOSIS — K819 Cholecystitis, unspecified: Secondary | ICD-10-CM | POA: Diagnosis not present

## 2020-03-07 DIAGNOSIS — R7401 Elevation of levels of liver transaminase levels: Secondary | ICD-10-CM | POA: Diagnosis not present

## 2020-03-07 DIAGNOSIS — G8220 Paraplegia, unspecified: Secondary | ICD-10-CM | POA: Diagnosis not present

## 2020-03-10 DIAGNOSIS — G9511 Acute infarction of spinal cord (embolic) (nonembolic): Secondary | ICD-10-CM | POA: Diagnosis not present

## 2020-03-10 DIAGNOSIS — I69351 Hemiplegia and hemiparesis following cerebral infarction affecting right dominant side: Secondary | ICD-10-CM | POA: Diagnosis not present

## 2020-03-10 DIAGNOSIS — L98491 Non-pressure chronic ulcer of skin of other sites limited to breakdown of skin: Secondary | ICD-10-CM | POA: Diagnosis not present

## 2020-03-10 DIAGNOSIS — R609 Edema, unspecified: Secondary | ICD-10-CM | POA: Diagnosis not present

## 2020-03-11 DIAGNOSIS — I10 Essential (primary) hypertension: Secondary | ICD-10-CM | POA: Diagnosis not present

## 2020-03-11 DIAGNOSIS — R7401 Elevation of levels of liver transaminase levels: Secondary | ICD-10-CM | POA: Diagnosis not present

## 2020-03-11 DIAGNOSIS — M6281 Muscle weakness (generalized): Secondary | ICD-10-CM | POA: Diagnosis not present

## 2020-03-11 DIAGNOSIS — G8220 Paraplegia, unspecified: Secondary | ICD-10-CM | POA: Diagnosis not present

## 2020-03-11 DIAGNOSIS — G8929 Other chronic pain: Secondary | ICD-10-CM | POA: Diagnosis not present

## 2020-03-11 DIAGNOSIS — K819 Cholecystitis, unspecified: Secondary | ICD-10-CM | POA: Diagnosis not present

## 2020-03-13 DIAGNOSIS — M5432 Sciatica, left side: Secondary | ICD-10-CM | POA: Diagnosis not present

## 2020-03-13 DIAGNOSIS — I10 Essential (primary) hypertension: Secondary | ICD-10-CM | POA: Diagnosis not present

## 2020-03-13 DIAGNOSIS — K819 Cholecystitis, unspecified: Secondary | ICD-10-CM | POA: Diagnosis not present

## 2020-03-13 DIAGNOSIS — R7401 Elevation of levels of liver transaminase levels: Secondary | ICD-10-CM | POA: Diagnosis not present

## 2020-03-13 DIAGNOSIS — G894 Chronic pain syndrome: Secondary | ICD-10-CM | POA: Diagnosis not present

## 2020-03-13 DIAGNOSIS — M6281 Muscle weakness (generalized): Secondary | ICD-10-CM | POA: Diagnosis not present

## 2020-03-13 DIAGNOSIS — I69359 Hemiplegia and hemiparesis following cerebral infarction affecting unspecified side: Secondary | ICD-10-CM | POA: Diagnosis not present

## 2020-03-13 DIAGNOSIS — Z79891 Long term (current) use of opiate analgesic: Secondary | ICD-10-CM | POA: Diagnosis not present

## 2020-03-13 DIAGNOSIS — G8222 Paraplegia, incomplete: Secondary | ICD-10-CM | POA: Diagnosis not present

## 2020-03-13 DIAGNOSIS — G8220 Paraplegia, unspecified: Secondary | ICD-10-CM | POA: Diagnosis not present

## 2020-03-13 DIAGNOSIS — M5431 Sciatica, right side: Secondary | ICD-10-CM | POA: Diagnosis not present

## 2020-03-13 DIAGNOSIS — G8929 Other chronic pain: Secondary | ICD-10-CM | POA: Diagnosis not present

## 2020-03-18 DIAGNOSIS — K819 Cholecystitis, unspecified: Secondary | ICD-10-CM | POA: Diagnosis not present

## 2020-03-18 DIAGNOSIS — R7401 Elevation of levels of liver transaminase levels: Secondary | ICD-10-CM | POA: Diagnosis not present

## 2020-03-18 DIAGNOSIS — M6281 Muscle weakness (generalized): Secondary | ICD-10-CM | POA: Diagnosis not present

## 2020-03-18 DIAGNOSIS — G8220 Paraplegia, unspecified: Secondary | ICD-10-CM | POA: Diagnosis not present

## 2020-03-18 DIAGNOSIS — I10 Essential (primary) hypertension: Secondary | ICD-10-CM | POA: Diagnosis not present

## 2020-03-18 DIAGNOSIS — G8929 Other chronic pain: Secondary | ICD-10-CM | POA: Diagnosis not present

## 2020-03-20 DIAGNOSIS — M6281 Muscle weakness (generalized): Secondary | ICD-10-CM | POA: Diagnosis not present

## 2020-03-20 DIAGNOSIS — R7401 Elevation of levels of liver transaminase levels: Secondary | ICD-10-CM | POA: Diagnosis not present

## 2020-03-20 DIAGNOSIS — G8220 Paraplegia, unspecified: Secondary | ICD-10-CM | POA: Diagnosis not present

## 2020-03-20 DIAGNOSIS — K819 Cholecystitis, unspecified: Secondary | ICD-10-CM | POA: Diagnosis not present

## 2020-03-20 DIAGNOSIS — G8929 Other chronic pain: Secondary | ICD-10-CM | POA: Diagnosis not present

## 2020-03-20 DIAGNOSIS — I10 Essential (primary) hypertension: Secondary | ICD-10-CM | POA: Diagnosis not present

## 2020-03-24 DIAGNOSIS — Z8673 Personal history of transient ischemic attack (TIA), and cerebral infarction without residual deficits: Secondary | ICD-10-CM | POA: Diagnosis not present

## 2020-03-25 DIAGNOSIS — I10 Essential (primary) hypertension: Secondary | ICD-10-CM | POA: Diagnosis not present

## 2020-03-25 DIAGNOSIS — G8929 Other chronic pain: Secondary | ICD-10-CM | POA: Diagnosis not present

## 2020-03-25 DIAGNOSIS — R7401 Elevation of levels of liver transaminase levels: Secondary | ICD-10-CM | POA: Diagnosis not present

## 2020-03-25 DIAGNOSIS — K819 Cholecystitis, unspecified: Secondary | ICD-10-CM | POA: Diagnosis not present

## 2020-03-25 DIAGNOSIS — G8220 Paraplegia, unspecified: Secondary | ICD-10-CM | POA: Diagnosis not present

## 2020-03-25 DIAGNOSIS — M6281 Muscle weakness (generalized): Secondary | ICD-10-CM | POA: Diagnosis not present

## 2020-03-27 DIAGNOSIS — G8929 Other chronic pain: Secondary | ICD-10-CM | POA: Diagnosis not present

## 2020-03-27 DIAGNOSIS — R7401 Elevation of levels of liver transaminase levels: Secondary | ICD-10-CM | POA: Diagnosis not present

## 2020-03-27 DIAGNOSIS — I10 Essential (primary) hypertension: Secondary | ICD-10-CM | POA: Diagnosis not present

## 2020-03-27 DIAGNOSIS — K819 Cholecystitis, unspecified: Secondary | ICD-10-CM | POA: Diagnosis not present

## 2020-03-27 DIAGNOSIS — G8220 Paraplegia, unspecified: Secondary | ICD-10-CM | POA: Diagnosis not present

## 2020-03-27 DIAGNOSIS — M6281 Muscle weakness (generalized): Secondary | ICD-10-CM | POA: Diagnosis not present

## 2020-03-28 DIAGNOSIS — I10 Essential (primary) hypertension: Secondary | ICD-10-CM | POA: Diagnosis not present

## 2020-03-28 DIAGNOSIS — E730 Congenital lactase deficiency: Secondary | ICD-10-CM | POA: Diagnosis not present

## 2020-04-03 DIAGNOSIS — I10 Essential (primary) hypertension: Secondary | ICD-10-CM | POA: Diagnosis not present

## 2020-04-03 DIAGNOSIS — K819 Cholecystitis, unspecified: Secondary | ICD-10-CM | POA: Diagnosis not present

## 2020-04-03 DIAGNOSIS — G8220 Paraplegia, unspecified: Secondary | ICD-10-CM | POA: Diagnosis not present

## 2020-04-03 DIAGNOSIS — R7401 Elevation of levels of liver transaminase levels: Secondary | ICD-10-CM | POA: Diagnosis not present

## 2020-04-03 DIAGNOSIS — G8929 Other chronic pain: Secondary | ICD-10-CM | POA: Diagnosis not present

## 2020-04-03 DIAGNOSIS — M6281 Muscle weakness (generalized): Secondary | ICD-10-CM | POA: Diagnosis not present

## 2020-04-05 DIAGNOSIS — Z8673 Personal history of transient ischemic attack (TIA), and cerebral infarction without residual deficits: Secondary | ICD-10-CM | POA: Diagnosis not present

## 2020-04-10 DIAGNOSIS — M6281 Muscle weakness (generalized): Secondary | ICD-10-CM | POA: Diagnosis not present

## 2020-04-10 DIAGNOSIS — K819 Cholecystitis, unspecified: Secondary | ICD-10-CM | POA: Diagnosis not present

## 2020-04-10 DIAGNOSIS — G8220 Paraplegia, unspecified: Secondary | ICD-10-CM | POA: Diagnosis not present

## 2020-04-10 DIAGNOSIS — G8929 Other chronic pain: Secondary | ICD-10-CM | POA: Diagnosis not present

## 2020-04-10 DIAGNOSIS — I69359 Hemiplegia and hemiparesis following cerebral infarction affecting unspecified side: Secondary | ICD-10-CM | POA: Diagnosis not present

## 2020-04-10 DIAGNOSIS — M5432 Sciatica, left side: Secondary | ICD-10-CM | POA: Diagnosis not present

## 2020-04-10 DIAGNOSIS — M5431 Sciatica, right side: Secondary | ICD-10-CM | POA: Diagnosis not present

## 2020-04-10 DIAGNOSIS — R7401 Elevation of levels of liver transaminase levels: Secondary | ICD-10-CM | POA: Diagnosis not present

## 2020-04-10 DIAGNOSIS — G8222 Paraplegia, incomplete: Secondary | ICD-10-CM | POA: Diagnosis not present

## 2020-04-10 DIAGNOSIS — I10 Essential (primary) hypertension: Secondary | ICD-10-CM | POA: Diagnosis not present

## 2020-04-23 DIAGNOSIS — Z8673 Personal history of transient ischemic attack (TIA), and cerebral infarction without residual deficits: Secondary | ICD-10-CM | POA: Diagnosis not present

## 2020-05-21 DIAGNOSIS — G894 Chronic pain syndrome: Secondary | ICD-10-CM | POA: Diagnosis not present

## 2020-05-21 DIAGNOSIS — M7989 Other specified soft tissue disorders: Secondary | ICD-10-CM | POA: Diagnosis not present

## 2020-05-21 DIAGNOSIS — Z993 Dependence on wheelchair: Secondary | ICD-10-CM | POA: Diagnosis not present

## 2020-05-21 DIAGNOSIS — I10 Essential (primary) hypertension: Secondary | ICD-10-CM | POA: Diagnosis not present

## 2020-05-24 DIAGNOSIS — Z8673 Personal history of transient ischemic attack (TIA), and cerebral infarction without residual deficits: Secondary | ICD-10-CM | POA: Diagnosis not present

## 2020-05-27 DIAGNOSIS — Z8673 Personal history of transient ischemic attack (TIA), and cerebral infarction without residual deficits: Secondary | ICD-10-CM | POA: Diagnosis not present

## 2020-06-20 DIAGNOSIS — Z743 Need for continuous supervision: Secondary | ICD-10-CM | POA: Diagnosis not present

## 2020-06-20 DIAGNOSIS — J3489 Other specified disorders of nose and nasal sinuses: Secondary | ICD-10-CM | POA: Diagnosis not present

## 2020-06-20 DIAGNOSIS — J0120 Acute ethmoidal sinusitis, unspecified: Secondary | ICD-10-CM | POA: Diagnosis not present

## 2020-06-20 DIAGNOSIS — R9431 Abnormal electrocardiogram [ECG] [EKG]: Secondary | ICD-10-CM | POA: Diagnosis not present

## 2020-06-20 DIAGNOSIS — R404 Transient alteration of awareness: Secondary | ICD-10-CM | POA: Diagnosis not present

## 2020-06-20 DIAGNOSIS — R52 Pain, unspecified: Secondary | ICD-10-CM | POA: Diagnosis not present

## 2020-06-20 DIAGNOSIS — R519 Headache, unspecified: Secondary | ICD-10-CM | POA: Diagnosis not present

## 2020-06-20 DIAGNOSIS — R0902 Hypoxemia: Secondary | ICD-10-CM | POA: Diagnosis not present

## 2020-06-20 DIAGNOSIS — I7090 Unspecified atherosclerosis: Secondary | ICD-10-CM | POA: Diagnosis not present

## 2020-06-20 DIAGNOSIS — M549 Dorsalgia, unspecified: Secondary | ICD-10-CM | POA: Diagnosis not present

## 2020-06-20 DIAGNOSIS — D649 Anemia, unspecified: Secondary | ICD-10-CM | POA: Diagnosis not present

## 2020-06-21 DIAGNOSIS — R9431 Abnormal electrocardiogram [ECG] [EKG]: Secondary | ICD-10-CM | POA: Diagnosis not present

## 2020-06-21 DIAGNOSIS — Z743 Need for continuous supervision: Secondary | ICD-10-CM | POA: Diagnosis not present

## 2020-06-21 DIAGNOSIS — R279 Unspecified lack of coordination: Secondary | ICD-10-CM | POA: Diagnosis not present

## 2020-06-21 DIAGNOSIS — R531 Weakness: Secondary | ICD-10-CM | POA: Diagnosis not present

## 2020-06-23 DIAGNOSIS — Z8673 Personal history of transient ischemic attack (TIA), and cerebral infarction without residual deficits: Secondary | ICD-10-CM | POA: Diagnosis not present

## 2020-06-26 DIAGNOSIS — Z8673 Personal history of transient ischemic attack (TIA), and cerebral infarction without residual deficits: Secondary | ICD-10-CM | POA: Diagnosis not present

## 2020-07-15 DIAGNOSIS — G8222 Paraplegia, incomplete: Secondary | ICD-10-CM | POA: Diagnosis not present

## 2020-07-15 DIAGNOSIS — G8929 Other chronic pain: Secondary | ICD-10-CM | POA: Diagnosis not present

## 2020-07-15 DIAGNOSIS — I69359 Hemiplegia and hemiparesis following cerebral infarction affecting unspecified side: Secondary | ICD-10-CM | POA: Diagnosis not present

## 2020-07-15 DIAGNOSIS — M5432 Sciatica, left side: Secondary | ICD-10-CM | POA: Diagnosis not present

## 2020-07-15 DIAGNOSIS — M5431 Sciatica, right side: Secondary | ICD-10-CM | POA: Diagnosis not present

## 2020-07-24 DIAGNOSIS — Z8673 Personal history of transient ischemic attack (TIA), and cerebral infarction without residual deficits: Secondary | ICD-10-CM | POA: Diagnosis not present

## 2020-07-27 DIAGNOSIS — Z8673 Personal history of transient ischemic attack (TIA), and cerebral infarction without residual deficits: Secondary | ICD-10-CM | POA: Diagnosis not present

## 2020-08-12 DIAGNOSIS — I69359 Hemiplegia and hemiparesis following cerebral infarction affecting unspecified side: Secondary | ICD-10-CM | POA: Diagnosis not present

## 2020-08-12 DIAGNOSIS — M5432 Sciatica, left side: Secondary | ICD-10-CM | POA: Diagnosis not present

## 2020-08-12 DIAGNOSIS — G8929 Other chronic pain: Secondary | ICD-10-CM | POA: Diagnosis not present

## 2020-08-12 DIAGNOSIS — G8222 Paraplegia, incomplete: Secondary | ICD-10-CM | POA: Diagnosis not present

## 2020-08-12 DIAGNOSIS — M5431 Sciatica, right side: Secondary | ICD-10-CM | POA: Diagnosis not present

## 2020-08-12 DIAGNOSIS — Z79891 Long term (current) use of opiate analgesic: Secondary | ICD-10-CM | POA: Diagnosis not present

## 2020-08-12 DIAGNOSIS — G894 Chronic pain syndrome: Secondary | ICD-10-CM | POA: Diagnosis not present

## 2020-08-24 DIAGNOSIS — Z8673 Personal history of transient ischemic attack (TIA), and cerebral infarction without residual deficits: Secondary | ICD-10-CM | POA: Diagnosis not present

## 2020-08-27 DIAGNOSIS — Z8673 Personal history of transient ischemic attack (TIA), and cerebral infarction without residual deficits: Secondary | ICD-10-CM | POA: Diagnosis not present

## 2020-09-09 DIAGNOSIS — M5137 Other intervertebral disc degeneration, lumbosacral region: Secondary | ICD-10-CM | POA: Diagnosis not present

## 2020-09-09 DIAGNOSIS — G8222 Paraplegia, incomplete: Secondary | ICD-10-CM | POA: Diagnosis not present

## 2020-09-09 DIAGNOSIS — G8929 Other chronic pain: Secondary | ICD-10-CM | POA: Diagnosis not present

## 2020-09-09 DIAGNOSIS — M5432 Sciatica, left side: Secondary | ICD-10-CM | POA: Diagnosis not present

## 2020-09-09 DIAGNOSIS — M5431 Sciatica, right side: Secondary | ICD-10-CM | POA: Diagnosis not present

## 2020-09-09 DIAGNOSIS — I69359 Hemiplegia and hemiparesis following cerebral infarction affecting unspecified side: Secondary | ICD-10-CM | POA: Diagnosis not present

## 2020-09-10 DIAGNOSIS — G894 Chronic pain syndrome: Secondary | ICD-10-CM | POA: Diagnosis not present

## 2020-09-10 DIAGNOSIS — Z79891 Long term (current) use of opiate analgesic: Secondary | ICD-10-CM | POA: Diagnosis not present

## 2020-09-10 DIAGNOSIS — G8929 Other chronic pain: Secondary | ICD-10-CM | POA: Diagnosis not present

## 2020-09-20 DIAGNOSIS — I10 Essential (primary) hypertension: Secondary | ICD-10-CM | POA: Diagnosis not present

## 2020-09-20 DIAGNOSIS — Z993 Dependence on wheelchair: Secondary | ICD-10-CM | POA: Diagnosis not present

## 2020-09-20 DIAGNOSIS — G8220 Paraplegia, unspecified: Secondary | ICD-10-CM | POA: Diagnosis not present

## 2020-09-20 DIAGNOSIS — G894 Chronic pain syndrome: Secondary | ICD-10-CM | POA: Diagnosis not present

## 2020-09-26 DIAGNOSIS — Z8673 Personal history of transient ischemic attack (TIA), and cerebral infarction without residual deficits: Secondary | ICD-10-CM | POA: Diagnosis not present

## 2020-10-06 DIAGNOSIS — Z8673 Personal history of transient ischemic attack (TIA), and cerebral infarction without residual deficits: Secondary | ICD-10-CM | POA: Diagnosis not present

## 2020-10-09 DIAGNOSIS — G8929 Other chronic pain: Secondary | ICD-10-CM | POA: Diagnosis not present

## 2020-10-09 DIAGNOSIS — G8222 Paraplegia, incomplete: Secondary | ICD-10-CM | POA: Diagnosis not present

## 2020-10-09 DIAGNOSIS — M5432 Sciatica, left side: Secondary | ICD-10-CM | POA: Diagnosis not present

## 2020-10-09 DIAGNOSIS — M5431 Sciatica, right side: Secondary | ICD-10-CM | POA: Diagnosis not present

## 2020-10-09 DIAGNOSIS — I69359 Hemiplegia and hemiparesis following cerebral infarction affecting unspecified side: Secondary | ICD-10-CM | POA: Diagnosis not present

## 2020-10-24 DIAGNOSIS — G894 Chronic pain syndrome: Secondary | ICD-10-CM | POA: Diagnosis not present

## 2020-10-24 DIAGNOSIS — M7989 Other specified soft tissue disorders: Secondary | ICD-10-CM | POA: Diagnosis not present

## 2020-10-24 DIAGNOSIS — I10 Essential (primary) hypertension: Secondary | ICD-10-CM | POA: Diagnosis not present

## 2020-10-24 DIAGNOSIS — R7303 Prediabetes: Secondary | ICD-10-CM | POA: Diagnosis not present

## 2020-10-27 DIAGNOSIS — Z8673 Personal history of transient ischemic attack (TIA), and cerebral infarction without residual deficits: Secondary | ICD-10-CM | POA: Diagnosis not present

## 2020-10-28 DIAGNOSIS — M5116 Intervertebral disc disorders with radiculopathy, lumbar region: Secondary | ICD-10-CM | POA: Diagnosis not present

## 2020-11-14 DIAGNOSIS — M545 Low back pain, unspecified: Secondary | ICD-10-CM | POA: Diagnosis not present

## 2020-11-14 DIAGNOSIS — M5116 Intervertebral disc disorders with radiculopathy, lumbar region: Secondary | ICD-10-CM | POA: Diagnosis not present

## 2020-11-14 DIAGNOSIS — G8929 Other chronic pain: Secondary | ICD-10-CM | POA: Diagnosis not present

## 2020-11-14 DIAGNOSIS — M5431 Sciatica, right side: Secondary | ICD-10-CM | POA: Diagnosis not present

## 2020-11-14 DIAGNOSIS — M5432 Sciatica, left side: Secondary | ICD-10-CM | POA: Diagnosis not present

## 2020-11-20 DIAGNOSIS — R7303 Prediabetes: Secondary | ICD-10-CM | POA: Diagnosis not present

## 2020-11-20 DIAGNOSIS — Z79891 Long term (current) use of opiate analgesic: Secondary | ICD-10-CM | POA: Diagnosis not present

## 2020-11-20 DIAGNOSIS — G8929 Other chronic pain: Secondary | ICD-10-CM | POA: Diagnosis not present

## 2020-11-20 DIAGNOSIS — I10 Essential (primary) hypertension: Secondary | ICD-10-CM | POA: Diagnosis not present

## 2020-11-20 DIAGNOSIS — G894 Chronic pain syndrome: Secondary | ICD-10-CM | POA: Diagnosis not present

## 2020-11-20 DIAGNOSIS — M62838 Other muscle spasm: Secondary | ICD-10-CM | POA: Diagnosis not present

## 2020-11-20 DIAGNOSIS — M7989 Other specified soft tissue disorders: Secondary | ICD-10-CM | POA: Diagnosis not present

## 2020-11-23 DIAGNOSIS — Z8673 Personal history of transient ischemic attack (TIA), and cerebral infarction without residual deficits: Secondary | ICD-10-CM | POA: Diagnosis not present

## 2020-11-26 DIAGNOSIS — Z8673 Personal history of transient ischemic attack (TIA), and cerebral infarction without residual deficits: Secondary | ICD-10-CM | POA: Diagnosis not present

## 2020-12-24 DIAGNOSIS — Z8673 Personal history of transient ischemic attack (TIA), and cerebral infarction without residual deficits: Secondary | ICD-10-CM | POA: Diagnosis not present

## 2020-12-27 DIAGNOSIS — Z8673 Personal history of transient ischemic attack (TIA), and cerebral infarction without residual deficits: Secondary | ICD-10-CM | POA: Diagnosis not present

## 2021-01-07 DIAGNOSIS — G8222 Paraplegia, incomplete: Secondary | ICD-10-CM | POA: Diagnosis not present

## 2021-01-07 DIAGNOSIS — M5431 Sciatica, right side: Secondary | ICD-10-CM | POA: Diagnosis not present

## 2021-01-07 DIAGNOSIS — M545 Low back pain, unspecified: Secondary | ICD-10-CM | POA: Diagnosis not present

## 2021-01-07 DIAGNOSIS — G8929 Other chronic pain: Secondary | ICD-10-CM | POA: Diagnosis not present

## 2021-01-07 DIAGNOSIS — M5432 Sciatica, left side: Secondary | ICD-10-CM | POA: Diagnosis not present

## 2021-01-07 DIAGNOSIS — M5116 Intervertebral disc disorders with radiculopathy, lumbar region: Secondary | ICD-10-CM | POA: Diagnosis not present

## 2021-01-07 DIAGNOSIS — I69359 Hemiplegia and hemiparesis following cerebral infarction affecting unspecified side: Secondary | ICD-10-CM | POA: Diagnosis not present

## 2021-01-09 ENCOUNTER — Emergency Department (HOSPITAL_BASED_OUTPATIENT_CLINIC_OR_DEPARTMENT_OTHER)
Admission: EM | Admit: 2021-01-09 | Discharge: 2021-01-10 | Disposition: A | Discharge status: Home / Self Care | Payer: Medicare Other | Attending: Emergency Medicine | Admitting: Emergency Medicine

## 2021-01-09 ENCOUNTER — Emergency Department (HOSPITAL_BASED_OUTPATIENT_CLINIC_OR_DEPARTMENT_OTHER): Payer: Medicare Other

## 2021-01-09 ENCOUNTER — Other Ambulatory Visit: Payer: Self-pay

## 2021-01-09 ENCOUNTER — Encounter (HOSPITAL_BASED_OUTPATIENT_CLINIC_OR_DEPARTMENT_OTHER): Payer: Self-pay | Admitting: Emergency Medicine

## 2021-01-09 DIAGNOSIS — Z79899 Other long term (current) drug therapy: Secondary | ICD-10-CM | POA: Diagnosis not present

## 2021-01-09 DIAGNOSIS — M25561 Pain in right knee: Secondary | ICD-10-CM | POA: Diagnosis not present

## 2021-01-09 DIAGNOSIS — W19XXXA Unspecified fall, initial encounter: Secondary | ICD-10-CM | POA: Diagnosis not present

## 2021-01-09 DIAGNOSIS — F17200 Nicotine dependence, unspecified, uncomplicated: Secondary | ICD-10-CM | POA: Diagnosis not present

## 2021-01-09 DIAGNOSIS — M542 Cervicalgia: Secondary | ICD-10-CM | POA: Diagnosis not present

## 2021-01-09 DIAGNOSIS — S8001XA Contusion of right knee, initial encounter: Secondary | ICD-10-CM | POA: Insufficient documentation

## 2021-01-09 DIAGNOSIS — Z043 Encounter for examination and observation following other accident: Secondary | ICD-10-CM | POA: Diagnosis not present

## 2021-01-09 DIAGNOSIS — I10 Essential (primary) hypertension: Secondary | ICD-10-CM | POA: Insufficient documentation

## 2021-01-09 DIAGNOSIS — Z859 Personal history of malignant neoplasm, unspecified: Secondary | ICD-10-CM | POA: Diagnosis not present

## 2021-01-09 DIAGNOSIS — R609 Edema, unspecified: Secondary | ICD-10-CM | POA: Diagnosis not present

## 2021-01-09 DIAGNOSIS — W050XXA Fall from non-moving wheelchair, initial encounter: Secondary | ICD-10-CM | POA: Diagnosis not present

## 2021-01-09 DIAGNOSIS — M545 Low back pain, unspecified: Secondary | ICD-10-CM | POA: Insufficient documentation

## 2021-01-09 DIAGNOSIS — S80911A Unspecified superficial injury of right knee, initial encounter: Secondary | ICD-10-CM | POA: Diagnosis present

## 2021-01-09 DIAGNOSIS — M5459 Other low back pain: Secondary | ICD-10-CM | POA: Diagnosis not present

## 2021-01-09 DIAGNOSIS — Z743 Need for continuous supervision: Secondary | ICD-10-CM | POA: Diagnosis not present

## 2021-01-09 MED ORDER — KETOROLAC TROMETHAMINE 30 MG/ML IJ SOLN
60.0000 mg | Freq: Once | INTRAMUSCULAR | Status: AC
  Administered 2021-01-09: 60 mg via INTRAMUSCULAR
  Filled 2021-01-09: qty 2

## 2021-01-09 NOTE — ED Triage Notes (Signed)
Pt c/o falling today at approx 1530. Pt stated that the pain is in his neck and lower back. Pt states that when he fell he landed on his right knee. Pt is parapalegic with full function of upper extremities. Pt states that the pain is mainly in his neck with spasms in his lower back. Pt aaox3, GCS 15. Pt is having spasms during triage.

## 2021-01-09 NOTE — ED Provider Notes (Signed)
[facility] EMERGENCY DEPARTMENT Provider Note  CSN: [id number] Arrival date & time: 01/09/21 1925    History Chief Complaint  Patient presents with  . Fall    HPI  Ronnie Moore is a 60 y.o. male with history of spinal cord stroke causing functional paraplegia from about the level of his hips reports he was at home earlier today around 3pm getting his hair washed by home aids when he fell out of his wheelchair and landed on his R knee. He states he still is able to feel pain in his legs and is having severe pain in R knee. Also now having pain in his low back and a 'funny feeling' in his neck, although he does not think he injured these areas in his fall. He was brought by EMS for evaluation.    Past Medical History:  Diagnosis Date  . Cancer (HCC)   . Chronic back pain   . Hypertension   . Stroke [facility])     Past Surgical History:  Procedure Laterality Date  . BACK SURGERY    . I & D EXTREMITY Right 06/07/2016   Procedure: IRRIGATION AND DEBRIDEMENT WRIST;  Surgeon: Knute Neu, MD;  Location: [facility] OR;  Service: Plastics;  Laterality: Right;  . KNEE SURGERY      Family History  Problem Relation Age of Onset  . Arthritis Mother   . Hypertension Father   . Heart disease Father   . Diabetes Mellitus II Sister     Social History   Tobacco Use  . Smoking status: Current Every Day Smoker    Packs/day: 0.50  . Smokeless tobacco: Never Used  Substance Use Topics  . Alcohol use: Not Currently    Comment: occ  . Drug use: No     Home Medications Prior to Admission medications   Medication Sig Start Date End Date Taking? Authorizing Provider  amLODipine (NORVASC) 5 MG tablet Take 5 mg by mouth daily. 10/29/19   [provider]  cyclobenzaprine (FEXMID) 7.5 MG tablet Take 1 tablet (7.5 mg total) by mouth 3 (three) times daily as needed for muscle spasms. 11/28/19   Thomasenia Bottoms, MD  docusate sodium (COLACE) 50 MG capsule Take 1 capsule (50 mg  total) by mouth 2 (two) times daily. 06/09/16   Richarda Overlie, MD  DULoxetine (CYMBALTA) 60 MG capsule Take 120 mg by mouth at bedtime. 10/29/19   [provider]  gabapentin (NEURONTIN) 400 MG capsule Take 1 capsule (400 mg total) by mouth 3 (three) times daily. 11/28/19   Thomasenia Bottoms, MD  hydrochlorothiazide (HYDRODIURIL) 25 MG tablet Take 25 mg by mouth daily. 10/29/19   [provider]  loratadine (CLARITIN) 10 MG tablet Take 1 tablet (10 mg total) by mouth daily. 11/29/19   Thomasenia Bottoms, MD  LORazepam (ATIVAN) 2 MG tablet Take 1 tablet (2 mg total) by mouth 3 (three) times daily. Patient not taking: Reported on 06/06/2016 08/30/12   Nelva Nay, MD  losartan (COZAAR) 100 MG tablet Take 0.5 tablets (50 mg total) by mouth daily. 11/28/19   Thomasenia Bottoms, MD  meloxicam (MOBIC) 15 MG tablet Take 1 tablet (15 mg total) by mouth daily. Patient not taking: Reported on 06/06/2016 08/30/12   Nelva Nay, MD  nicotine (NICODERM CQ - DOSED IN MG/24 HOURS) 14 mg/24hr patch Place 1 patch (14 mg total) onto the skin daily. 11/29/19   Thomasenia Bottoms, MD  Olmesartan-Amlodipine-HCTZ [facility]) 40-10-25 MG TABS Take 1 tablet by mouth daily.  [provider]  pantoprazole (PROTONIX) 40 MG tablet Take 1 tablet (40 mg total) by mouth daily. 06/09/16   Reyne Dumas, MD  polyethylene glycol (MIRALAX / GLYCOLAX) packet Take 17 g by mouth 2 (two) times daily. 06/09/16   Reyne Dumas, MD  potassium chloride 20 MEQ/15ML (10%) SOLN Take 30 mLs (40 mEq total) by mouth daily. 06/09/16   Reyne Dumas, MD  sodium chloride (OCEAN) 0.65 % SOLN nasal spray Place 1 spray into both nostrils as needed for congestion. 11/28/19   Thornell Mule, MD  temazepam (RESTORIL) 15 MG capsule Take 1 capsule (15 mg total) by mouth at bedtime as needed for sleep. 06/09/16   Reyne Dumas, MD     Allergies    Metaxalone   Review of Systems   Review of Systems A comprehensive review of systems was completed  and negative except as noted in HPI.    Physical Exam BP 140/68 (BP Location: Right Arm)   Pulse 86   Temp 98.5 F (36.9 C) (Oral)   Resp 18   Ht 5\' 8"  (1.727 m)   Wt 118 kg   SpO2 100%   BMI 39.55 kg/m   Physical Exam Vitals and nursing note reviewed.  Constitutional:      Appearance: Normal appearance.  HENT:     Head: Normocephalic and atraumatic.     Nose: Nose normal.     Mouth/Throat:     Mouth: Mucous membranes are moist.  Eyes:     Extraocular Movements: Extraocular movements intact.     Conjunctiva/sclera: Conjunctivae normal.  Neck:     Comments: Midline C-spine tenderness Cardiovascular:     Rate and Rhythm: Normal rate.  Pulmonary:     Effort: Pulmonary effort is normal.     Breath sounds: Normal breath sounds.  Abdominal:     General: Abdomen is flat.     Palpations: Abdomen is soft.     Tenderness: There is no abdominal tenderness.  Musculoskeletal:        General: No swelling. Normal range of motion.     Cervical back: Neck supple.     Comments: Tenderness to palpation of midline L spine; tenderness and swelling to R knee, no bruising or deformity, normal pulses distally  Skin:    General: Skin is warm and dry.  Neurological:     Mental Status: He is alert. Mental status is at baseline.  Psychiatric:        Mood and Affect: Mood normal.      ED Results / Procedures / Treatments   Labs (all labs ordered are listed, but only abnormal results are displayed) Labs Reviewed - No data to display  EKG None   Radiology DG Lumbar Spine Complete  Result Date: 01/09/2021 CLINICAL DATA:  Fall, low back pain EXAM: LUMBAR SPINE - COMPLETE 4+ VIEW COMPARISON:  11/16/2019 FINDINGS: Diffuse degenerative disc and advanced degenerative facet disease throughout the lumbar spine. Normal alignment. No fracture. SI joints symmetric and unremarkable. No change since prior study. IMPRESSION: Degenerative disc and facet disease.  No acute bony abnormality.  Electronically Signed   By: Rolm Baptise M.D.   On: 01/09/2021 20:44   CT Cervical Spine Wo Contrast  Result Date: 01/09/2021 CLINICAL DATA:  Status post fall EXAM: CT CERVICAL SPINE WITHOUT CONTRAST TECHNIQUE: Multidetector CT imaging of the cervical spine was performed without intravenous contrast. Multiplanar CT image reconstructions were also generated. COMPARISON:  CT cervical spine of 04/13/2019. FINDINGS: Alignment: Normal. Skull base and vertebrae:  Multilevel at least moderate degenerative changes of the spine with associated at least moderate left osseous neural foraminal stenosis. No acute fracture. No aggressive appearing focal osseous lesion or focal pathologic process. Soft tissues and spinal canal: No prevertebral fluid or swelling. No visible canal hematoma. Upper chest: Unremarkable. Other: Atherosclerotic plaque of the carotid arteries within the neck. IMPRESSION: No acute displaced fracture or traumatic listhesis of the cervical spine Electronically Signed   By: Iven Finn M.D.   On: 01/09/2021 20:17   DG Knee Complete 4 Views Right  Result Date: 01/09/2021 CLINICAL DATA:  Fall, right knee pain EXAM: RIGHT KNEE - COMPLETE 4+ VIEW COMPARISON:  None. FINDINGS: Tricompartment degenerative changes in the right knee with joint space narrowing and spurring. Moderate joint effusion. No acute bony abnormality. Specifically, no fracture, subluxation, or dislocation. IMPRESSION: Degenerative changes with associated joint effusion. No acute bony abnormality. Electronically Signed   By: Rolm Baptise M.D.   On: 01/09/2021 20:44    Procedures Procedures  Medications Ordered in the ED Medications  ketorolac (TORADOL) 30 MG/ML injection 60 mg (60 mg Intramuscular Given 01/09/21 2002)     MDM Rules/Calculators/A&P MDM  ED Course  I have reviewed the triage vital signs and the nursing notes.  Pertinent labs & imaging results that were available during my care of the patient were reviewed  by me and considered in my medical decision making (see chart for details).  Clinical Course as of 01/09/21 2128  Fri Jan 09, 2021  2124 Patient's imaging results reviewed and neg for acute injury. Patient reports minimal relief from Toradol. Advised he does not have an injury that would necessitate narcotics and he became very angry, yell that we are 'a bunch of [expletive] horse doctors'. It was noted from his PDMP report that he has been getting buprenorphine from his pain management clinic and that he also has a history of heroin abuse from a visit to [facility] last year. Patient will be discharged home.  [CS]    Clinical Course User Index [CS] Truddie Hidden, MD    Final Clinical Impression(s) / ED Diagnoses Final diagnoses:  Fall, initial encounter  Contusion of right knee, initial encounter  Neck pain  Midline low back pain without sciatica, unspecified chronicity    Rx / DC Orders ED Discharge Orders    None       Truddie Hidden, MD 01/09/21 2128

## 2021-01-09 NOTE — ED Notes (Signed)
Patient transported to CT 

## 2021-01-09 NOTE — ED Notes (Signed)
ED Provider at bedside. 

## 2021-01-10 DIAGNOSIS — M2550 Pain in unspecified joint: Secondary | ICD-10-CM | POA: Diagnosis not present

## 2021-01-10 DIAGNOSIS — Z7401 Bed confinement status: Secondary | ICD-10-CM | POA: Diagnosis not present

## 2021-01-10 DIAGNOSIS — Z743 Need for continuous supervision: Secondary | ICD-10-CM | POA: Diagnosis not present

## 2021-01-10 DIAGNOSIS — R5381 Other malaise: Secondary | ICD-10-CM | POA: Diagnosis not present

## 2021-01-10 DIAGNOSIS — W19XXXA Unspecified fall, initial encounter: Secondary | ICD-10-CM | POA: Diagnosis not present

## 2021-02-17 DIAGNOSIS — R238 Other skin changes: Secondary | ICD-10-CM | POA: Diagnosis not present

## 2021-02-17 DIAGNOSIS — R600 Localized edema: Secondary | ICD-10-CM | POA: Diagnosis not present

## 2021-02-17 DIAGNOSIS — G894 Chronic pain syndrome: Secondary | ICD-10-CM | POA: Diagnosis not present

## 2021-02-17 DIAGNOSIS — M7989 Other specified soft tissue disorders: Secondary | ICD-10-CM | POA: Diagnosis not present

## 2021-02-17 DIAGNOSIS — I10 Essential (primary) hypertension: Secondary | ICD-10-CM | POA: Diagnosis not present

## 2021-02-17 DIAGNOSIS — R7303 Prediabetes: Secondary | ICD-10-CM | POA: Diagnosis not present

## 2021-02-17 DIAGNOSIS — K219 Gastro-esophageal reflux disease without esophagitis: Secondary | ICD-10-CM | POA: Diagnosis not present

## 2021-02-18 IMAGING — DX DG CHEST 2V
2 series · 2 of 2 positions shown · non-contrast
Comparison: 05/19/2017.

CLINICAL DATA: Fell 1 week ago.  Pain.  Smoker.

EXAM:
CHEST - 2 VIEW

[chest lat]
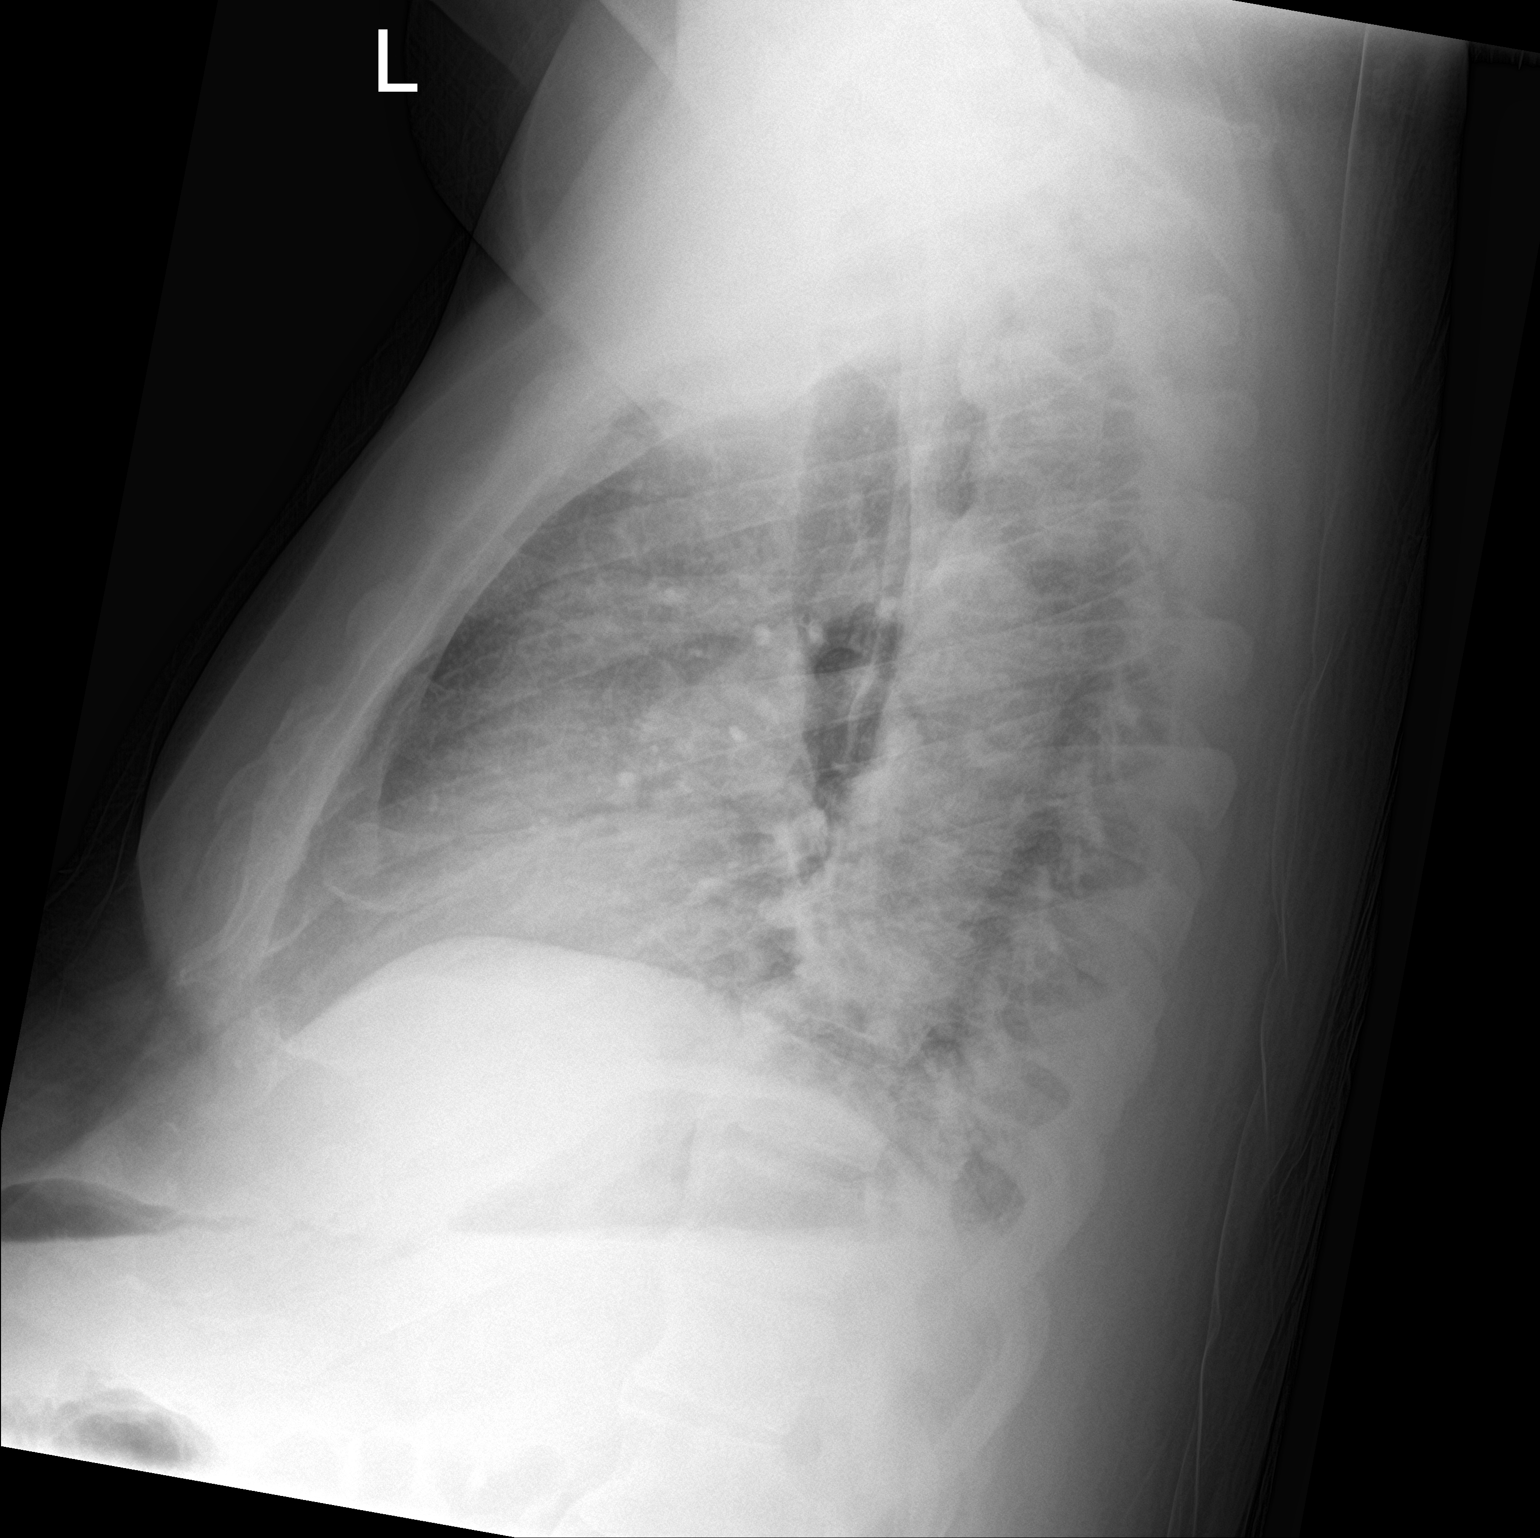

[chest ap strecther]
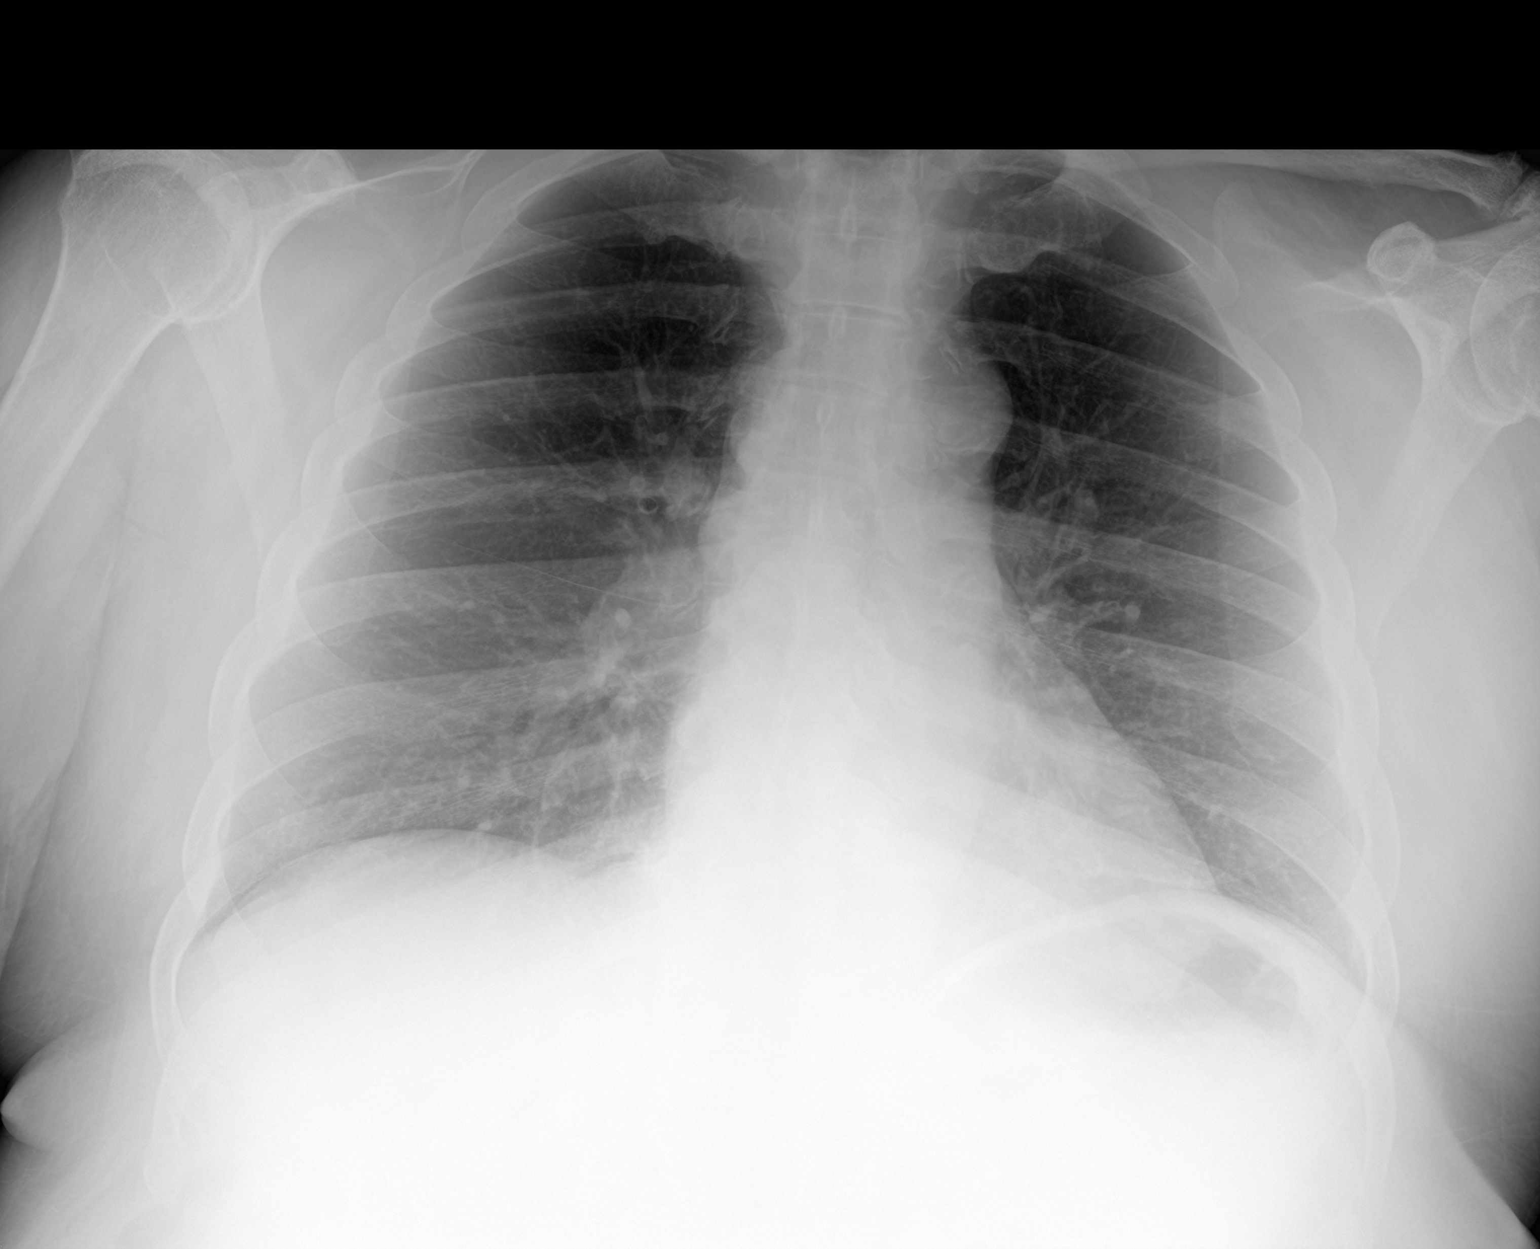

[2 of 2 positions shown; findings below may reference images not displayed]

FINDINGS: Cardiac silhouette is normal in size. No mediastinal or hilar masses
or evidence of adenopathy.

Clear lungs.  No pleural effusion or pneumothorax.

Skeletal structures are intact.
IMPRESSION: No active cardiopulmonary disease.

## 2021-02-18 IMAGING — US US ABDOMEN LIMITED
1 series · 14 of 25 positions shown · non-contrast
Comparison: CT scan dated 11/16/2019

CLINICAL DATA: Cholecystitis. Abdominal pain. Abnormal CT scan of
the abdomen dated 11/16/2019

EXAM:
ULTRASOUND ABDOMEN LIMITED RIGHT UPPER QUADRANT

[Series 1: us abdomen limited · 14 of 39 slices shown]
[im 1/39]
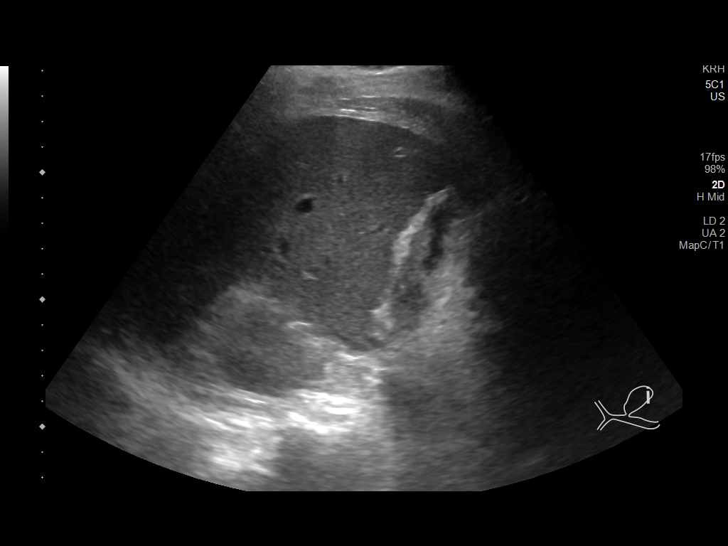
[im 4/39]
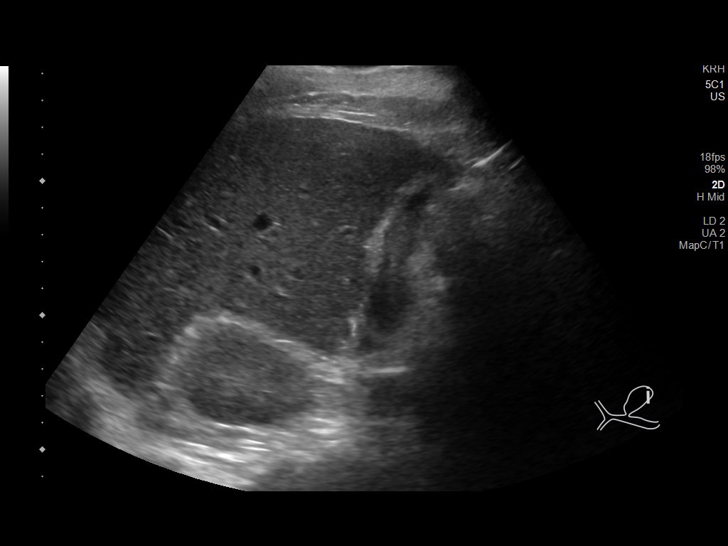
[im 7/39]
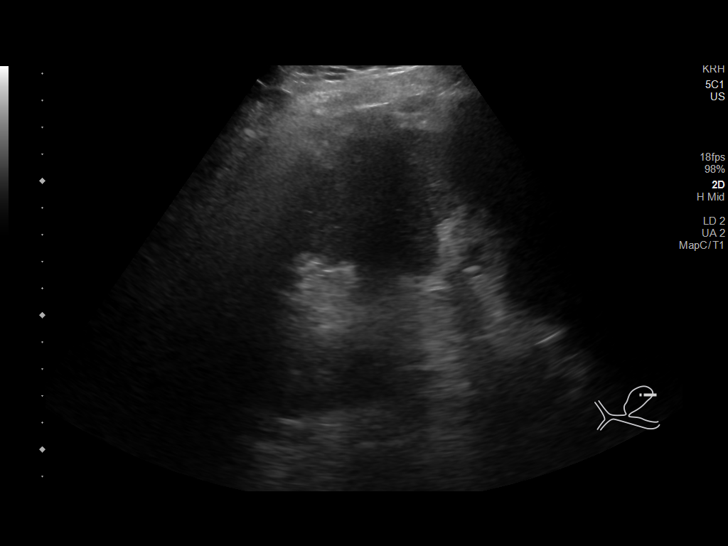
[im 10/39]
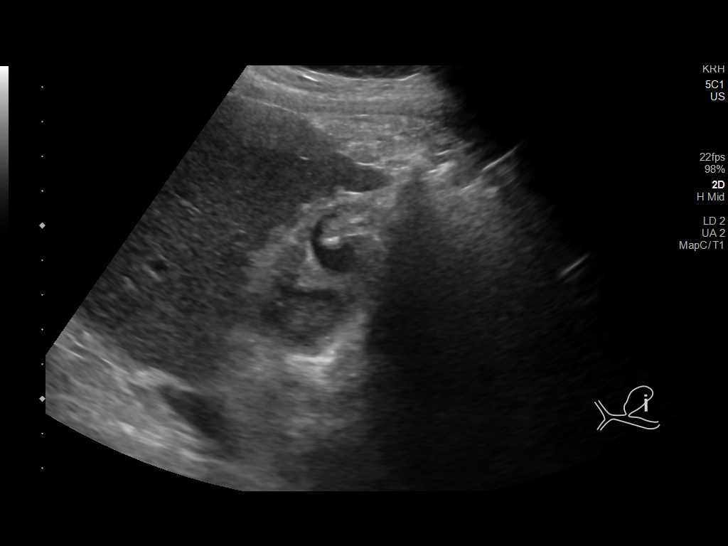
[im 13/39]
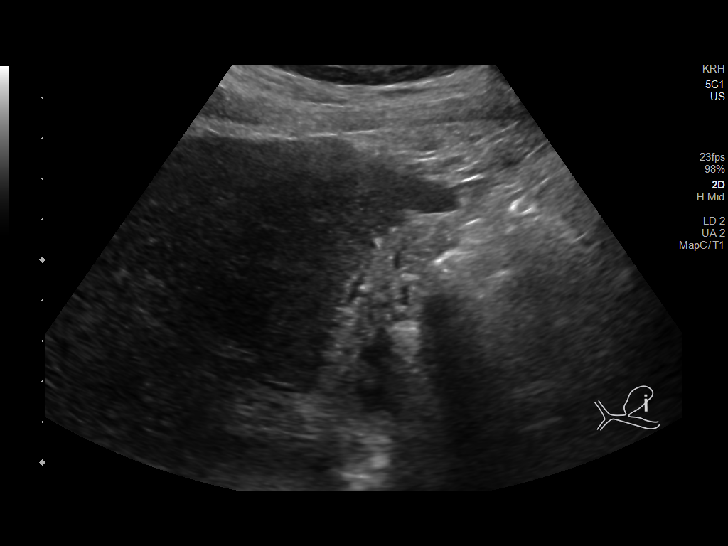
[im 15/39]
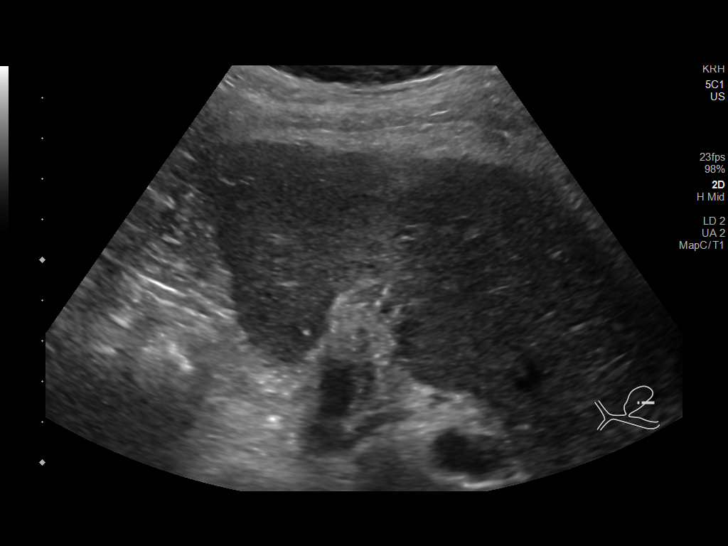
[im 18/39]
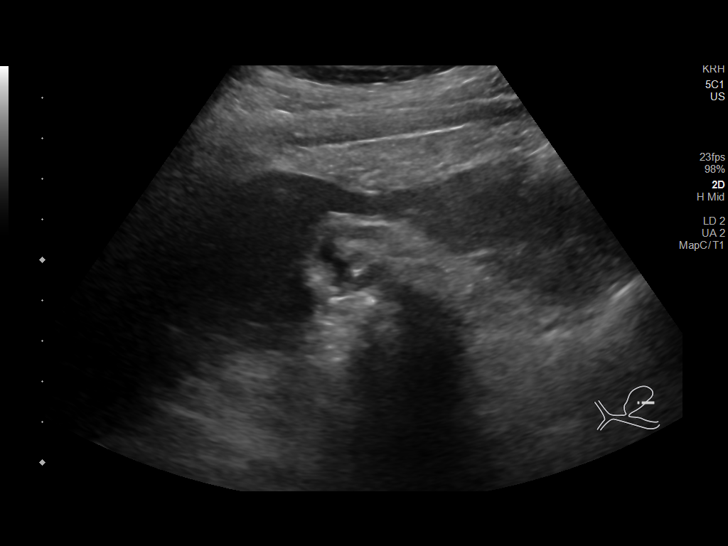
[im 21/39]
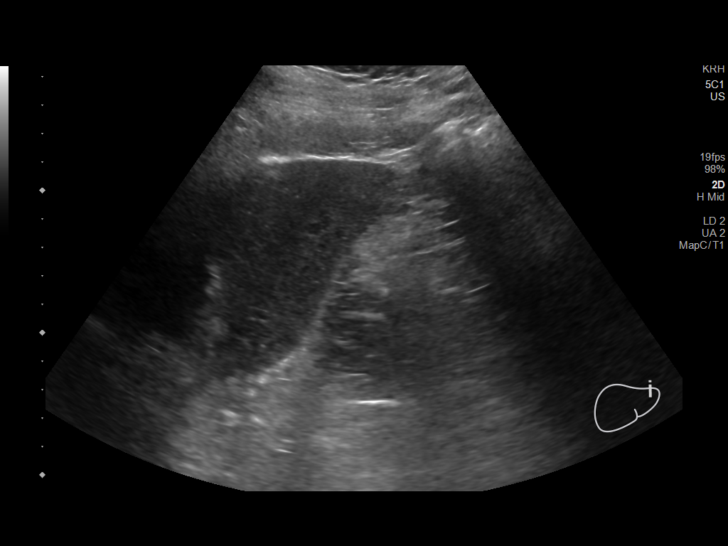
[im 24/39]
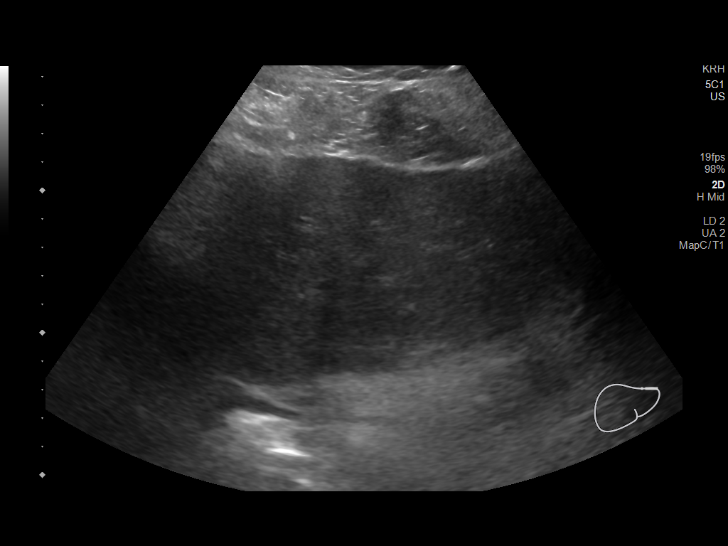
[im 26/39]
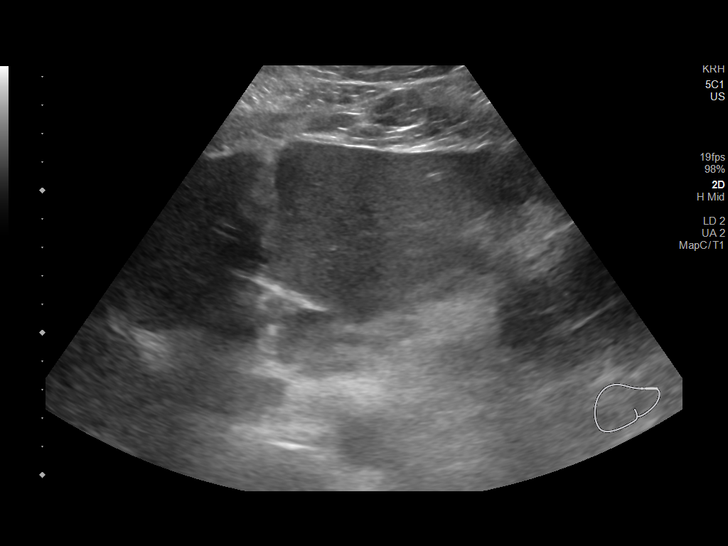
[im 29/39]
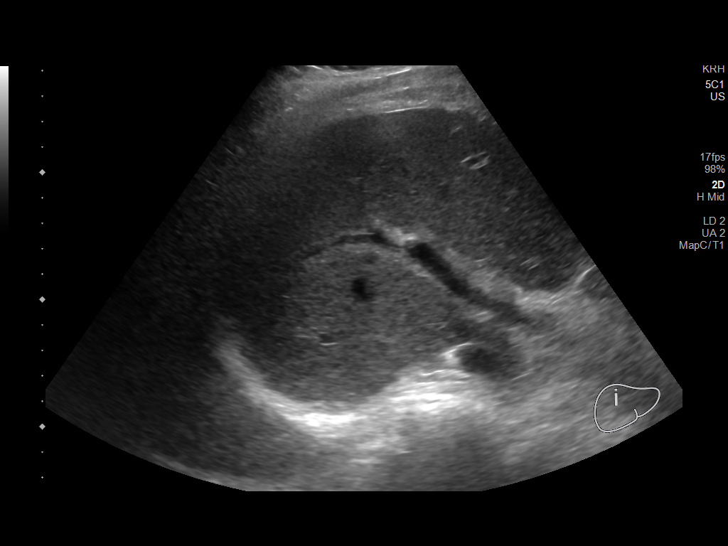
[im 32/39]
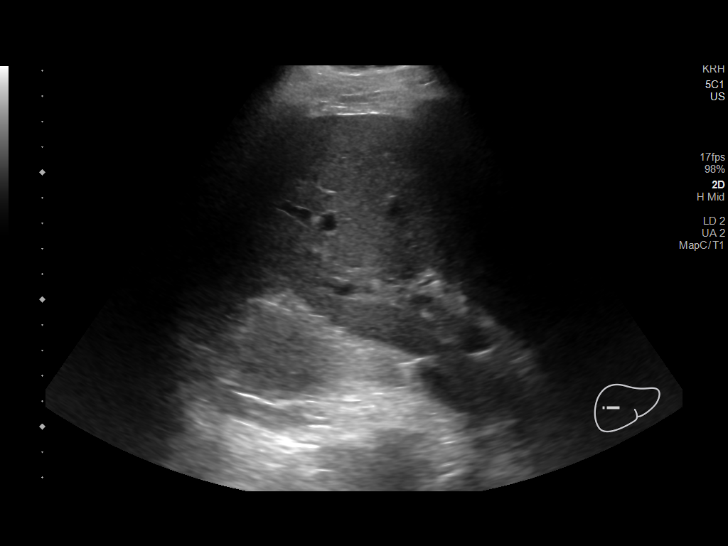
[im 35/39]
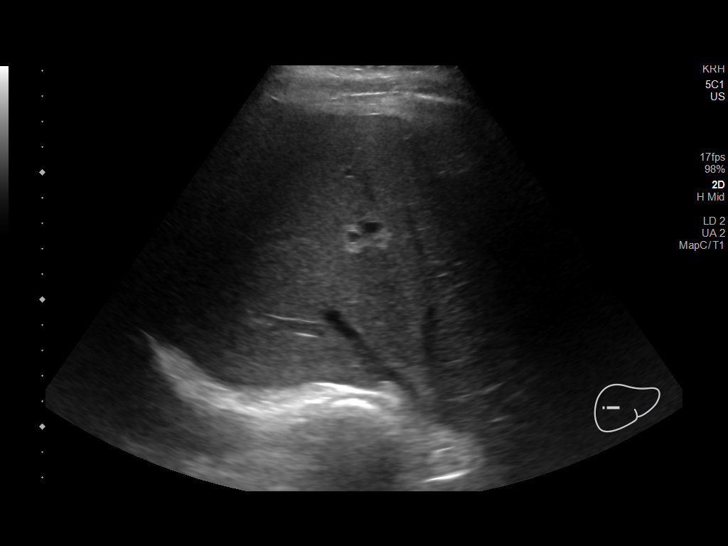
[im 39/39]
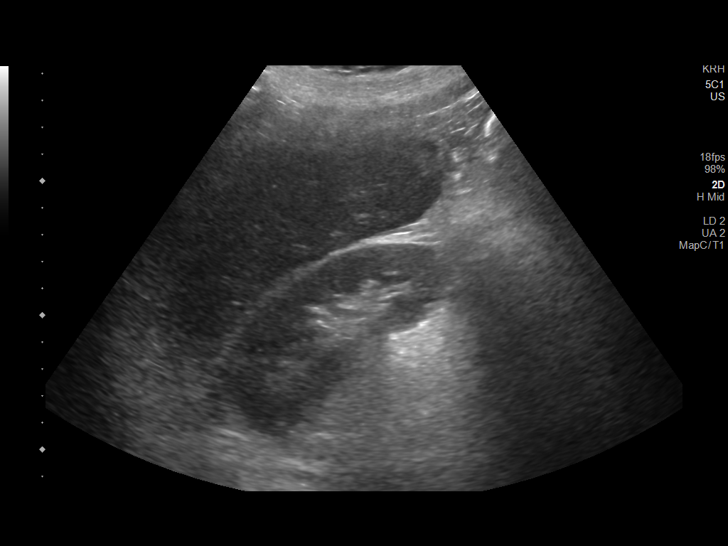

[14 of 25 positions shown; findings below may reference images not displayed]

FINDINGS: Gallbladder:

The gallbladder is contracted with a thick irregular wall. No
discrete stones. Positive sonographic Murphy's sign. Small amount of
pericholecystic fluid.

Common bile duct:

Diameter: 2 mm, normal.

Liver:

No focal lesion identified. Within normal limits in parenchymal
echogenicity. Portal vein is patent on color Doppler imaging with
normal direction of blood flow towards the liver.

Other: None.
IMPRESSION: Findings are consistent with acalculous cholecystitis.

## 2021-02-18 IMAGING — CT CT ABD-PELV W/ CM
2 of 5 series · 16 of 46 positions shown, 18 images · IV contrast (Omnipaque)
Comparison: 08/05/2016

CLINICAL DATA: Acute generalized abdominal pain.  Fall last week.

EXAM:
CT ABDOMEN AND PELVIS WITH CONTRAST
TECHNIQUE: Multidetector CT imaging of the abdomen and pelvis was performed
using the standard protocol following bolus administration of
intravenous contrast.
CONTRAST:  100mL OMNIPAQUE IOHEXOL 300 MG/ML  SOLN

[Series 2: axial st · axial · 0.95mm/px · z∈[-649,-229]mm · 13 of 94 slices shown, 15 images]
[im 5/94  soft-tissue]
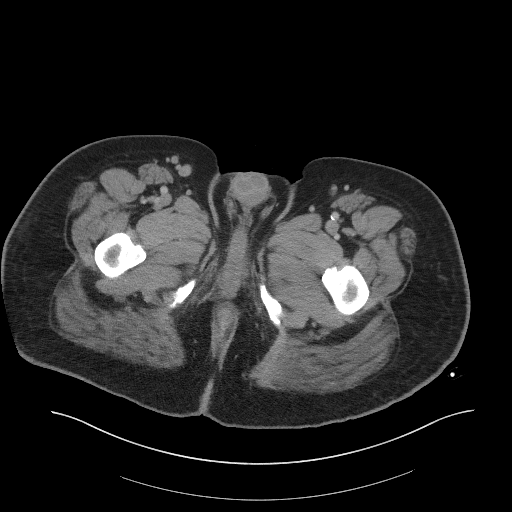
[im 5/94  bone]
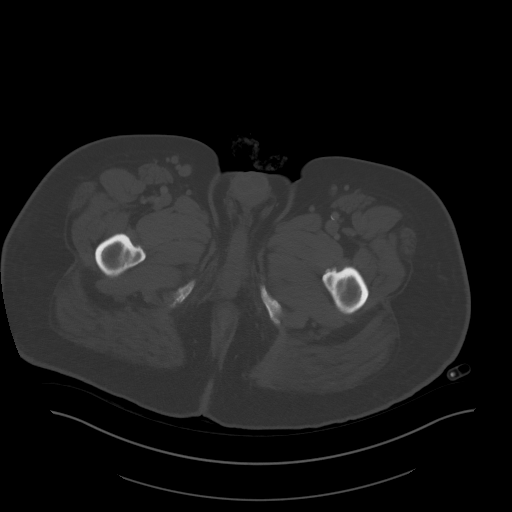
[im 14/94  soft-tissue]
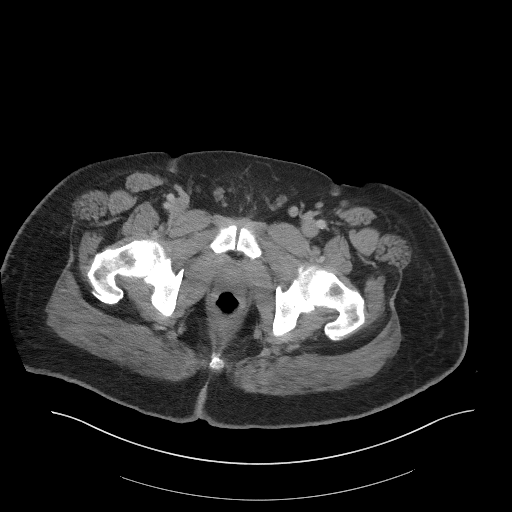
[im 19/94  soft-tissue]
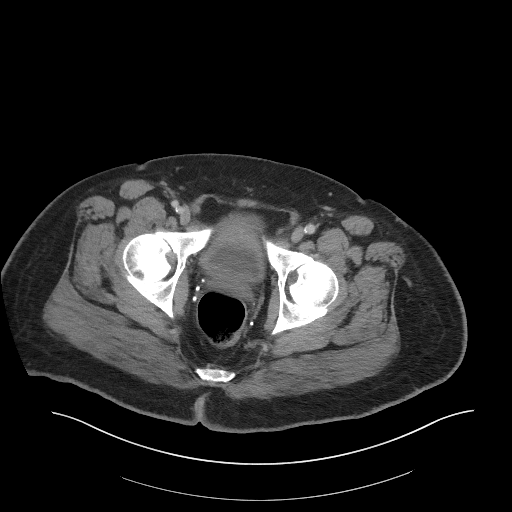
[im 28/94  soft-tissue]
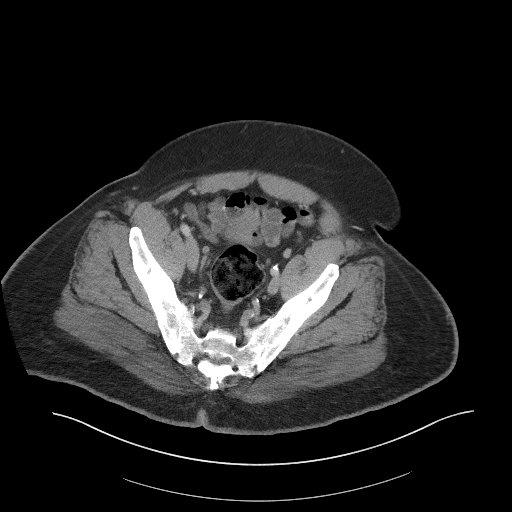
[im 33/94  soft-tissue]
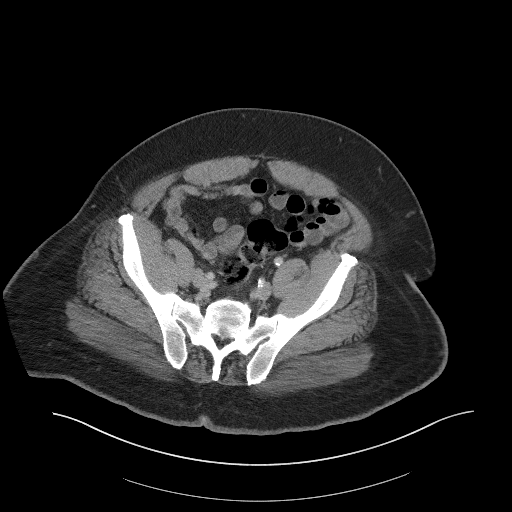
[im 42/94  soft-tissue]
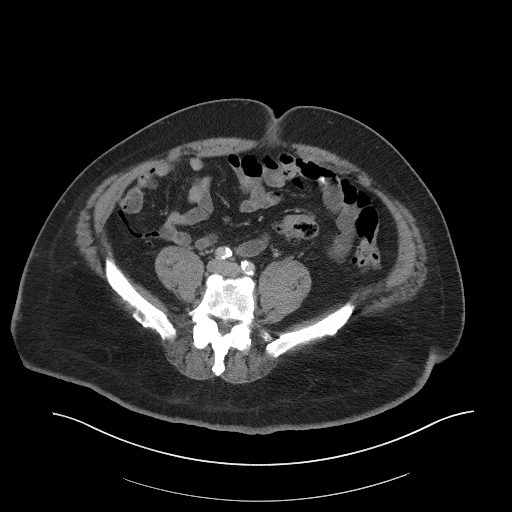
[im 47/94  soft-tissue]
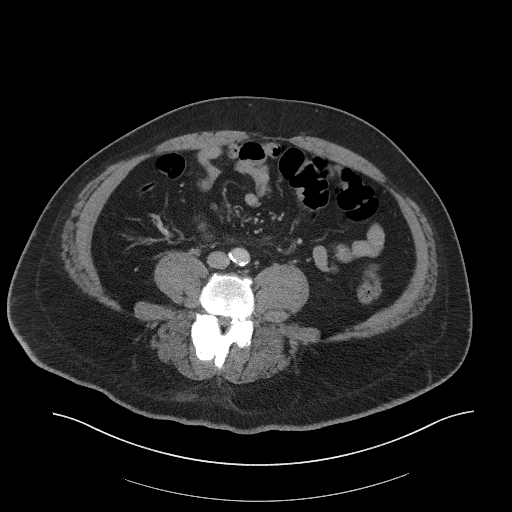
[im 52/94  soft-tissue]
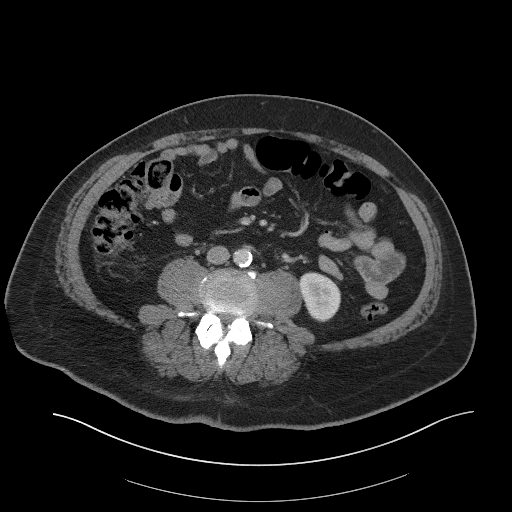
[im 61/94  soft-tissue]
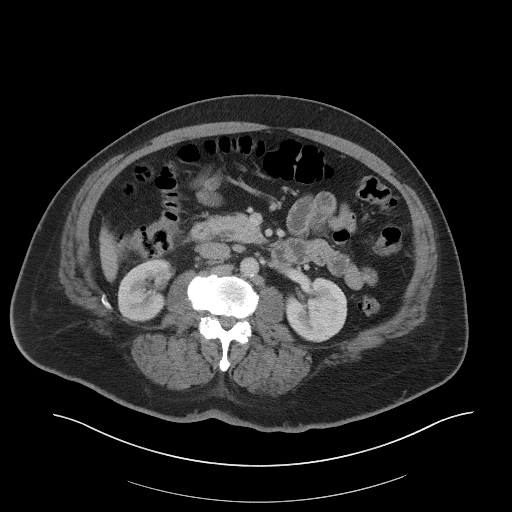
[im 61/94  bone]
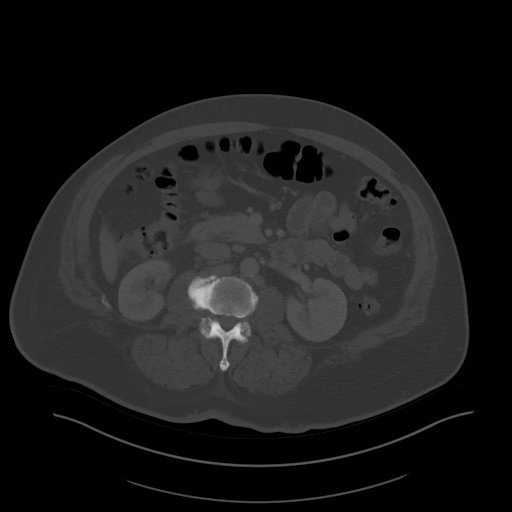
[im 66/94  soft-tissue]
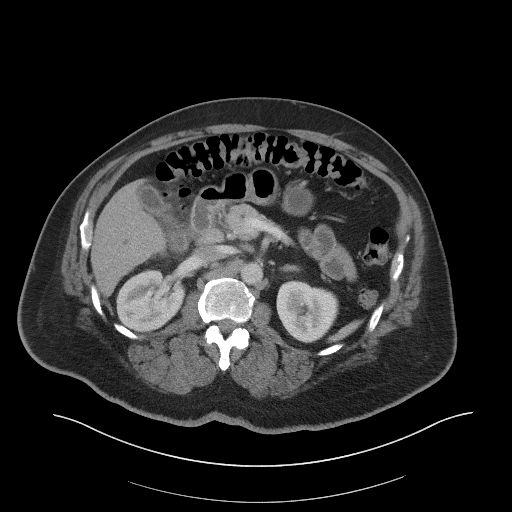
[im 75/94  soft-tissue]
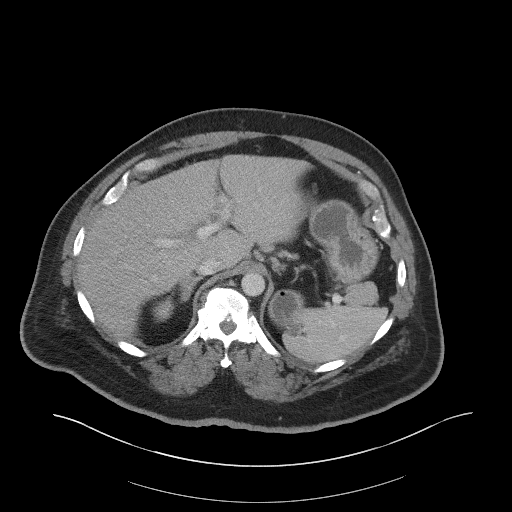
[im 80/94  soft-tissue]
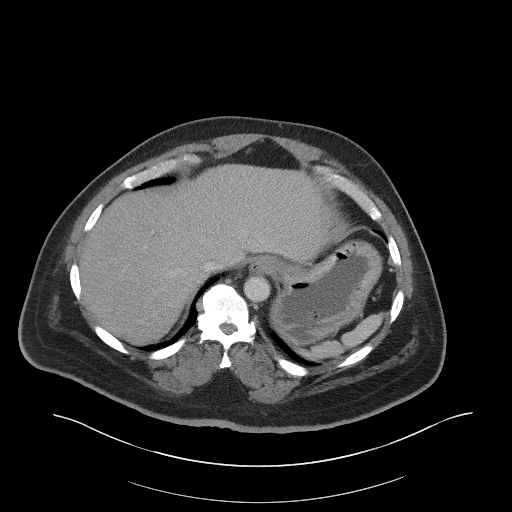
[im 89/94  soft-tissue]
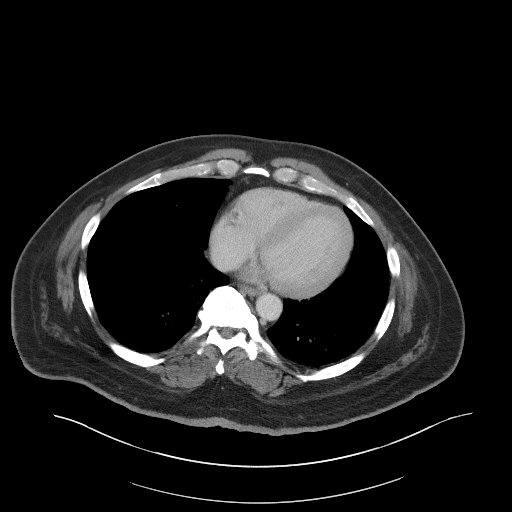

[Series 5: coronal st · coronal · 0.87mm/px · 3 of 109 slices shown]
[im 37/109  soft-tissue]
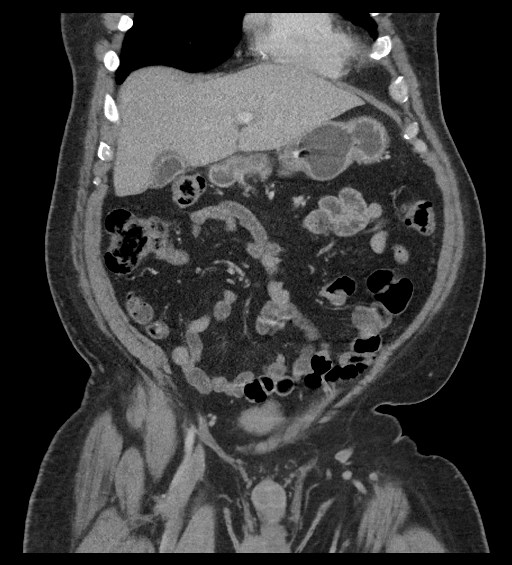
[im 49/109  soft-tissue]
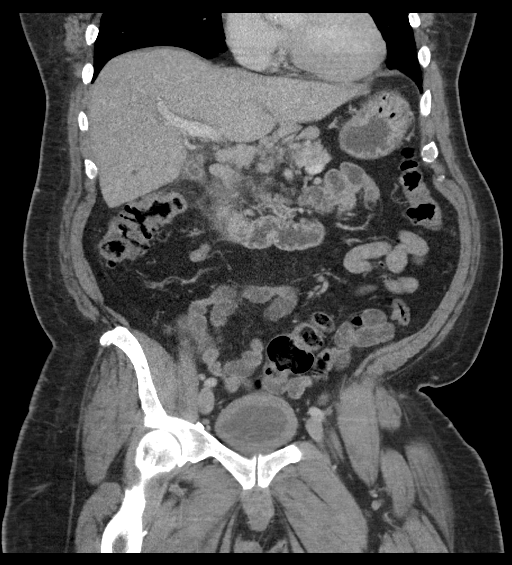
[im 61/109  soft-tissue]
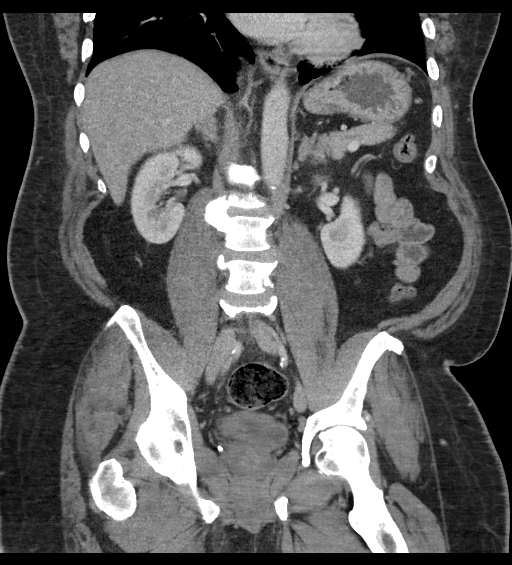

[16 of 46 positions shown; findings below may reference images not displayed]

FINDINGS: Lower chest: Clear lung bases.

Hepatobiliary: There is gallbladder wall thickening with hazy
infiltration in the pericholecystic fat. No visualized gallstone.
Gallbladder is mildly distended. No bile duct dilation.

4 mm low-attenuation lesion in the right lobe, segment 6, likely a
cyst or hemangioma, but too small to characterized. This is not
evident on the prior CT. No other liver masses or lesions. Liver
normal in size. No liver contusion or laceration.

Pancreas: Unremarkable. No pancreatic ductal dilatation or
surrounding inflammatory changes.

Spleen: Normal in size without focal abnormality.

Adrenals/Urinary Tract: No adrenal masses.

Kidneys normal size, orientation and position with symmetric
enhancement and excretion. 7 mm posterior upper pole right renal
cyst. No other renal masses, no stones and no hydronephrosis.
Ureters are normal in course and in caliber. Bladder wall appears
thickened accentuated by lack of distension. No bladder mass or
stone.

Stomach/Bowel: Stomach is within normal limits. Appendix appears
normal. No evidence of bowel wall thickening, distention, or
inflammatory changes.

Vascular/Lymphatic: Prominent gastrohepatic ligament and periceliac
lymph nodes, largest 12 mm in short axis. These appear increased
from the prior CT. No other adenopathy. Aortic atherosclerosis. No
aneurysm.

Reproductive: Unremarkable.

Other: No abdominal wall hernia or abnormality. No abdominopelvic
ascites.

Musculoskeletal: No fracture or acute finding. No osteoblastic or
osteolytic lesions.
IMPRESSION: 1. Gallbladder wall thickening with hazy adjacent inflammation.
Findings support acute cholecystitis, although no gallstone is seen.
Recommend follow-up limited right upper quadrant ultrasound for
further assessment.
2. Prominent to mildly enlarged Peri celiac and gastrohepatic
ligament lymph nodes. These are most likely reactive.
3. Bladder wall appears thickened which is similar to the prior CT.
Consider cystitis if there are consistent clinical findings.
4. No other evidence of an acute abnormality.
5. Aortic atherosclerosis.

## 2021-02-20 IMAGING — US US ABDOMEN LIMITED
1 series · 14 of 25 positions shown · non-contrast
Comparison: 11/16/2019 ultrasound and CT Abdomen and Pelvis.

CLINICAL DATA: 58-year-old male with right upper quadrant pain,
suspected acute cholecystitis on ultrasound and CT Abdomen and
Pelvis 2 days ago.

EXAM:
ULTRASOUND ABDOMEN LIMITED RIGHT UPPER QUADRANT

[Series 1: us abdomen limited · 14 of 77 slices shown]
[im 1/77]
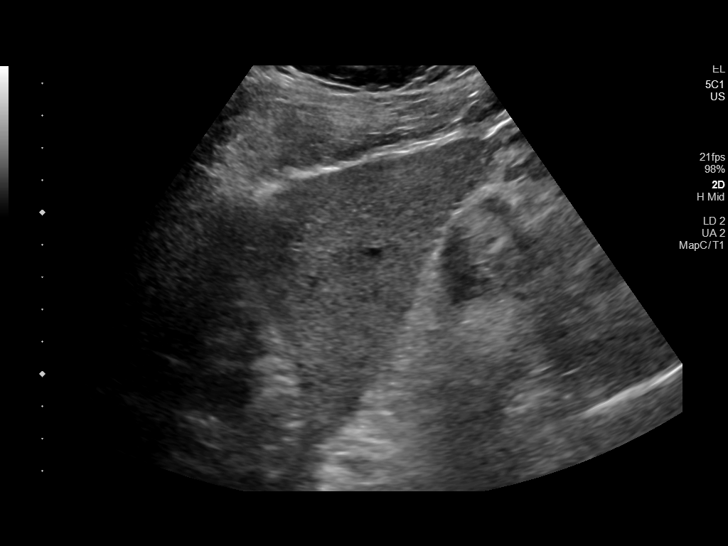
[im 7/77]
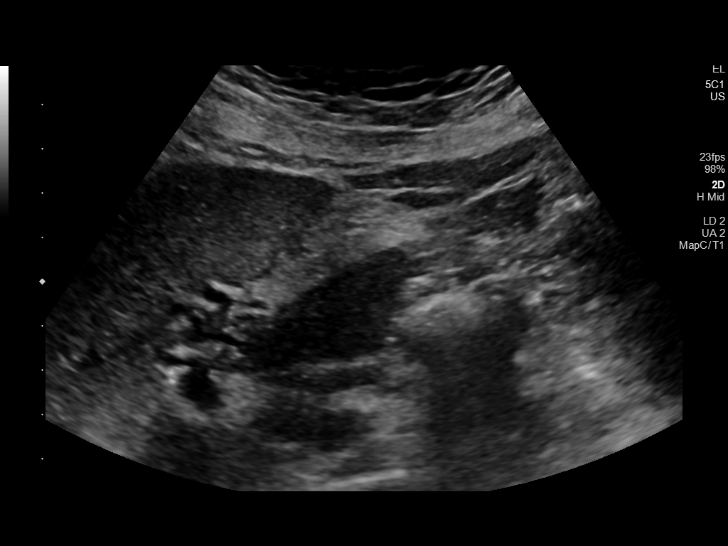
[im 13/77]
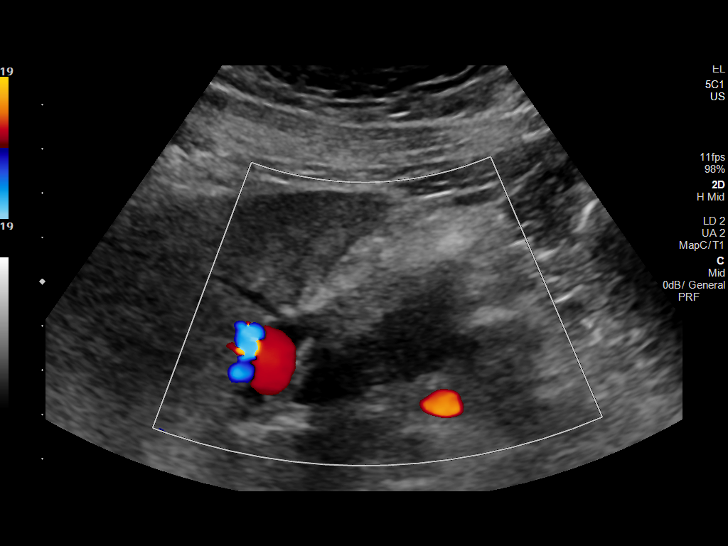
[im 20/77]
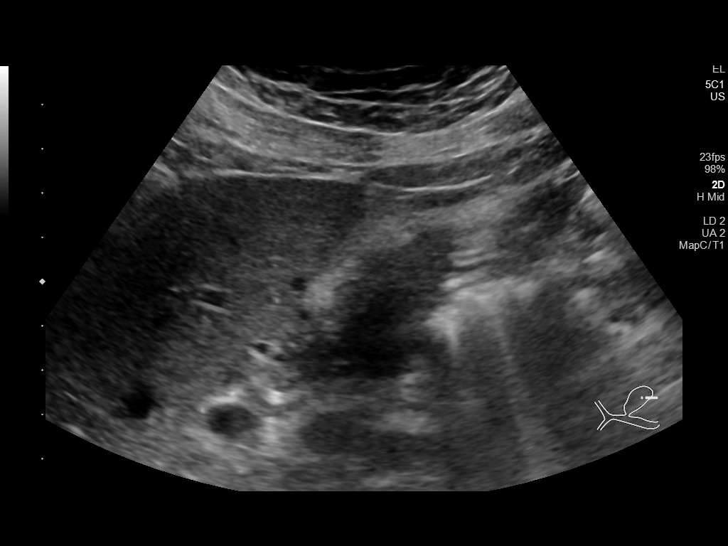
[im 26/77]
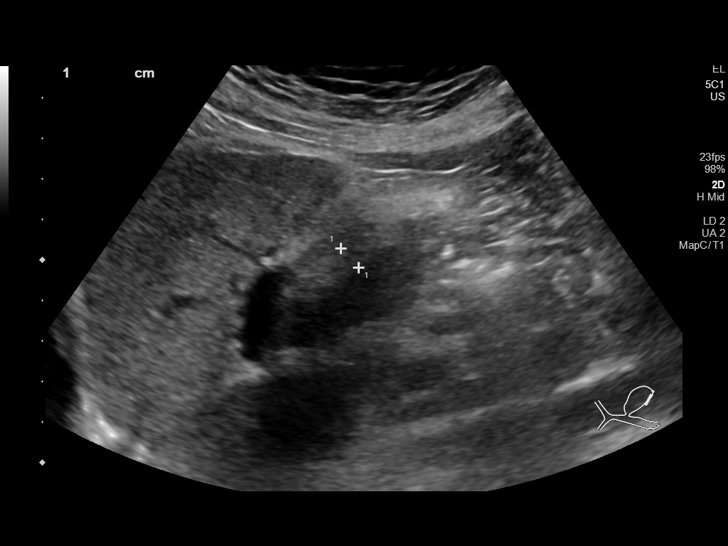
[im 29/77]
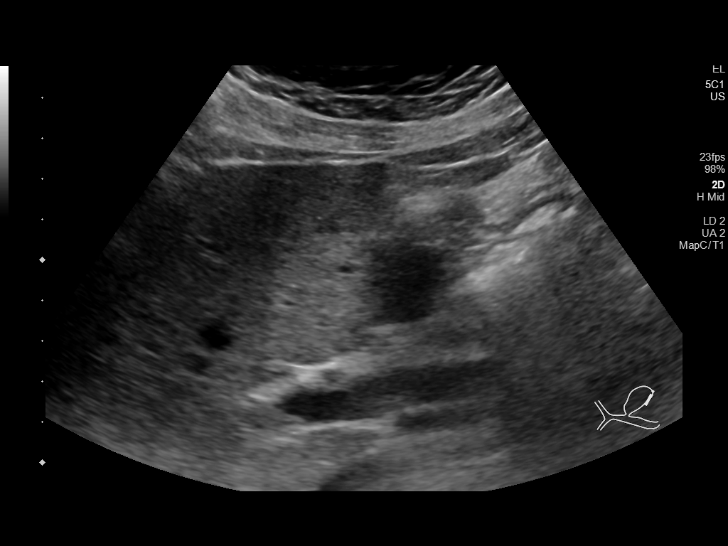
[im 35/77]
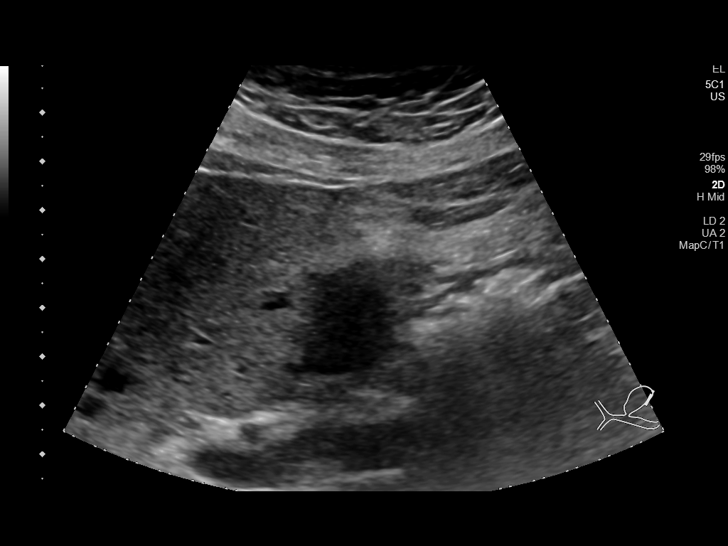
[im 42/77]
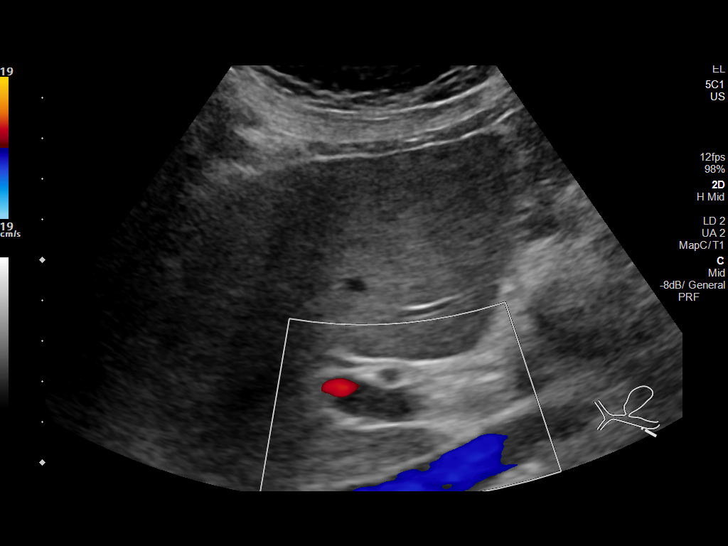
[im 48/77]
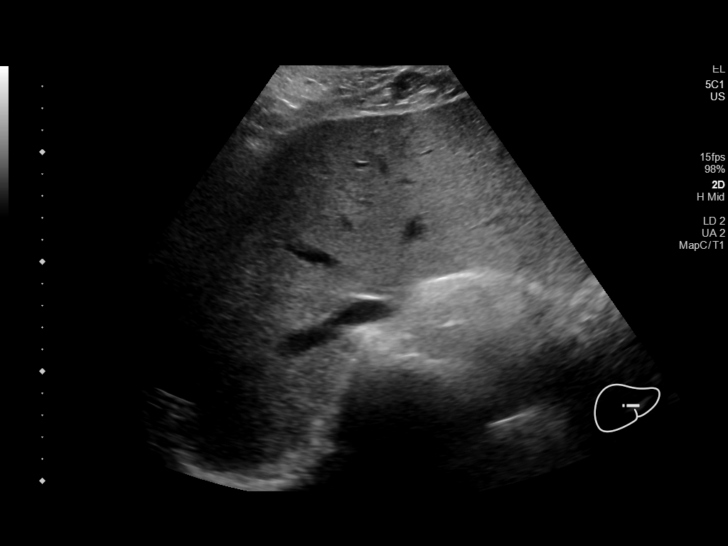
[im 51/77]
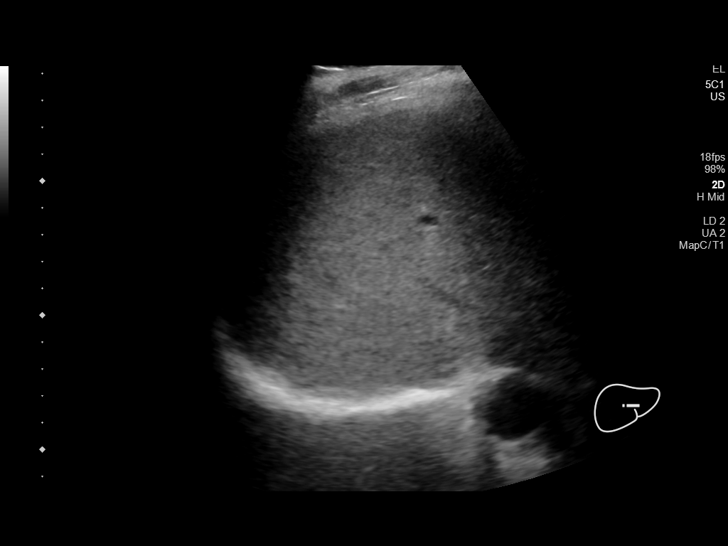
[im 58/77]
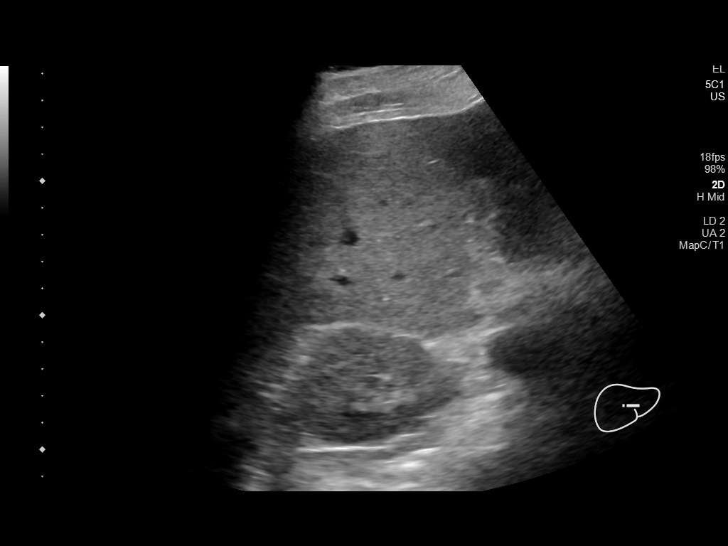
[im 64/77]
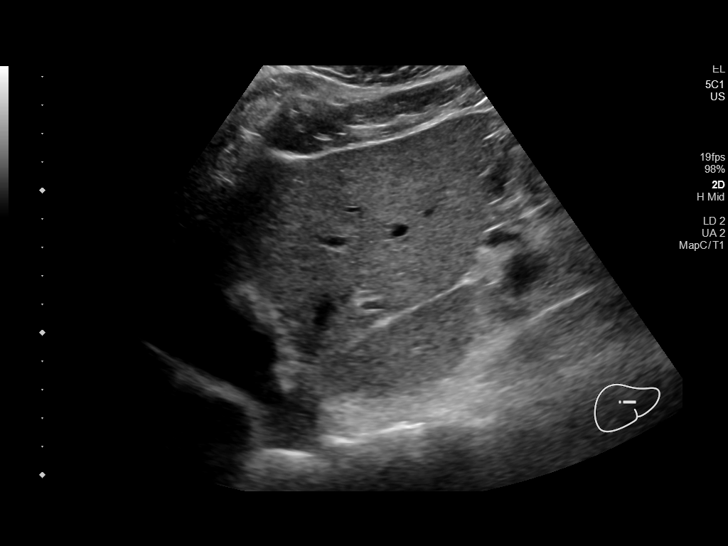
[im 70/77]
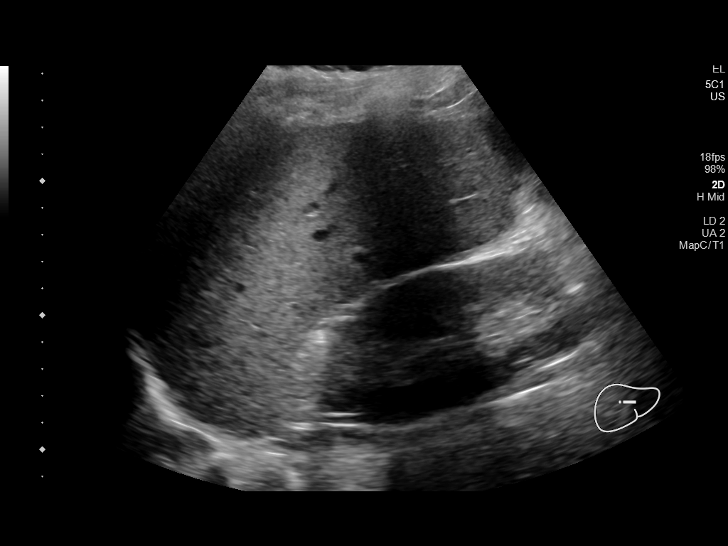
[im 77/77]
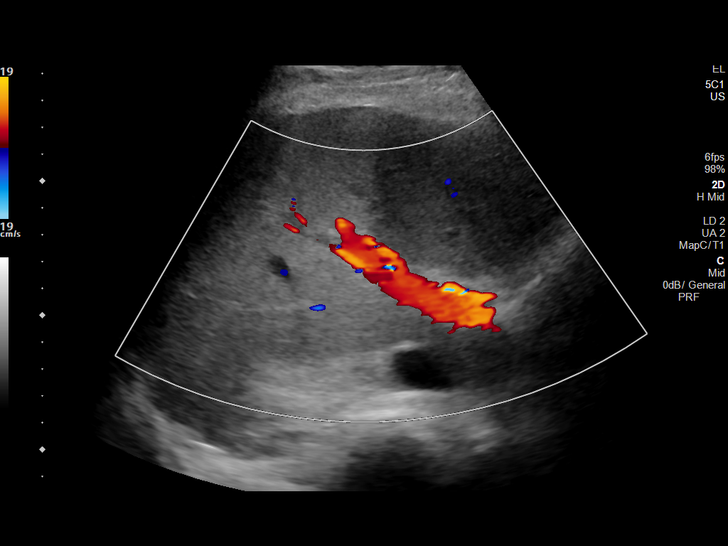

[14 of 25 positions shown; findings below may reference images not displayed]

FINDINGS: Gallbladder:

Persistent gallbladder wall thickening up to 6-7 millimeters with
positive sonographic Murphy sign elicited. As before, no sludge or
stones are identified within the lumen. Trace if any pericholecystic
fluid.

Common bile duct:

Diameter: 2 millimeters, normal.

Liver:

No focal lesion identified. Within normal limits in parenchymal
echogenicity. Portal vein is patent on color Doppler imaging with
normal direction of blood flow towards the liver.

Other: Negative visible right kidney.
IMPRESSION: 1. Continued moderate to severe gallbladder wall thickening and
sonographic Murphy sign, but absence of visible gallstones as
before. Trace if any pericholecystic fluid.
Differential considerations include chronic cholecystitis and
gallbladder neoplasm (less likely) in addition to acute
cholecystitis.
2. Negative liver and no evidence of bile duct obstruction.

## 2021-02-27 DIAGNOSIS — G894 Chronic pain syndrome: Secondary | ICD-10-CM | POA: Diagnosis not present

## 2021-02-27 DIAGNOSIS — M545 Low back pain, unspecified: Secondary | ICD-10-CM | POA: Diagnosis not present

## 2021-02-27 DIAGNOSIS — S31103A Unspecified open wound of abdominal wall, right lower quadrant without penetration into peritoneal cavity, initial encounter: Secondary | ICD-10-CM | POA: Diagnosis not present

## 2021-02-27 DIAGNOSIS — M5431 Sciatica, right side: Secondary | ICD-10-CM | POA: Diagnosis not present

## 2021-02-27 DIAGNOSIS — G8929 Other chronic pain: Secondary | ICD-10-CM | POA: Diagnosis not present

## 2021-02-27 DIAGNOSIS — Z743 Need for continuous supervision: Secondary | ICD-10-CM | POA: Diagnosis not present

## 2021-02-27 DIAGNOSIS — M5116 Intervertebral disc disorders with radiculopathy, lumbar region: Secondary | ICD-10-CM | POA: Diagnosis not present

## 2021-02-27 DIAGNOSIS — L03311 Cellulitis of abdominal wall: Secondary | ICD-10-CM | POA: Diagnosis not present

## 2021-02-27 DIAGNOSIS — B9562 Methicillin resistant Staphylococcus aureus infection as the cause of diseases classified elsewhere: Secondary | ICD-10-CM | POA: Diagnosis not present

## 2021-02-27 DIAGNOSIS — Z79891 Long term (current) use of opiate analgesic: Secondary | ICD-10-CM | POA: Diagnosis not present

## 2021-02-27 DIAGNOSIS — R109 Unspecified abdominal pain: Secondary | ICD-10-CM | POA: Diagnosis not present

## 2021-02-27 DIAGNOSIS — L089 Local infection of the skin and subcutaneous tissue, unspecified: Secondary | ICD-10-CM | POA: Diagnosis not present

## 2021-02-27 DIAGNOSIS — B964 Proteus (mirabilis) (morganii) as the cause of diseases classified elsewhere: Secondary | ICD-10-CM | POA: Diagnosis not present

## 2021-02-27 DIAGNOSIS — R6889 Other general symptoms and signs: Secondary | ICD-10-CM | POA: Diagnosis not present

## 2021-02-27 DIAGNOSIS — B9689 Other specified bacterial agents as the cause of diseases classified elsewhere: Secondary | ICD-10-CM | POA: Diagnosis not present

## 2021-02-27 DIAGNOSIS — M5432 Sciatica, left side: Secondary | ICD-10-CM | POA: Diagnosis not present

## 2021-02-27 DIAGNOSIS — G8222 Paraplegia, incomplete: Secondary | ICD-10-CM | POA: Diagnosis not present

## 2021-02-27 DIAGNOSIS — I69359 Hemiplegia and hemiparesis following cerebral infarction affecting unspecified side: Secondary | ICD-10-CM | POA: Diagnosis not present

## 2021-02-28 DIAGNOSIS — Z743 Need for continuous supervision: Secondary | ICD-10-CM | POA: Diagnosis not present

## 2021-02-28 DIAGNOSIS — M2550 Pain in unspecified joint: Secondary | ICD-10-CM | POA: Diagnosis not present

## 2021-02-28 DIAGNOSIS — Z7401 Bed confinement status: Secondary | ICD-10-CM | POA: Diagnosis not present

## 2021-02-28 DIAGNOSIS — R6889 Other general symptoms and signs: Secondary | ICD-10-CM | POA: Diagnosis not present

## 2021-03-24 DIAGNOSIS — M7989 Other specified soft tissue disorders: Secondary | ICD-10-CM | POA: Diagnosis not present

## 2021-03-24 DIAGNOSIS — I10 Essential (primary) hypertension: Secondary | ICD-10-CM | POA: Diagnosis not present

## 2021-03-24 DIAGNOSIS — K219 Gastro-esophageal reflux disease without esophagitis: Secondary | ICD-10-CM | POA: Diagnosis not present

## 2021-03-24 DIAGNOSIS — G894 Chronic pain syndrome: Secondary | ICD-10-CM | POA: Diagnosis not present

## 2021-03-24 DIAGNOSIS — R238 Other skin changes: Secondary | ICD-10-CM | POA: Diagnosis not present

## 2021-05-05 DIAGNOSIS — T50904A Poisoning by unspecified drugs, medicaments and biological substances, undetermined, initial encounter: Secondary | ICD-10-CM | POA: Diagnosis not present

## 2021-05-05 DIAGNOSIS — T40601A Poisoning by unspecified narcotics, accidental (unintentional), initial encounter: Secondary | ICD-10-CM | POA: Diagnosis not present

## 2021-05-05 DIAGNOSIS — R2232 Localized swelling, mass and lump, left upper limb: Secondary | ICD-10-CM | POA: Diagnosis not present

## 2021-05-05 DIAGNOSIS — R404 Transient alteration of awareness: Secondary | ICD-10-CM | POA: Diagnosis not present

## 2021-05-05 DIAGNOSIS — R092 Respiratory arrest: Secondary | ICD-10-CM | POA: Diagnosis not present

## 2021-05-05 DIAGNOSIS — Z743 Need for continuous supervision: Secondary | ICD-10-CM | POA: Diagnosis not present

## 2021-05-05 DIAGNOSIS — T402X1A Poisoning by other opioids, accidental (unintentional), initial encounter: Secondary | ICD-10-CM | POA: Diagnosis not present

## 2021-05-05 DIAGNOSIS — R0902 Hypoxemia: Secondary | ICD-10-CM | POA: Diagnosis not present

## 2021-06-26 DIAGNOSIS — G894 Chronic pain syndrome: Secondary | ICD-10-CM | POA: Diagnosis not present

## 2021-06-26 DIAGNOSIS — G8929 Other chronic pain: Secondary | ICD-10-CM | POA: Diagnosis not present

## 2021-06-26 DIAGNOSIS — M62838 Other muscle spasm: Secondary | ICD-10-CM | POA: Diagnosis not present

## 2021-06-26 DIAGNOSIS — I10 Essential (primary) hypertension: Secondary | ICD-10-CM | POA: Diagnosis not present

## 2021-06-26 DIAGNOSIS — Z008 Encounter for other general examination: Secondary | ICD-10-CM | POA: Diagnosis not present

## 2021-06-26 DIAGNOSIS — M7989 Other specified soft tissue disorders: Secondary | ICD-10-CM | POA: Diagnosis not present

## 2021-06-26 DIAGNOSIS — Z993 Dependence on wheelchair: Secondary | ICD-10-CM | POA: Diagnosis not present

## 2021-06-26 DIAGNOSIS — R7303 Prediabetes: Secondary | ICD-10-CM | POA: Diagnosis not present

## 2021-07-27 DIAGNOSIS — M47816 Spondylosis without myelopathy or radiculopathy, lumbar region: Secondary | ICD-10-CM | POA: Diagnosis not present

## 2021-07-27 DIAGNOSIS — G894 Chronic pain syndrome: Secondary | ICD-10-CM | POA: Diagnosis not present

## 2021-07-29 DIAGNOSIS — G894 Chronic pain syndrome: Secondary | ICD-10-CM | POA: Diagnosis not present

## 2021-07-29 DIAGNOSIS — R600 Localized edema: Secondary | ICD-10-CM | POA: Diagnosis not present

## 2021-07-29 DIAGNOSIS — I10 Essential (primary) hypertension: Secondary | ICD-10-CM | POA: Diagnosis not present

## 2021-07-29 DIAGNOSIS — M7989 Other specified soft tissue disorders: Secondary | ICD-10-CM | POA: Diagnosis not present

## 2021-07-29 DIAGNOSIS — Z741 Need for assistance with personal care: Secondary | ICD-10-CM | POA: Diagnosis not present

## 2021-08-05 DIAGNOSIS — G8929 Other chronic pain: Secondary | ICD-10-CM | POA: Diagnosis not present

## 2021-08-05 DIAGNOSIS — R208 Other disturbances of skin sensation: Secondary | ICD-10-CM | POA: Diagnosis not present

## 2021-08-05 DIAGNOSIS — M62838 Other muscle spasm: Secondary | ICD-10-CM | POA: Diagnosis not present

## 2021-08-05 DIAGNOSIS — I10 Essential (primary) hypertension: Secondary | ICD-10-CM | POA: Diagnosis not present

## 2021-08-05 DIAGNOSIS — Z79899 Other long term (current) drug therapy: Secondary | ICD-10-CM | POA: Diagnosis not present

## 2021-08-05 DIAGNOSIS — G4700 Insomnia, unspecified: Secondary | ICD-10-CM | POA: Diagnosis not present

## 2021-08-05 DIAGNOSIS — F17211 Nicotine dependence, cigarettes, in remission: Secondary | ICD-10-CM | POA: Diagnosis not present

## 2022-07-19 ENCOUNTER — Encounter (HOSPITAL_COMMUNITY): Payer: Self-pay

## 2022-07-19 ENCOUNTER — Other Ambulatory Visit: Payer: Self-pay

## 2022-07-19 ENCOUNTER — Inpatient Hospital Stay (HOSPITAL_COMMUNITY)
Admission: EM | Admit: 2022-07-19 | Discharge: 2022-07-22 | DRG: 603 | Disposition: A | Discharge status: Home Health | Payer: Medicare Other | Attending: Internal Medicine | Admitting: Internal Medicine

## 2022-07-19 DIAGNOSIS — G8929 Other chronic pain: Secondary | ICD-10-CM | POA: Diagnosis present

## 2022-07-19 DIAGNOSIS — R1031 Right lower quadrant pain: Secondary | ICD-10-CM | POA: Diagnosis present

## 2022-07-19 DIAGNOSIS — Z791 Long term (current) use of non-steroidal anti-inflammatories (NSAID): Secondary | ICD-10-CM

## 2022-07-19 DIAGNOSIS — R109 Unspecified abdominal pain: Secondary | ICD-10-CM | POA: Diagnosis present

## 2022-07-19 DIAGNOSIS — L03113 Cellulitis of right upper limb: Principal | ICD-10-CM | POA: Diagnosis present

## 2022-07-19 DIAGNOSIS — Z79899 Other long term (current) drug therapy: Secondary | ICD-10-CM

## 2022-07-19 DIAGNOSIS — Z888 Allergy status to other drugs, medicaments and biological substances status: Secondary | ICD-10-CM

## 2022-07-19 DIAGNOSIS — G8220 Paraplegia, unspecified: Secondary | ICD-10-CM | POA: Diagnosis present

## 2022-07-19 DIAGNOSIS — I69898 Other sequelae of other cerebrovascular disease: Secondary | ICD-10-CM

## 2022-07-19 DIAGNOSIS — E785 Hyperlipidemia, unspecified: Secondary | ICD-10-CM | POA: Diagnosis present

## 2022-07-19 DIAGNOSIS — Z993 Dependence on wheelchair: Secondary | ICD-10-CM

## 2022-07-19 DIAGNOSIS — D638 Anemia in other chronic diseases classified elsewhere: Secondary | ICD-10-CM | POA: Diagnosis present

## 2022-07-19 DIAGNOSIS — Z8261 Family history of arthritis: Secondary | ICD-10-CM

## 2022-07-19 DIAGNOSIS — L02419 Cutaneous abscess of limb, unspecified: Principal | ICD-10-CM

## 2022-07-19 DIAGNOSIS — I10 Essential (primary) hypertension: Secondary | ICD-10-CM | POA: Diagnosis present

## 2022-07-19 DIAGNOSIS — F17210 Nicotine dependence, cigarettes, uncomplicated: Secondary | ICD-10-CM | POA: Diagnosis present

## 2022-07-19 DIAGNOSIS — Z833 Family history of diabetes mellitus: Secondary | ICD-10-CM

## 2022-07-19 DIAGNOSIS — R112 Nausea with vomiting, unspecified: Secondary | ICD-10-CM | POA: Diagnosis present

## 2022-07-19 DIAGNOSIS — M545 Low back pain, unspecified: Secondary | ICD-10-CM | POA: Diagnosis present

## 2022-07-19 DIAGNOSIS — Z8614 Personal history of Methicillin resistant Staphylococcus aureus infection: Secondary | ICD-10-CM

## 2022-07-19 DIAGNOSIS — Z8249 Family history of ischemic heart disease and other diseases of the circulatory system: Secondary | ICD-10-CM

## 2022-07-19 NOTE — ED Triage Notes (Signed)
PER EMS: pt is wheelchair bound, from home with c/o swelling to bilateral arms. He has a wound to his right arm that he sustained a week ago from hitting it on his door frame. Redness, swelling and circle open area to forearm with purulent drainage. He states he feels "sick."  BP- 152/82, HR-86, 93% RA, RR-22

## 2022-07-20 ENCOUNTER — Inpatient Hospital Stay (HOSPITAL_COMMUNITY): Payer: Medicare Other

## 2022-07-20 ENCOUNTER — Emergency Department (HOSPITAL_COMMUNITY): Payer: Medicare Other

## 2022-07-20 ENCOUNTER — Encounter (HOSPITAL_COMMUNITY): Payer: Self-pay | Admitting: Internal Medicine

## 2022-07-20 DIAGNOSIS — Z8261 Family history of arthritis: Secondary | ICD-10-CM | POA: Diagnosis not present

## 2022-07-20 DIAGNOSIS — L03113 Cellulitis of right upper limb: Secondary | ICD-10-CM | POA: Diagnosis present

## 2022-07-20 DIAGNOSIS — G8220 Paraplegia, unspecified: Secondary | ICD-10-CM | POA: Diagnosis present

## 2022-07-20 DIAGNOSIS — M545 Low back pain, unspecified: Secondary | ICD-10-CM | POA: Diagnosis present

## 2022-07-20 DIAGNOSIS — Z791 Long term (current) use of non-steroidal anti-inflammatories (NSAID): Secondary | ICD-10-CM | POA: Diagnosis not present

## 2022-07-20 DIAGNOSIS — Z8614 Personal history of Methicillin resistant Staphylococcus aureus infection: Secondary | ICD-10-CM | POA: Diagnosis not present

## 2022-07-20 DIAGNOSIS — D638 Anemia in other chronic diseases classified elsewhere: Secondary | ICD-10-CM | POA: Diagnosis present

## 2022-07-20 DIAGNOSIS — Z8249 Family history of ischemic heart disease and other diseases of the circulatory system: Secondary | ICD-10-CM | POA: Diagnosis not present

## 2022-07-20 DIAGNOSIS — Z833 Family history of diabetes mellitus: Secondary | ICD-10-CM | POA: Diagnosis not present

## 2022-07-20 DIAGNOSIS — R1031 Right lower quadrant pain: Secondary | ICD-10-CM | POA: Diagnosis present

## 2022-07-20 DIAGNOSIS — Z888 Allergy status to other drugs, medicaments and biological substances status: Secondary | ICD-10-CM | POA: Diagnosis not present

## 2022-07-20 DIAGNOSIS — R112 Nausea with vomiting, unspecified: Secondary | ICD-10-CM | POA: Diagnosis present

## 2022-07-20 DIAGNOSIS — E785 Hyperlipidemia, unspecified: Secondary | ICD-10-CM | POA: Diagnosis present

## 2022-07-20 DIAGNOSIS — I10 Essential (primary) hypertension: Secondary | ICD-10-CM | POA: Diagnosis present

## 2022-07-20 DIAGNOSIS — F17210 Nicotine dependence, cigarettes, uncomplicated: Secondary | ICD-10-CM | POA: Diagnosis present

## 2022-07-20 DIAGNOSIS — G8929 Other chronic pain: Secondary | ICD-10-CM | POA: Diagnosis present

## 2022-07-20 DIAGNOSIS — Z79899 Other long term (current) drug therapy: Secondary | ICD-10-CM | POA: Diagnosis not present

## 2022-07-20 DIAGNOSIS — R109 Unspecified abdominal pain: Secondary | ICD-10-CM | POA: Diagnosis present

## 2022-07-20 DIAGNOSIS — I69898 Other sequelae of other cerebrovascular disease: Secondary | ICD-10-CM | POA: Diagnosis not present

## 2022-07-20 DIAGNOSIS — Z993 Dependence on wheelchair: Secondary | ICD-10-CM | POA: Diagnosis not present

## 2022-07-20 LAB — COMPREHENSIVE METABOLIC PANEL
ALT: 12 U/L (ref 0–44)
AST: 13 U/L — ABNORMAL LOW (ref 15–41)
Albumin: 3.2 g/dL — ABNORMAL LOW (ref 3.5–5.0)
Alkaline Phosphatase: 74 U/L (ref 38–126)
Anion gap: 9 (ref 5–15)
BUN: 15 mg/dL (ref 6–20)
CO2: 26 mmol/L (ref 22–32)
Calcium: 9.1 mg/dL (ref 8.9–10.3)
Chloride: 104 mmol/L (ref 98–111)
Creatinine, Ser: 0.81 mg/dL (ref 0.61–1.24)
GFR, Estimated: >60 mL/min (ref >60)
Glucose, Bld: 97 mg/dL (ref 70–99)
Potassium: 3.9 mmol/L (ref 3.5–5.1)
Sodium: 139 mmol/L (ref 135–145)
Total Bilirubin: 0.5 mg/dL (ref 0.3–1.2)
Total Protein: 7.3 g/dL (ref 6.5–8.1)

## 2022-07-20 LAB — APTT: aPTT: 30 seconds (ref 24–36)

## 2022-07-20 LAB — CBC WITH DIFFERENTIAL/PLATELET
Abs Immature Granulocytes: 0.04 10*3/uL (ref 0.00–0.07)
Basophils Absolute: 0 10*3/uL (ref 0.0–0.1)
Basophils Relative: 0 %
Eosinophils Absolute: 0.3 10*3/uL (ref 0.0–0.5)
Eosinophils Relative: 3 %
HCT: 36.1 % — ABNORMAL LOW (ref 39.0–52.0)
Hemoglobin: 11.9 g/dL — ABNORMAL LOW (ref 13.0–17.0)
Immature Granulocytes: 0 %
Lymphocytes Relative: 20 %
Lymphs Abs: 2.4 10*3/uL (ref 0.7–4.0)
MCH: 29 pg (ref 26.0–34.0)
MCHC: 33 g/dL (ref 30.0–36.0)
MCV: 88 fL (ref 80.0–100.0)
Monocytes Absolute: 0.9 10*3/uL (ref 0.1–1.0)
Monocytes Relative: 7 %
Neutro Abs: 8.4 10*3/uL — ABNORMAL HIGH (ref 1.7–7.7)
Neutrophils Relative %: 70 %
Platelets: 293 10*3/uL (ref 150–400)
RBC: 4.1 MIL/uL — ABNORMAL LOW (ref 4.22–5.81)
RDW: 15.7 % — ABNORMAL HIGH (ref 11.5–15.5)
WBC: 12.1 10*3/uL — ABNORMAL HIGH (ref 4.0–10.5)
nRBC: 0 % (ref 0.0–0.2)

## 2022-07-20 LAB — URINALYSIS, ROUTINE W REFLEX MICROSCOPIC
Bilirubin Urine: NEGATIVE
Glucose, UA: NEGATIVE mg/dL
Ketones, ur: NEGATIVE mg/dL
Nitrite: NEGATIVE
Protein, ur: NEGATIVE mg/dL
Specific Gravity, Urine: 1.006 (ref 1.005–1.030)
pH: 7 (ref 5.0–8.0)

## 2022-07-20 LAB — HEMOGLOBIN A1C
Hgb A1c MFr Bld: 5.4 % (ref 4.8–5.6)
Mean Plasma Glucose: 108.28 mg/dL

## 2022-07-20 LAB — PROTIME-INR
INR: 1.1 (ref 0.8–1.2)
Prothrombin Time: 13.8 seconds (ref 11.4–15.2)

## 2022-07-20 LAB — LACTIC ACID, PLASMA: Lactic Acid, Venous: 0.8 mmol/L (ref 0.5–1.9)

## 2022-07-20 MED ORDER — TEMAZEPAM 15 MG PO CAPS: 15.0000 mg | ORAL_CAPSULE | Freq: Every evening | ORAL | Status: DC | PRN

## 2022-07-20 MED ORDER — CYCLOBENZAPRINE HCL 5 MG PO TABS: 7.5000 mg | ORAL_TABLET | Freq: Three times a day (TID) | ORAL | Status: DC | PRN

## 2022-07-20 MED ORDER — DULOXETINE HCL 30 MG PO CPEP
120.0000 mg | ORAL_CAPSULE | Freq: Every day | ORAL | Status: DC
  Administered 2022-07-20 – 2022-07-21 (×2): 120 mg via ORAL
  Filled 2022-07-20 (×2): qty 4

## 2022-07-20 MED ORDER — PHENOL 1.4 % MT LIQD
1.0000 | OROMUCOSAL | Status: DC | PRN
  Filled 2022-07-20: qty 177

## 2022-07-20 MED ORDER — FENTANYL CITRATE PF 50 MCG/ML IJ SOSY
50.0000 ug | PREFILLED_SYRINGE | Freq: Once | INTRAMUSCULAR | Status: AC
  Administered 2022-07-20: 50 ug via INTRAVENOUS
  Filled 2022-07-20: qty 1

## 2022-07-20 MED ORDER — POTASSIUM CHLORIDE CRYS ER 20 MEQ PO TBCR
20.0000 meq | EXTENDED_RELEASE_TABLET | Freq: Every day | ORAL | Status: DC
  Administered 2022-07-21: 20 meq via ORAL
  Filled 2022-07-20 (×3): qty 1

## 2022-07-20 MED ORDER — HYDROXYZINE HCL 25 MG PO TABS
25.0000 mg | ORAL_TABLET | Freq: Three times a day (TID) | ORAL | Status: DC
  Administered 2022-07-20 – 2022-07-21 (×5): 25 mg via ORAL
  Filled 2022-07-20 (×8): qty 1

## 2022-07-20 MED ORDER — HEPARIN SODIUM (PORCINE) 5000 UNIT/ML IJ SOLN
5000.0000 [IU] | Freq: Three times a day (TID) | INTRAMUSCULAR | Status: DC
  Administered 2022-07-20 – 2022-07-22 (×4): 5000 [IU] via SUBCUTANEOUS
  Filled 2022-07-20 (×5): qty 1

## 2022-07-20 MED ORDER — IOHEXOL 300 MG/ML  SOLN
100.0000 mL | Freq: Once | INTRAMUSCULAR | Status: AC | PRN
  Administered 2022-07-20: 100 mL via INTRAVENOUS

## 2022-07-20 MED ORDER — ISOSORBIDE MONONITRATE ER 60 MG PO TB24
60.0000 mg | ORAL_TABLET | Freq: Every day | ORAL | Status: DC
  Administered 2022-07-22: 60 mg via ORAL
  Filled 2022-07-20 (×3): qty 1

## 2022-07-20 MED ORDER — LORATADINE 10 MG PO TABS
10.0000 mg | ORAL_TABLET | Freq: Every day | ORAL | Status: DC
  Filled 2022-07-20 (×2): qty 1

## 2022-07-20 MED ORDER — LACTATED RINGERS IV SOLN: INTRAVENOUS | Status: AC

## 2022-07-20 MED ORDER — DOCUSATE SODIUM 100 MG PO CAPS
100.0000 mg | ORAL_CAPSULE | Freq: Two times a day (BID) | ORAL | Status: DC
  Administered 2022-07-21 – 2022-07-22 (×2): 100 mg via ORAL
  Filled 2022-07-20 (×5): qty 1

## 2022-07-20 MED ORDER — TAMSULOSIN HCL 0.4 MG PO CAPS
0.4000 mg | ORAL_CAPSULE | Freq: Every day | ORAL | Status: DC
  Filled 2022-07-20 (×3): qty 1

## 2022-07-20 MED ORDER — KETOROLAC TROMETHAMINE 30 MG/ML IJ SOLN
30.0000 mg | Freq: Four times a day (QID) | INTRAMUSCULAR | Status: DC
  Administered 2022-07-20 – 2022-07-22 (×8): 30 mg via INTRAVENOUS
  Filled 2022-07-20 (×8): qty 1

## 2022-07-20 MED ORDER — LOSARTAN POTASSIUM 25 MG PO TABS: 50.0000 mg | ORAL_TABLET | Freq: Every day | ORAL | Status: DC

## 2022-07-20 MED ORDER — PANTOPRAZOLE SODIUM 40 MG PO TBEC
40.0000 mg | DELAYED_RELEASE_TABLET | Freq: Every day | ORAL | Status: DC
  Filled 2022-07-20 (×2): qty 1

## 2022-07-20 MED ORDER — VENLAFAXINE HCL 37.5 MG PO TABS
37.5000 mg | ORAL_TABLET | Freq: Every day | ORAL | Status: DC
  Administered 2022-07-21: 37.5 mg via ORAL
  Filled 2022-07-20 (×3): qty 1

## 2022-07-20 MED ORDER — POLYETHYLENE GLYCOL 3350 17 G PO PACK
17.0000 g | PACK | Freq: Two times a day (BID) | ORAL | Status: DC
  Filled 2022-07-20: qty 1

## 2022-07-20 MED ORDER — TRAZODONE HCL 100 MG PO TABS
100.0000 mg | ORAL_TABLET | Freq: Every day | ORAL | Status: DC
  Administered 2022-07-20 – 2022-07-21 (×2): 100 mg via ORAL
  Filled 2022-07-20 (×2): qty 1

## 2022-07-20 MED ORDER — ATORVASTATIN CALCIUM 40 MG PO TABS
40.0000 mg | ORAL_TABLET | Freq: Every day | ORAL | Status: DC
  Filled 2022-07-20 (×2): qty 1

## 2022-07-20 MED ORDER — BACITRACIN ZINC 500 UNIT/GM EX OINT
TOPICAL_OINTMENT | CUTANEOUS | Status: AC
  Administered 2022-07-20: 1
  Filled 2022-07-20: qty 0.9

## 2022-07-20 MED ORDER — FENTANYL CITRATE PF 50 MCG/ML IJ SOSY
50.0000 ug | PREFILLED_SYRINGE | INTRAMUSCULAR | Status: DC | PRN
  Administered 2022-07-20 – 2022-07-22 (×11): 50 ug via INTRAVENOUS
  Filled 2022-07-20 (×11): qty 1

## 2022-07-20 MED ORDER — FUROSEMIDE 20 MG PO TABS
20.0000 mg | ORAL_TABLET | Freq: Every day | ORAL | Status: DC
  Administered 2022-07-20 – 2022-07-22 (×3): 20 mg via ORAL
  Filled 2022-07-20 (×3): qty 1

## 2022-07-20 MED ORDER — VANCOMYCIN HCL 2000 MG/400ML IV SOLN: 2000.0000 mg | INTRAVENOUS | Status: DC

## 2022-07-20 MED ORDER — LUBIPROSTONE 24 MCG PO CAPS
24.0000 ug | ORAL_CAPSULE | Freq: Every day | ORAL | Status: DC
  Filled 2022-07-20 (×3): qty 1

## 2022-07-20 MED ORDER — VANCOMYCIN HCL 1250 MG/250ML IV SOLN
1250.0000 mg | Freq: Two times a day (BID) | INTRAVENOUS | Status: DC
  Administered 2022-07-20 (×2): 1250 mg via INTRAVENOUS
  Filled 2022-07-20 (×3): qty 250

## 2022-07-20 MED ORDER — GABAPENTIN 400 MG PO CAPS
400.0000 mg | ORAL_CAPSULE | Freq: Three times a day (TID) | ORAL | Status: DC
  Administered 2022-07-20 – 2022-07-22 (×7): 400 mg via ORAL
  Filled 2022-07-20 (×7): qty 1

## 2022-07-20 MED ORDER — SALINE SPRAY 0.65 % NA SOLN: 1.0000 | NASAL | Status: DC | PRN

## 2022-07-20 MED ORDER — SODIUM CHLORIDE (PF) 0.9 % IJ SOLN
INTRAMUSCULAR | Status: AC
  Filled 2022-07-20: qty 100

## 2022-07-20 MED ORDER — METOPROLOL TARTRATE 25 MG PO TABS
25.0000 mg | ORAL_TABLET | Freq: Every day | ORAL | Status: DC
  Administered 2022-07-20 – 2022-07-21 (×2): 25 mg via ORAL
  Filled 2022-07-20 (×3): qty 1

## 2022-07-20 MED ORDER — AMLODIPINE BESYLATE 5 MG PO TABS
5.0000 mg | ORAL_TABLET | Freq: Every day | ORAL | Status: DC
  Filled 2022-07-20 (×3): qty 1

## 2022-07-20 MED ORDER — VANCOMYCIN HCL 2000 MG/400ML IV SOLN
2000.0000 mg | Freq: Once | INTRAVENOUS | Status: AC
  Administered 2022-07-20: 2000 mg via INTRAVENOUS
  Filled 2022-07-20: qty 400

## 2022-07-20 MED ORDER — HEPARIN SODIUM (PORCINE) 5000 UNIT/ML IJ SOLN: 5000.0000 [IU] | Freq: Three times a day (TID) | INTRAMUSCULAR | Status: DC

## 2022-07-20 MED ORDER — PIPERACILLIN-TAZOBACTAM 3.375 G IVPB
3.3750 g | Freq: Three times a day (TID) | INTRAVENOUS | Status: DC
  Administered 2022-07-20 – 2022-07-21 (×4): 3.375 g via INTRAVENOUS
  Filled 2022-07-20 (×5): qty 50

## 2022-07-20 NOTE — Progress Notes (Signed)
A consult was received from an ED physician for Vancomycin per pharmacy dosing.  The patient's profile has been reviewed for ht/wt/allergies/indication/available labs.    A one time order has been placed for Vancomycin 2gm IV.    Further antibiotics/pharmacy consults should be ordered by admitting physician if indicated.                       Thank you, Everette Rank, PharmD 07/20/2022  12:34 AM

## 2022-07-20 NOTE — Assessment & Plan Note (Signed)
Patient on multiple medications. BP low stable at admission.  Plan Continue home meds.

## 2022-07-20 NOTE — Consult Note (Addendum)
Reason for Consult:FA infection Referring Physician: Legrand Como Norrins Time called: 3532 Time at bedside: 1107   Ronnie Moore is an 61 y.o. male.  HPI: Ronnie Moore comes in with a several week hx/o bilateral FA pain. About 3d ago he had a lesion appear on his right FA that started draining pus. He relates all of this to a stroke that he suffered around the same time this started. He is RHD. He denies fevers, chills, sweats but has had N/V.  Past Medical History:  Diagnosis Date   Cancer ([place])    Chronic back pain    Hypertension    Stroke [facility])     Past Surgical History:  Procedure Laterality Date   BACK SURGERY     I & D EXTREMITY Right 06/07/2016   Procedure: IRRIGATION AND DEBRIDEMENT WRIST;  Surgeon: Dayna Barker, MD;  Location: [place];  Service: Plastics;  Laterality: Right;   KNEE SURGERY      Family History  Problem Relation Age of Onset   Arthritis Mother    Hypertension Father    Heart disease Father    Diabetes Mellitus II Sister     Social History:  reports that he has been smoking. He has been smoking an average of .5 packs per day. He has never used smokeless tobacco. He reports that he does not currently use alcohol. He reports that he does not use drugs.  Allergies:  Allergies  Allergen Reactions   Skelaxin [Metaxalone] Swelling    Lips swelled     Medications: I have reviewed the patient's current medications.  Results for orders placed or performed during the hospital encounter of 07/19/22 (from the past 48 hour(s))  Urinalysis, Routine w reflex microscopic     Status: Abnormal   Collection Time: 07/20/22 12:12 AM  Result Value Ref Range   Color, Urine YELLOW YELLOW   APPearance HAZY (A) CLEAR   Specific Gravity, Urine 1.006 1.005 - 1.030   pH 7.0 5.0 - 8.0   Glucose, UA NEGATIVE NEGATIVE mg/dL   Hgb urine dipstick SMALL (A) NEGATIVE   Bilirubin Urine NEGATIVE NEGATIVE   Ketones, ur NEGATIVE NEGATIVE mg/dL   Protein, ur NEGATIVE NEGATIVE mg/dL    Nitrite NEGATIVE NEGATIVE   Leukocytes,Ua LARGE (A) NEGATIVE   RBC / HPF 0-5 0 - 5 RBC/hpf   WBC, UA 21-50 0 - 5 WBC/hpf   Bacteria, UA RARE (A) NONE SEEN   Mucus PRESENT     Comment: Performed at [facility], [place] [address] [place], [place] [address]  Lactic acid, plasma     Status: None   Collection Time: 07/20/22 12:33 AM  Result Value Ref Range   Lactic Acid, Venous 0.8 0.5 - 1.9 mmol/L    Comment: Performed at [facility], [place] [address], [place], [place] [address]  Comprehensive metabolic panel     Status: Abnormal   Collection Time: 07/20/22 12:33 AM  Result Value Ref Range   Sodium 139 135 - 145 mmol/L   Potassium 3.9 3.5 - 5.1 mmol/L   Chloride 104 98 - 111 mmol/L   CO2 26 22 - 32 mmol/L   Glucose, Bld 97 70 - 99 mg/dL    Comment: Glucose reference range applies only to samples taken after fasting for at least 8 hours.   BUN 15 6 - 20 mg/dL   Creatinine, Ser 0.81 0.61 - 1.24 mg/dL   Calcium 9.1 8.9 - 10.3 mg/dL   Total Protein 7.3 6.5 -  8.1 g/dL   Albumin 3.2 (L) 3.5 - 5.0 g/dL   AST 13 (L) 15 - 41 U/L   ALT 12 0 - 44 U/L   Alkaline Phosphatase 74 38 - 126 U/L   Total Bilirubin 0.5 0.3 - 1.2 mg/dL   GFR, Estimated >60 >60 mL/min    Comment: (NOTE) Calculated using the CKD-EPI Creatinine Equation (2021)    Anion gap 9 5 - 15    Comment: Performed at [facility], [place] [address], [place], [place] [address]  CBC with Differential     Status: Abnormal   Collection Time: 07/20/22 12:33 AM  Result Value Ref Range   WBC 12.1 (H) 4.0 - 10.5 K/uL   RBC 4.10 (L) 4.22 - 5.81 MIL/uL   Hemoglobin 11.9 (L) 13.0 - 17.0 g/dL   HCT 36.1 (L) 39.0 - 52.0 %   MCV 88.0 80.0 - 100.0 fL   MCH 29.0 26.0 - 34.0 pg   MCHC 33.0 30.0 - 36.0 g/dL   RDW 15.7 (H) 11.5 - 15.5 %   Platelets 293 150 - 400 K/uL   nRBC 0.0 0.0 - 0.2 %   Neutrophils Relative % 70 %   Neutro Abs 8.4 (H) 1.7 - 7.7 K/uL   Lymphocytes Relative 20 %    Lymphs Abs 2.4 0.7 - 4.0 K/uL   Monocytes Relative 7 %   Monocytes Absolute 0.9 0.1 - 1.0 K/uL   Eosinophils Relative 3 %   Eosinophils Absolute 0.3 0.0 - 0.5 K/uL   Basophils Relative 0 %   Basophils Absolute 0.0 0.0 - 0.1 K/uL   Immature Granulocytes 0 %   Abs Immature Granulocytes 0.04 0.00 - 0.07 K/uL    Comment: Performed at [facility], [place] [address] [place], [place] [address]  Protime-INR     Status: None   Collection Time: 07/20/22 12:33 AM  Result Value Ref Range   Prothrombin Time 13.8 11.4 - 15.2 seconds   INR 1.1 0.8 - 1.2    Comment: (NOTE) INR goal varies based on device and disease states. Performed at [facility], [place] [address], [place], [place] [address]   APTT     Status: None   Collection Time: 07/20/22 12:33 AM  Result Value Ref Range   aPTT 30 24 - 36 seconds    Comment: Performed at [facility], [place] [address], [place], [place] [address]  Hemoglobin A1c     Status: None   Collection Time: 07/20/22 12:33 AM  Result Value Ref Range   Hgb A1c MFr Bld 5.4 4.8 - 5.6 %    Comment: (NOTE) Pre diabetes:          5.7%-6.4%  Diabetes:              >6.4%  Glycemic control for   <7.0% adults with diabetes    Mean Plasma Glucose 108.28 mg/dL    Comment: Performed at [facility] [address], [place], [place] [address]    CT ABDOMEN PELVIS W CONTRAST  Result Date: 07/20/2022 CLINICAL DATA:  Right lower quadrant pain with nausea and vomiting. Back pain. EXAM: CT ABDOMEN AND PELVIS WITH CONTRAST TECHNIQUE: Multidetector CT imaging of the abdomen and pelvis was performed using the standard protocol following bolus administration of intravenous contrast. RADIATION DOSE REDUCTION: This exam was performed according to the departmental dose-optimization program which includes automated exposure control, adjustment of the mA and/or kV according to patient size and/or  use of iterative reconstruction  technique. CONTRAST:  170m OMNIPAQUE IOHEXOL 300 MG/ML  SOLN COMPARISON:  CT abdomen/pelvis 11/16/2019, 02/27/2021 FINDINGS: Lower chest: The imaged lung bases are clear. Coronary artery calcifications are noted. The imaged heart is otherwise unremarkable. Hepatobiliary: The liver and gallbladder are unremarkable. There is no biliary ductal dilatation. Pancreas: Unremarkable. Spleen: Unremarkable. Adrenals/Urinary Tract: The adrenals are unremarkable. A hypodense lesion in the right kidney upper pole likely reflects a benign cyst for which no specific imaging follow-up is required. There are no other focal lesions. There are no stones. There is no hydronephrosis or hydroureter. There is symmetric excretion of contrast into the collecting systems on the delayed images. The bladder wall is mildly thickened with a few small right-sided diverticula raising suspicion for chronic outlet obstruction. Stomach/Bowel: The stomach is unremarkable. There is no evidence of bowel obstruction. There is no abnormal bowel wall thickening or inflammatory change. There is a moderate stool burden throughout the colon. The appendix is normal. Vascular/Lymphatic: There is calcified atherosclerotic plaque in the nonaneurysmal abdominal aorta. The major branch vessels appear patent. The main portal and splenic veins appear patent. There are prominent left iliac chain lymph nodes measuring up to 10 mm, unchanged since at least 2017. Mildly prominent inguinal lymph nodes are not significantly changed. There is no new or progressive lymphadenopathy in the abdomen or pelvis. Reproductive: The prostate and seminal vesicles are unremarkable. Other: There is no ascites or free air. Musculoskeletal: There is no acute osseous abnormality or suspicious osseous lesion. There are flowing osteophytes throughout the imaged thoracic spine extending into the lumbar spine consistent with diffuse idiopathic skeletal hyperostosis. There is advanced facet  arthropathy throughout the lumbar spine with likely multilevel spinal canal stenosis. IMPRESSION: 1. No acute finding in the abdomen or pelvis. 2. Moderate stool burden throughout the colon. 3. Bladder wall thickening and small diverticula raising suspicion for chronic outlet obstruction or neurogenic bladder. 4. Advanced multilevel degenerative change throughout the imaged spine with multilevel advanced facet arthropathy and likely multilevel spinal canal stenosis. Electronically Signed   By: PValetta MoleM.D.   On: 07/20/2022 09:50   DG Forearm Right  Result Date: 07/20/2022 CLINICAL DATA:  Recent blunt trauma 1 week ago with pain and swelling, initial encounter EXAM: RIGHT FOREARM - 2 VIEW COMPARISON:  None Available. FINDINGS: No acute fracture or dislocation is noted. Olecranon spurring is seen. Mild soft tissue swelling is noted consistent with the recent injury. IMPRESSION: Soft tissue swelling without acute bony abnormality. Electronically Signed   By: MInez CatalinaM.D.   On: 07/20/2022 01:25   DG Forearm Left  Result Date: 07/20/2022 CLINICAL DATA:  Bilateral arm swelling, no known injury, initial encounter EXAM: LEFT FOREARM - 2 VIEW COMPARISON:  None Available. FINDINGS: No acute fracture or dislocation is noted. No soft tissue abnormality is seen. IMPRESSION: No acute abnormality noted. Electronically Signed   By: MInez CatalinaM.D.   On: 07/20/2022 01:25   DG Chest Port 1 View  Result Date: 07/20/2022 CLINICAL DATA:  Possible sepsis EXAM: PORTABLE CHEST 1 VIEW COMPARISON:  11/21/2021 FINDINGS: Cardiac shadow is within normal limits. Lungs are well aerated bilaterally. Mild atelectatic changes are noted in the medial right lung base. No other focal abnormality is seen. IMPRESSION: Mild right basilar atelectasis. Electronically Signed   By: MInez CatalinaM.D.   On: 07/20/2022 01:24    Review of Systems  Constitutional:  Negative for chills, diaphoresis and fever.  HENT:  Negative for ear  discharge,  ear pain, hearing loss and tinnitus.   Eyes:  Negative for photophobia and pain.  Respiratory:  Negative for cough and shortness of breath.   Cardiovascular:  Negative for chest pain.  Gastrointestinal:  Negative for abdominal pain, nausea and vomiting.  Genitourinary:  Negative for dysuria, flank pain, frequency and urgency.  Musculoskeletal:  Positive for arthralgias (Bilateral FA's). Negative for back pain, myalgias and neck pain.  Neurological:  Negative for dizziness and headaches.  Hematological:  Does not bruise/bleed easily.  Psychiatric/Behavioral:  The patient is not nervous/anxious.    Blood pressure 125/68, pulse 77, temperature 98.5 F (36.9 C), temperature source Oral, resp. rate 15, weight 117.9 kg, SpO2 96 %. Physical Exam Constitutional:      General: He is not in acute distress.    Appearance: He is well-developed. He is not diaphoretic.  HENT:     Head: Normocephalic and atraumatic.  Eyes:     General: No scleral icterus.       Right eye: No discharge.        Left eye: No discharge.     Conjunctiva/sclera: Conjunctivae normal.  Cardiovascular:     Rate and Rhythm: Normal rate and regular rhythm.  Pulmonary:     Effort: Pulmonary effort is normal. No respiratory distress.  Musculoskeletal:     Cervical back: Normal range of motion.     Comments: Right shoulder, elbow, wrist, digits- Ulceration volar FA with malodorous discharge, mod TTP prox FA, no instability, no blocks to motion  Sens  Ax/R/M/U parestheic  Mot   Ax/ R/ PIN/ M/ AIN/ U intact  Rad 2+  Left shoulder, elbow, wrist, digits- no skin wounds, mild TTP diffuse FA, no fluctuance, no instability, no blocks to motion  Sens  Ax/R/M/U paresthetic  Mot   Ax/ R/ PIN/ M/ AIN/ U intact  Rad 2+  Skin:    General: Skin is warm and dry.  Neurological:     Mental Status: He is alert.  Psychiatric:        Mood and Affect: Mood normal.        Behavior: Behavior normal.      Assessment/Plan: Left FA -- Possibly some cellulitis but no e/o deeper fluid collection. Would treat medically with IV abx. Right FA -- No drainable fluid collection. Continue with IV abx treatment. Will give diet.    Lisette Abu, PA-C Orthopedic Surgery [phone] 07/20/2022, 11:38 AM   Addendum: Pt seen and examined.  Pt complains of difficult-to-describe sensations in bilateral arms.  He describes some pain at dorsal aspect of right arm that started around 4 or 5 days ago.  He notes that he has limited sensation in his arms and frequently bumps into things.  He thinks he bumped into to something with his right arm.  He also describes a numb feeling in both arms that has been present since his stroke in 2015.  He denies any pain in his left arm but refers to this long standing uncomfortable numb sensation.   Pt with an approx 2.5 x 2.5 cm wound at dorsum of right forearm with fibrinous material and scant purulence in wound bed.  Wound probed with a q-tip without significant undermining.  There is some exposed muscle with old appearing hematoma.  Compartments are soft and compressible.  No pain w/ PROM of wrist or fingers.  Limited sensation in BUE at baseline. There are no wounds around the left arm with some diffuse areas of skin thickening or induration.  There  is no erythema.  There are no fluctuant areas palpable.  Reviewed his bilateral CT scans which do not demonstrate any discrete, drainable fluid collection.  Recommend continued IV antibiotics.  Please keep pt NPO at midnight for possible surgical intervention tomorrow pending appearance of the right arm and open wounds.   Sherilyn Cooter, M.D. [place]

## 2022-07-20 NOTE — Subjective & Objective (Signed)
60 year old male with past medical history of paraplegia secondary to spinal cord stroke, hypertension, chronic low back pain, previous MRSA soft tissue infections in the past presenting with bilateral arm swelling / red for the past several days.  Worst on the right forearm.  Multiple areas of abscesses?  Secondary problem - acute abdominal pain, worse at RLQ along with N/V

## 2022-07-20 NOTE — Assessment & Plan Note (Signed)
Patient reports 2-3 days of worsening abdominal pain, worse at RLQ. He endorses N/V, poor tolerance food and fluids.   Plan CT abd/pelvis  Zosyn q8 while diagnostic w/u in progress.

## 2022-07-20 NOTE — ED Notes (Signed)
Pt in pain.  Pain assessment completed

## 2022-07-20 NOTE — Assessment & Plan Note (Signed)
Patient with h/o MRSA cellulitis in the past. Now with infection right forearm with 1.5 cm open wound proximally with purulent drainage, swelling and tenderness  Plan Vancomycin and Zosyn coverage  Warm soaks TID  Dry dressing with bactroban  Surgical consult for possible I&D

## 2022-07-20 NOTE — ED Notes (Signed)
Assisted pt to wash face, and provided pt with (2) more blankets.

## 2022-07-20 NOTE — Progress Notes (Signed)
Pharmacy Antibiotic Note  Ronnie Moore is a 61 y.o. male admitted on 07/19/2022 with Right forearm cellulitis with open wound, purulent drainage, swelling and tenderness.  H/o MRSA cellulitis. Pharmacy has been consulted for Zosyn and Vancomycin dosing.  Plan: Zosyn 3.375g IV Q8H infused over 4hrs. Vancomycin 2g then 1250 mg IV q12h (SCr 0.81, Vd 0.5, est AUC 509) Measure Vanc levels as needed.  Goal AUC = 400 - 550. Follow up renal function, culture results, and clinical course.   Weight: 117.9 kg (260 lb)  Temp (24hrs), Avg:98.4 F (36.9 C), Min:98.1 F (36.7 C), Max:98.7 F (37.1 C)  Recent Labs  Lab 07/20/22 0033  WBC 12.1*  CREATININE 0.81  LATICACIDVEN 0.8    CrCl cannot be calculated (Unknown ideal weight.).    Allergies  Allergen Reactions   Metaxalone Swelling    Reaction to Skelaxin - lips swelled    Antimicrobials this admission: 8/15 Vancomycin >>  8/15 Zosyn >>   Dose adjustments this admission:   Microbiology results: 8/15 BCx:   Thank you for allowing pharmacy to be a part of this patient's care.  Gretta Arab PharmD, BCPS Clinical Pharmacist [facility] main pharmacy [phone] 07/20/2022 8:12 AM

## 2022-07-20 NOTE — ED Provider Notes (Signed)
[facility] DEPT Provider Note   CSN: [id number] Arrival date & time: 07/19/22  2202     History  Chief Complaint  Patient presents with   Wound Check    Ronnie Moore is a 61 y.o. male who presents emergency department chief complaint of bilateral arm pain and generalized fatigue.  He has a past medical history of being wheelchair-bound.  He reports pain discharge and swelling from his right  arm x3 days and reports a history of recurrent skin infections.  Review of EMR shows the patient has a positive history for MRSA.  He complains of generalized body aches, fatigue, shaking chills.   Wound Check       Home Medications Prior to Admission medications   Medication Sig Start Date End Date Taking? Authorizing Provider  amLODipine (NORVASC) 5 MG tablet Take 5 mg by mouth daily. 10/29/19   [provider]  atorvastatin (LIPITOR) 40 MG tablet Take 40 mg by mouth at bedtime. 06/07/22   [provider]  cyclobenzaprine (FEXMID) 7.5 MG tablet Take 1 tablet (7.5 mg total) by mouth 3 (three) times daily as needed for muscle spasms. 11/28/19   Thornell Mule, MD  docusate sodium (COLACE) 50 MG capsule Take 1 capsule (50 mg total) by mouth 2 (two) times daily. 06/09/16   Reyne Dumas, MD  DULoxetine (CYMBALTA) 60 MG capsule Take 120 mg by mouth at bedtime. 10/29/19   [provider]  furosemide (LASIX) 20 MG tablet Take 20 mg by mouth daily. 06/07/22   [provider]  gabapentin (NEURONTIN) 400 MG capsule Take 1 capsule (400 mg total) by mouth 3 (three) times daily. 11/28/19   Thornell Mule, MD  hydrochlorothiazide (HYDRODIURIL) 25 MG tablet Take 25 mg by mouth daily. 10/29/19   [provider]  hydrOXYzine (ATARAX) 25 MG tablet Take 25 mg by mouth 3 (three) times daily. 05/26/22   [provider]  isosorbide mononitrate (IMDUR) 60 MG 24 hr tablet Take 60 mg by mouth daily. 06/07/22   [provider]   loratadine (CLARITIN) 10 MG tablet Take 1 tablet (10 mg total) by mouth daily. 11/29/19   Thornell Mule, MD  LORazepam (ATIVAN) 2 MG tablet Take 1 tablet (2 mg total) by mouth 3 (three) times daily. Patient not taking: Reported on 06/06/2016 08/30/12   Leonard Schwartz, MD  losartan (COZAAR) 100 MG tablet Take 0.5 tablets (50 mg total) by mouth daily. 11/28/19   Thornell Mule, MD  lubiprostone (AMITIZA) 24 MCG capsule Take 24 mcg by mouth daily. 06/07/22   [provider]  meloxicam (MOBIC) 15 MG tablet Take 1 tablet (15 mg total) by mouth daily. Patient not taking: Reported on 06/06/2016 08/30/12   Leonard Schwartz, MD  metoprolol tartrate (LOPRESSOR) 25 MG tablet Take 25 mg by mouth daily. 06/07/22   [provider]  nicotine (NICODERM CQ - DOSED IN MG/24 HOURS) 14 mg/24hr patch Place 1 patch (14 mg total) onto the skin daily. 11/29/19   Thornell Mule, MD  Olmesartan-Amlodipine-HCTZ [facility]) 40-10-25 MG TABS Take 1 tablet by mouth daily.    [provider]  pantoprazole (PROTONIX) 40 MG tablet Take 1 tablet (40 mg total) by mouth daily. 06/09/16   Reyne Dumas, MD  polyethylene glycol (MIRALAX / GLYCOLAX) packet Take 17 g by mouth 2 (two) times daily. 06/09/16   Reyne Dumas, MD  potassium chloride 20 MEQ/15ML (10%) SOLN Take 30 mLs (40 mEq total) by mouth daily. 06/09/16   Reyne Dumas, MD  potassium chloride SA (KLOR-CON M) 20 MEQ tablet Take 20 mEq by mouth daily. 06/07/22   [provider]  sodium chloride (OCEAN) 0.65 % SOLN nasal spray Place 1 spray into both nostrils as needed for congestion. 11/28/19   Thornell Mule, MD  tamsulosin (FLOMAX) 0.4 MG CAPS capsule Take 0.4 mg by mouth daily. 06/07/22   [provider]  temazepam (RESTORIL) 15 MG capsule Take 1 capsule (15 mg total) by mouth at bedtime as needed for sleep. 06/09/16   Reyne Dumas, MD  traZODone (DESYREL) 100 MG tablet Take 100 mg by mouth at bedtime. 06/07/22   [provider]  TRULICITY  1.5 FB/5.1WC SOPN Inject 1.5 mg into the skin once a week. 04/12/22   [provider]  venlafaxine (EFFEXOR) 37.5 MG tablet Take 37.5 mg by mouth daily. 06/07/22   [provider]      Allergies    Metaxalone    Review of Systems   Review of Systems  Physical Exam Updated Vital Signs BP 132/72   Pulse 73   Temp 98.7 F (37.1 C) (Oral)   Resp (!) 21   Wt 117.9 kg   SpO2 96%   BMI 39.53 kg/m  Physical Exam Vitals and nursing note reviewed.  Constitutional:      General: He is not in acute distress.    Appearance: He is well-developed. He is ill-appearing. He is not toxic-appearing or diaphoretic.  HENT:     Head: Normocephalic and atraumatic.  Eyes:     General: No scleral icterus.    Conjunctiva/sclera: Conjunctivae normal.  Cardiovascular:     Rate and Rhythm: Normal rate and regular rhythm.     Heart sounds: Normal heart sounds.  Pulmonary:     Effort: Pulmonary effort is normal. No respiratory distress.     Breath sounds: Normal breath sounds.  Abdominal:     Palpations: Abdomen is soft.     Tenderness: There is no abdominal tenderness.  Musculoskeletal:     Cervical back: Normal range of motion and neck supple.     Comments: BL forearm tenderness R>L R arm with swelling, ulcerations and malodorous discharge. L forearm with multiple tender nodules.  NVI  Normal BL shoulder, elbow and wrist exam  Skin:    General: Skin is warm and dry.  Neurological:     Mental Status: He is alert.  Psychiatric:        Behavior: Behavior normal.     ED Results / Procedures / Treatments   Labs (all labs ordered are listed, but only abnormal results are displayed) Labs Reviewed - No data to display  EKG None  Radiology No results found.  Procedures Procedures    Medications Ordered in ED Medications - No data to display  ED Course/ Medical Decision Making/ A&P Clinical Course as of 07/20/22 1708  Tue Jul 20, 2022  0142 WBC(!): 12.1 [AH]  0216  WBC(!): 12.1 [AH]  0216 Urinalysis, Routine w reflex microscopic(!) [AH]    Clinical Course User Index [AH] Margarita Mail, PA-C                           Medical Decision Making 61 y/o male with forearm infection. Supect cellulitis and abscess. Doubt nec fasc, osteomyelitis. Compartments are soft and no signs compartment syndrome. Labs : elevated wbs- lactate wnl ? UTI- but asxs I visualized and interpreted plain films of both arms- no ostoe or sub q gas- agree  with radilogy  Patient admitted to Hospitalist- will need hand consult.    Amount and/or Complexity of Data Reviewed Labs: ordered. Decision-making details documented in ED Course. Radiology: ordered and independent interpretation performed. ECG/medicine tests: ordered.  Risk Prescription drug management. Decision regarding hospitalization.           Final Clinical Impression(s) / ED Diagnoses Final diagnoses:  None    Rx / DC Orders ED Discharge Orders     None         Margarita Mail, PA-C 07/20/22 [place], [place], DO 07/28/22 [phone]

## 2022-07-20 NOTE — H&P (Signed)
History and Physical    Ronnie SCHAPPELL PJK:[id number] DOB: 01/22/1961 DOA: 07/19/2022  DOS: the patient was seen and examined on 07/19/2022  PCP: Patient, No Pcp Per   Patient coming from: Home  I have personally briefly reviewed patient's old medical records in [place]  61 year old male with past medical history of paraplegia secondary to spinal cord stroke, hypertension, chronic low back pain, previous MRSA soft tissue infections in the past presenting with bilateral arm swelling / red for the past several days.  Worst on the right forearm.  Multiple areas of abscesses?  Secondary problem - acute abdominal pain, worse at RLQ along with N/V   ED Course: T 98.4  108/76  77  16  Patient resting quietly, complaining of abdominal and arm pain. Lab WBC 12.1 w/ nl Diff, U/A LE  large, rare bacteria, 21-50 WBC/hpf, X-ray both forearms w/o fracture or evidence of osteo. TRH called to admit for management of infection.  Review of Systems:  Review of Systems  Constitutional:  Negative for chills and fever.  HENT: Negative.    Eyes: Negative.   Respiratory: Negative.    Cardiovascular: Negative.   Gastrointestinal:  Positive for abdominal pain, nausea and vomiting.       C/o generalized abdominal pain worse on the right  Genitourinary: Negative.   Musculoskeletal:  Positive for back pain.  Skin:        Swollen arms, R>L, visible wound from trauma with purulent drainage.  Neurological:  Positive for focal weakness.  Endo/Heme/Allergies: Negative.   Psychiatric/Behavioral:  Positive for depression. The patient is nervous/anxious.     Past Medical History:  Diagnosis Date   Cancer ([place])    Chronic back pain    Hypertension    Stroke [facility])     Past Surgical History:  Procedure Laterality Date   BACK SURGERY     I & D EXTREMITY Right 06/07/2016   Procedure: IRRIGATION AND DEBRIDEMENT WRIST;  Surgeon: Dayna Barker, MD;  Location: [facility];  Service: Plastics;  Laterality: Right;    KNEE SURGERY     Soc Hx - lives alone. Has intermittent help and home health. 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 Was married remotely and has one child.    reports that he has been smoking. He has been smoking an average of .5 packs per day. He has never used smokeless tobacco. He reports that he does not currently use alcohol. He reports that he does not use drugs.  Allergies  Allergen Reactions   Metaxalone Swelling    Reaction to Skelaxin - lips swelled    Family History  Problem Relation Age of Onset   Arthritis Mother    Hypertension Father    Heart disease Father    Diabetes Mellitus II Sister     Prior to Admission medications   Medication Sig Start Date End Date Taking? Authorizing Provider  amLODipine (NORVASC) 5 MG tablet Take 5 mg by mouth daily. 10/29/19   [provider]  atorvastatin (LIPITOR) 40 MG tablet Take 40 mg by mouth at bedtime. 06/07/22   [provider]  cyclobenzaprine (FEXMID) 7.5 MG tablet Take 1 tablet (7.5 mg total) by mouth 3 (three) times daily as needed for muscle spasms. 11/28/19   Thornell Mule, MD  docusate sodium (COLACE) 50 MG capsule Take 1 capsule (50 mg total) by mouth 2 (two) times daily. 06/09/16   Reyne Dumas, MD  DULoxetine (CYMBALTA) 60 MG capsule Take 120 mg by mouth at bedtime.  10/29/19   [provider]  furosemide (LASIX) 20 MG tablet Take 20 mg by mouth daily. 06/07/22   [provider]  gabapentin (NEURONTIN) 400 MG capsule Take 1 capsule (400 mg total) by mouth 3 (three) times daily. 11/28/19   Thornell Mule, MD  hydrochlorothiazide (HYDRODIURIL) 25 MG tablet Take 25 mg by mouth daily. 10/29/19   [provider]  hydrOXYzine (ATARAX) 25 MG tablet Take 25 mg by mouth 3 (three) times daily. 05/26/22   [provider]  isosorbide mononitrate (IMDUR) 60 MG 24 hr tablet Take 60 mg by mouth daily. 06/07/22   [provider]  loratadine (CLARITIN) 10 MG tablet Take 1 tablet (10 mg total)  by mouth daily. 11/29/19   Thornell Mule, MD  LORazepam (ATIVAN) 2 MG tablet Take 1 tablet (2 mg total) by mouth 3 (three) times daily. Patient not taking: Reported on 06/06/2016 08/30/12   Leonard Schwartz, MD  losartan (COZAAR) 100 MG tablet Take 0.5 tablets (50 mg total) by mouth daily. 11/28/19   Thornell Mule, MD  lubiprostone (AMITIZA) 24 MCG capsule Take 24 mcg by mouth daily. 06/07/22   [provider]  meloxicam (MOBIC) 15 MG tablet Take 1 tablet (15 mg total) by mouth daily. Patient not taking: Reported on 06/06/2016 08/30/12   Leonard Schwartz, MD  metoprolol tartrate (LOPRESSOR) 25 MG tablet Take 25 mg by mouth daily. 06/07/22   [provider]  nicotine (NICODERM CQ - DOSED IN MG/24 HOURS) 14 mg/24hr patch Place 1 patch (14 mg total) onto the skin daily. 11/29/19   Thornell Mule, MD  Olmesartan-Amlodipine-HCTZ [facility]) 40-10-25 MG TABS Take 1 tablet by mouth daily.    [provider]  pantoprazole (PROTONIX) 40 MG tablet Take 1 tablet (40 mg total) by mouth daily. 06/09/16   Reyne Dumas, MD  polyethylene glycol (MIRALAX / GLYCOLAX) packet Take 17 g by mouth 2 (two) times daily. 06/09/16   Reyne Dumas, MD  potassium chloride 20 MEQ/15ML (10%) SOLN Take 30 mLs (40 mEq total) by mouth daily. 06/09/16   Reyne Dumas, MD  potassium chloride SA (KLOR-CON M) 20 MEQ tablet Take 20 mEq by mouth daily. 06/07/22   [provider]  sodium chloride (OCEAN) 0.65 % SOLN nasal spray Place 1 spray into both nostrils as needed for congestion. 11/28/19   Thornell Mule, MD  tamsulosin (FLOMAX) 0.4 MG CAPS capsule Take 0.4 mg by mouth daily. 06/07/22   [provider]  temazepam (RESTORIL) 15 MG capsule Take 1 capsule (15 mg total) by mouth at bedtime as needed for sleep. 06/09/16   Reyne Dumas, MD  traZODone (DESYREL) 100 MG tablet Take 100 mg by mouth at bedtime. 06/07/22   [provider]  TRULICITY 1.5 OV/7.8HY SOPN Inject 1.5 mg into the skin once a week. 04/12/22    [provider]  venlafaxine (EFFEXOR) 37.5 MG tablet Take 37.5 mg by mouth daily. 06/07/22   [provider]    Physical Exam: Vitals:   07/20/22 0300 07/20/22 0500 07/20/22 0630 07/20/22 0700  BP: 106/60 108/76  127/66  Pulse: 74 77  89  Resp: '16 16  16  '$ Temp:   98.4 F (36.9 C)   TempSrc:   Oral   SpO2: 96% 98%  93%  Weight:        Physical Exam Vitals and nursing note reviewed.  Constitutional:      General: He is not in acute distress.    Appearance: He is obese. He is ill-appearing.  HENT:     Head: Normocephalic and atraumatic.     Mouth/Throat:     Mouth: Mucous membranes are moist.     Comments: Edentulous with dentures Eyes:     Extraocular Movements: Extraocular movements intact.     Conjunctiva/sclera: Conjunctivae normal.     Pupils: Pupils are equal, round, and reactive to light.  Cardiovascular:     Rate and Rhythm: Normal rate and regular rhythm.     Pulses: Normal pulses.     Heart sounds: Normal heart sounds.  Pulmonary:     Effort: Pulmonary effort is normal.     Breath sounds: Normal breath sounds. No wheezing or rales.  Abdominal:     General: There is no distension.     Tenderness: There is abdominal tenderness. There is guarding and rebound.     Comments: Protuberant abdomen, scattered soft BS, generalized tenderness worse RLQ with guarding and mild rebound, w/o rigidity.  Musculoskeletal:     Cervical back: Normal range of motion. No rigidity.     Comments: Right forearm swollen, erythematous, very warm to touch, very tender to touch. At proximal forearm 1.5 cm open wound with purulent drainage.  Lymphadenopathy:     Cervical: No cervical adenopathy.  Skin:    Findings: Lesion present.  Neurological:     Mental Status: He is alert and oriented to person, place, and time. Mental status is at baseline.  Psychiatric:        Mood and Affect: Mood normal.      Labs on Admission: I have personally reviewed following labs and  imaging studies  CBC: Recent Labs  Lab 07/20/22 0033  WBC 12.1*  NEUTROABS 8.4*  HGB 11.9*  HCT 36.1*  MCV 88.0  PLT 209   Basic Metabolic Panel: Recent Labs  Lab 07/20/22 0033  NA 139  K 3.9  CL 104  CO2 26  GLUCOSE 97  BUN 15  CREATININE 0.81  CALCIUM 9.1   GFR: CrCl cannot be calculated (Unknown ideal weight.). Liver Function Tests: Recent Labs  Lab 07/20/22 0033  AST 13*  ALT 12  ALKPHOS 74  BILITOT 0.5  PROT 7.3  ALBUMIN 3.2*   No results for input(s): "LIPASE", "AMYLASE" in the last 168 hours. No results for input(s): "AMMONIA" in the last 168 hours. Coagulation Profile: Recent Labs  Lab 07/20/22 0033  INR 1.1   Cardiac Enzymes: No results for input(s): "CKTOTAL", "CKMB", "CKMBINDEX", "TROPONINI" in the last 168 hours. BNP (last 3 results) No results for input(s): "PROBNP" in the last 8760 hours. HbA1C: No results for input(s): "HGBA1C" in the last 72 hours. CBG: No results for input(s): "GLUCAP" in the last 168 hours. Lipid Profile: No results for input(s): "CHOL", "HDL", "LDLCALC", "TRIG", "CHOLHDL", "LDLDIRECT" in the last 72 hours. Thyroid Function Tests: No results for input(s): "TSH", "T4TOTAL", "FREET4", "T3FREE", "THYROIDAB" in the last 72 hours. Anemia Panel: No results for input(s): "VITAMINB12", "FOLATE", "FERRITIN", "TIBC", "IRON", "RETICCTPCT" in the last 72 hours. Urine analysis:    Component Value Date/Time   COLORURINE YELLOW 07/20/2022 0012   APPEARANCEUR HAZY (A) 07/20/2022 0012   LABSPEC 1.006 07/20/2022 0012   PHURINE 7.0 07/20/2022 0012   GLUCOSEU NEGATIVE 07/20/2022 0012   HGBUR SMALL (A) 07/20/2022 0012   BILIRUBINUR NEGATIVE 07/20/2022 0012   KETONESUR NEGATIVE 07/20/2022 0012   PROTEINUR NEGATIVE 07/20/2022 0012   NITRITE NEGATIVE 07/20/2022 0012   LEUKOCYTESUR LARGE (A) 07/20/2022 0012    Radiological Exams on Admission: I have personally reviewed images  DG Forearm Right  Result Date: 07/20/2022 CLINICAL  DATA:  Recent blunt trauma 1 week ago with pain and swelling, initial encounter EXAM: RIGHT FOREARM - 2 VIEW COMPARISON:  None Available. FINDINGS: No acute fracture or dislocation is noted. Olecranon spurring is seen. Mild soft tissue swelling is noted consistent with the recent injury. IMPRESSION: Soft tissue swelling without acute bony abnormality. Electronically Signed   By: Inez Catalina M.D.   On: 07/20/2022 01:25   DG Forearm Left  Result Date: 07/20/2022 CLINICAL DATA:  Bilateral arm swelling, no known injury, initial encounter EXAM: LEFT FOREARM - 2 VIEW COMPARISON:  None Available. FINDINGS: No acute fracture or dislocation is noted. No soft tissue abnormality is seen. IMPRESSION: No acute abnormality noted. Electronically Signed   By: Inez Catalina M.D.   On: 07/20/2022 01:25   DG Chest Port 1 View  Result Date: 07/20/2022 CLINICAL DATA:  Possible sepsis EXAM: PORTABLE CHEST 1 VIEW COMPARISON:  11/21/2021 FINDINGS: Cardiac shadow is within normal limits. Lungs are well aerated bilaterally. Mild atelectatic changes are noted in the medial right lung base. No other focal abnormality is seen. IMPRESSION: Mild right basilar atelectasis. Electronically Signed   By: Inez Catalina M.D.   On: 07/20/2022 01:24    EKG: I have personally reviewed EKG: NSR, LAE, no acute changes  Assessment/Plan Principal Problem:   Right forearm cellulitis Active Problems:   Acute abdominal pain   HTN (hypertension)    Assessment and Plan: * Right forearm cellulitis Patient with h/o MRSA cellulitis in the past. Now with infection right forearm with 1.5 cm open wound proximally with purulent drainage, swelling and tenderness  Plan Vancomycin and Zosyn coverage  Warm soaks TID  Dry dressing with bactroban  Surgical consult for possible I&D  Acute abdominal pain Patient reports 2-3 days of worsening abdominal pain, worse at RLQ. He endorses N/V, poor tolerance food and fluids.   Plan CT abd/pelvis  Zosyn  q8 while diagnostic w/u in progress.  HTN (hypertension) Patient on multiple medications. BP low stable at admission.  Plan Continue home meds.   Disposition - TOC re: placement vs HH services     DVT prophylaxis: SQ Heparin Code Status: Full Code Family Communication: emergency contact number for [employer] not in service  Disposition Plan: TBD  Consults called: ortho - consult request placed by EDP  Admission status: Inpatient, Med-Surg   Adella Hare, MD [facility] 07/20/2022, 7:46 AM

## 2022-07-21 DIAGNOSIS — L03113 Cellulitis of right upper limb: Secondary | ICD-10-CM | POA: Diagnosis not present

## 2022-07-21 LAB — HIV ANTIBODY (ROUTINE TESTING W REFLEX): HIV Screen 4th Generation wRfx: NONREACTIVE

## 2022-07-21 LAB — BASIC METABOLIC PANEL
Anion gap: 9 (ref 5–15)
BUN: 19 mg/dL (ref 6–20)
CO2: 26 mmol/L (ref 22–32)
Calcium: 8.5 mg/dL — ABNORMAL LOW (ref 8.9–10.3)
Chloride: 104 mmol/L (ref 98–111)
Creatinine, Ser: 1.1 mg/dL (ref 0.61–1.24)
GFR, Estimated: >60 mL/min (ref >60)
Glucose, Bld: 109 mg/dL — ABNORMAL HIGH (ref 70–99)
Potassium: 4 mmol/L (ref 3.5–5.1)
Sodium: 139 mmol/L (ref 135–145)

## 2022-07-21 LAB — CBC
HCT: 38 % — ABNORMAL LOW (ref 39.0–52.0)
Hemoglobin: 12.4 g/dL — ABNORMAL LOW (ref 13.0–17.0)
MCH: 29 pg (ref 26.0–34.0)
MCHC: 32.6 g/dL (ref 30.0–36.0)
MCV: 88.8 fL (ref 80.0–100.0)
Platelets: 278 10*3/uL (ref 150–400)
RBC: 4.28 MIL/uL (ref 4.22–5.81)
RDW: 15.6 % — ABNORMAL HIGH (ref 11.5–15.5)
WBC: 5.2 10*3/uL (ref 4.0–10.5)
nRBC: 0 % (ref 0.0–0.2)

## 2022-07-21 MED ORDER — OXYCODONE HCL 5 MG PO TABS
5.0000 mg | ORAL_TABLET | ORAL | Status: DC | PRN
  Administered 2022-07-21 – 2022-07-22 (×4): 10 mg via ORAL
  Filled 2022-07-21 (×4): qty 2

## 2022-07-21 MED ORDER — VANCOMYCIN HCL 2000 MG/400ML IV SOLN
2000.0000 mg | INTRAVENOUS | Status: DC
Start: 2022-07-21 — End: 2022-07-22
  Administered 2022-07-21: 2000 mg via INTRAVENOUS
  Filled 2022-07-21: qty 400

## 2022-07-21 NOTE — Progress Notes (Addendum)
Pt expresses concern that he will have a wound after surgery with no one to help him take care of it. Pt advised to explain his concern to the provider to see if case management can help with home care or longer term care facility. Pt states he will speak to provider. Also, pt states his back hurts because he fell at home. The lumbar/ pelvic region is where pt describes the location of pain.

## 2022-07-21 NOTE — Progress Notes (Signed)
PROGRESS NOTE  Ronnie Moore IOE:[id number] DOB: 02-01-1961 DOA: 07/19/2022 PCP: Patient, No Pcp Per   LOS: 1 day   Brief Narrative / Interim history: 61 year old male with history of paraplegia secondary to spinal cord stroke, HTN, chronic low back pain, previous MRSA soft tissue infections in the past comes in with bilateral arm swelling and redness for the past several days, worse on the right forearm.  He was placed on antibiotics, admitted to the hospital, and orthopedic surgery consulted  Subjective / 24h Interval events: Complains of severe low back pain, tells me it is chronic and has been having this type of pain for several years  Assesement and Plan: Principal Problem:   Right forearm cellulitis Active Problems:   Acute abdominal pain   HTN (hypertension)  Principal problem Right forearm cellulitis-patient with history of MRSA in the past, now with infected right forearm with a 1.5 cm open wound proximally with purulent drainage, swelling and tenderness.  Continue vancomycin.  CT scans of the left and right forearms showed changes consistent with cellulitis, no fluid collections noted.  Orthopedic surgery consulted, no plans for surgery right now, continue IV antibiotics  Active problems Abdominal pain-improved.  CT scan without acute findings but it did show moderate stool burden.  Essential hypertension-continue home medications  Hyperlipidemia-continue statin  Chronic back pain-has been going on for years.  Oxycodone while here  Spinal cord stroke-June 2016.  Follows as an outpatient  Normocytic anemia-of chronic disease, hemoglobin stable, no bleeding  Scheduled Meds:  amLODipine  5 mg Oral Daily   atorvastatin  40 mg Oral QHS   docusate sodium  100 mg Oral BID   DULoxetine  120 mg Oral QHS   furosemide  20 mg Oral Daily   gabapentin  400 mg Oral TID   heparin  5,000 Units Subcutaneous Q8H   hydrOXYzine  25 mg Oral TID   isosorbide mononitrate  60 mg Oral  Daily   ketorolac  30 mg Intravenous Q6H   loratadine  10 mg Oral Daily   lubiprostone  24 mcg Oral Daily   metoprolol tartrate  25 mg Oral Daily   pantoprazole  40 mg Oral Daily   polyethylene glycol  17 g Oral BID   potassium chloride SA  20 mEq Oral Daily   tamsulosin  0.4 mg Oral Daily   traZODone  100 mg Oral QHS   venlafaxine  37.5 mg Oral Daily   Continuous Infusions:  vancomycin     PRN Meds:.fentaNYL (SUBLIMAZE) injection, oxyCODONE, phenol, sodium chloride  Diet Orders (From admission, onward)     Start     Ordered   07/20/22 1253  Diet regular Room service appropriate? Yes; Fluid consistency: Thin  Diet effective now       Question Answer Comment  Room service appropriate? Yes   Fluid consistency: Thin      07/20/22 1252            DVT prophylaxis: heparin injection 5,000 Units Start: 07/20/22 0800   Lab Results  Component Value Date   PLT 278 07/21/2022      Code Status: Full Code  Family Communication: no family at bedside   Status is: Inpatient  Remains inpatient appropriate because: iv antbiotics   Level of care: Telemetry  Consultants:  Orthopedic surgery   Objective: Vitals:   07/20/22 1700 07/20/22 2011 07/21/22 0353 07/21/22 0834  BP:  126/73 120/65 136/78  Pulse:  75 74 73  Resp:  20 18 18  Temp:  98.5 F (36.9 C) 97.9 F (36.6 C) (!) 97.5 F (36.4 C)  TempSrc:    Oral  SpO2:  96% 93% 93%  Weight: 118.2 kg     Height: 5' 8.5" (1.74 m)       Intake/Output Summary (Last 24 hours) at 07/21/2022 1330 Last data filed at 07/21/2022 0355 Gross per 24 hour  Intake 1507.04 ml  Output 1050 ml  Net 457.04 ml   Wt Readings from Last 3 Encounters:  07/20/22 118.2 kg  01/09/21 118 kg  11/16/19 113.4 kg    Examination:  Constitutional: NAD Eyes: no scleral icterus ENMT: Mucous membranes are moist.  Neck: normal, supple Respiratory: clear to auscultation bilaterally, no wheezing, no crackles. Normal respiratory effort.   Cardiovascular: Regular rate and rhythm, no murmurs / rubs / gallops. No LE edema.  Abdomen: non distended, no tenderness. Bowel sounds positive.  Musculoskeletal: no clubbing / cyanosis.   Data Reviewed: I have independently reviewed following labs and imaging studies   CBC Recent Labs  Lab 07/20/22 0033 07/21/22 0740  WBC 12.1* 5.2  HGB 11.9* 12.4*  HCT 36.1* 38.0*  PLT 293 278  MCV 88.0 88.8  MCH 29.0 29.0  MCHC 33.0 32.6  RDW 15.7* 15.6*  LYMPHSABS 2.4  --   MONOABS 0.9  --   EOSABS 0.3  --   BASOSABS 0.0  --     Recent Labs  Lab 07/20/22 0033 07/21/22 0740  NA 139 139  K 3.9 4.0  CL 104 104  CO2 26 26  GLUCOSE 97 109*  BUN 15 19  CREATININE 0.81 1.10  CALCIUM 9.1 8.5*  AST 13*  --   ALT 12  --   ALKPHOS 74  --   BILITOT 0.5  --   ALBUMIN 3.2*  --   LATICACIDVEN 0.8  --   INR 1.1  --   HGBA1C 5.4  --     ------------------------------------------------------------------------------------------------------------------ No results for input(s): "CHOL", "HDL", "LDLCALC", "TRIG", "CHOLHDL", "LDLDIRECT" in the last 72 hours.  Lab Results  Component Value Date   HGBA1C 5.4 07/20/2022   ------------------------------------------------------------------------------------------------------------------ No results for input(s): "TSH", "T4TOTAL", "T3FREE", "THYROIDAB" in the last 72 hours.  Invalid input(s): "FREET3"  Cardiac Enzymes No results for input(s): "CKMB", "TROPONINI", "MYOGLOBIN" in the last 168 hours.  Invalid input(s): "CK" ------------------------------------------------------------------------------------------------------------------ No results found for: "BNP"  CBG: No results for input(s): "GLUCAP" in the last 168 hours.  Recent Results (from the past 240 hour(s))  Blood Culture (routine x 2)     Status: None (Preliminary result)   Collection Time: 07/20/22 12:20 AM   Specimen: BLOOD  Result Value Ref Range Status   Specimen  Description   Final    BLOOD RIGHT ANTECUBITAL Performed at [facility] [address] [place], [place] [address]    Special Requests   Final    BOTTLES DRAWN AEROBIC AND ANAEROBIC Blood Culture adequate volume Performed at [facility] [address] [place], [place] [address]    Culture   Final    NO GROWTH 1 DAY Performed at [facility], [place] [address], [place], [place] [address]    Report Status PENDING  Incomplete  Blood Culture (routine x 2)     Status: None (Preliminary result)   Collection Time: 07/20/22 12:33 AM   Specimen: BLOOD  Result Value Ref Range Status   Specimen Description   Final    BLOOD LEFT ANTECUBITAL Performed at [facility], [place] [address] [place], [place] [address]    Special Requests   Final    BOTTLES DRAWN AEROBIC AND ANAEROBIC Blood Culture adequate volume Performed at [facility] [address] [place], [place] [address]    Culture   Final    NO GROWTH 1 DAY Performed at [facility], [place] [address], [place], [place] [address]    Report Status PENDING  Incomplete     Radiology Studies: No results found.   Marzetta Board, MD, PhD [facility]  Between 7 am - 7 pm I am available, please contact me via Amion (for emergencies) or Securechat (non urgent messages)  Between 7 pm - 7 am I am not available, please contact night coverage MD/APP via Amion

## 2022-07-21 NOTE — Progress Notes (Signed)
Pharmacy Antibiotic Note Ronnie Moore is a 61 y.o. male admitted on 07/19/2022 with Right forearm cellulitis with open wound, purulent drainage, swelling and tenderness. H/o MRSA cellulitis. Pharmacy has been consulted for Vancomycin dosing.  Today, 07/21/2022: D2 abx Afebrile, WBC stable WNL SCr up ~40%  Plan: Decrease vancomycin to 2g IV q24 hr (AUC 534 w/ SCr 1.1; Vd 0.5) with worsening SCr Measure Vanc levels as needed Zosyn (per MD) stopped   Height: 5' 8.5" (174 cm) Weight: 118.2 kg (260 lb 9.3 oz) IBW/kg (Calculated) : 69.55  Temp (24hrs), Avg:98.2 F (36.8 C), Min:97.5 F (36.4 C), Max:98.8 F (37.1 C)  Recent Labs  Lab 07/20/22 0033 07/21/22 0740  WBC 12.1* 5.2  CREATININE 0.81 1.10  LATICACIDVEN 0.8  --      Estimated Creatinine Clearance: 91 mL/min (by C-G formula based on SCr of 1.1 mg/dL).    Allergies  Allergen Reactions   Skelaxin [Metaxalone] Swelling    Lips swelled     Antimicrobials this admission: 8/15 Vancomycin >>  8/15 Zosyn >> 8/16  Dose adjustments this admission: 8/16 reduce vancomycin to 2000 mg   Microbiology results: 8/15 BCx: ngtd  Thank you for allowing pharmacy to be a part of this patient's care.   Reuel Boom, PharmD, BCPS [phone] 07/21/2022, 10:55 AM

## 2022-07-21 NOTE — Plan of Care (Signed)
Pt continues to rate back pain as severe. Pt did sleep well between doses of pain medication. NPO since midnight, however pt. is unsure whether he will agree to surgery on his arm at this time. Problem: Education: Goal: Knowledge of General Education information will improve Description: Including pain rating scale, medication(s)/side effects and non-pharmacologic comfort measures 07/21/2022 0433 by Maricela Bo, RN Outcome: Progressing 07/21/2022 0433 by Maricela Bo, RN Outcome: Progressing   Problem: Health Behavior/Discharge Planning: Goal: Ability to manage health-related needs will improve Outcome: Progressing   Problem: Clinical Measurements: Goal: Ability to maintain clinical measurements within normal limits will improve Outcome: Progressing Goal: Will remain free from infection Outcome: Progressing Goal: Diagnostic test results will improve 07/21/2022 0433 by Maricela Bo, RN Outcome: Progressing 07/21/2022 0433 by Maricela Bo, RN Outcome: Progressing Goal: Respiratory complications will improve 07/21/2022 0433 by Maricela Bo, RN Outcome: Progressing 07/21/2022 0433 by Maricela Bo, RN Outcome: Progressing Goal: Cardiovascular complication will be avoided 07/21/2022 0433 by Maricela Bo, RN Outcome: Progressing 07/21/2022 0433 by Maricela Bo, RN Outcome: Progressing   Problem: Activity: Goal: Risk for activity intolerance will decrease 07/21/2022 0433 by Maricela Bo, RN Outcome: Progressing 07/21/2022 0433 by Maricela Bo, RN Outcome: Progressing   Problem: Nutrition: Goal: Adequate nutrition will be maintained 07/21/2022 0433 by Maricela Bo, RN Outcome: Progressing 07/21/2022 0433 by Maricela Bo, RN Outcome: Progressing   Problem: Coping: Goal: Level of anxiety will decrease 07/21/2022 0433 by Maricela Bo, RN Outcome: Progressing 07/21/2022 0433 by Maricela Bo, RN Outcome: Progressing   Problem: Elimination: Goal:  Will not experience complications related to bowel motility 07/21/2022 0433 by Maricela Bo, RN Outcome: Progressing 07/21/2022 0433 by Maricela Bo, RN Outcome: Progressing Goal: Will not experience complications related to urinary retention 07/21/2022 0433 by Maricela Bo, RN Outcome: Progressing 07/21/2022 0433 by Maricela Bo, RN Outcome: Progressing   Problem: Pain Managment: Goal: General experience of comfort will improve 07/21/2022 0433 by Maricela Bo, RN Outcome: Progressing 07/21/2022 0433 by Maricela Bo, RN Outcome: Progressing   Problem: Safety: Goal: Ability to remain free from injury will improve 07/21/2022 0433 by Maricela Bo, RN Outcome: Progressing 07/21/2022 0433 by Maricela Bo, RN Outcome: Progressing   Problem: Skin Integrity: Goal: Risk for impaired skin integrity will decrease 07/21/2022 0433 by Maricela Bo, RN Outcome: Progressing 07/21/2022 0433 by Maricela Bo, RN Outcome: Progressing
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
## 2022-07-22 DIAGNOSIS — L03113 Cellulitis of right upper limb: Secondary | ICD-10-CM | POA: Diagnosis not present

## 2022-07-22 LAB — CREATININE, SERUM
Creatinine, Ser: 1.28 mg/dL — ABNORMAL HIGH (ref 0.61–1.24)
GFR, Estimated: >60 mL/min (ref >60)

## 2022-07-22 MED ORDER — DOXYCYCLINE HYCLATE 100 MG PO CAPS: 100.0000 mg | ORAL_CAPSULE | Freq: Two times a day (BID) | ORAL | 0 refills | Status: AC

## 2022-07-22 MED ORDER — OXYCODONE HCL 5 MG PO TABS: 5.0000 mg | ORAL_TABLET | ORAL | 0 refills | Status: AC | PRN

## 2022-07-22 NOTE — Discharge Summary (Signed)
Physician Discharge Summary  Ronnie Moore IFO:[id number] DOB: 13-Aug-1961 DOA: 07/19/2022  PCP: Patient, No Pcp Per  Admit date: 07/19/2022 Discharge date: 07/22/2022  Admitted From: home Disposition:  home  Recommendations for Outpatient Follow-up:  Follow up with PCP in 1-2 weeks Please obtain BMP/CBC in one week  Home Health: none Equipment/Devices: none  Discharge Condition: stable CODE STATUS: Full code Diet Orders (From admission, onward)     Start     Ordered   07/20/22 1253  Diet regular Room service appropriate? Yes; Fluid consistency: Thin  Diet effective now       Question Answer Comment  Room service appropriate? Yes   Fluid consistency: Thin      07/20/22 1252            HPI: Per admitting MD, 61 year old male with past medical history of paraplegia secondary to spinal cord stroke, hypertension, chronic low back pain, previous MRSA soft tissue infections in the past presenting with bilateral arm swelling / red for the past several days.  Worst on the right forearm.  Multiple areas of abscesses?  Secondary problem - acute abdominal pain, worse at RLQ along with N/V   Hospital Course / Discharge diagnoses: Principal Problem:   Right forearm cellulitis Active Problems:   Acute abdominal pain   HTN (hypertension)   Principal problem Right forearm cellulitis-patient with history of MRSA in the past, now with infected right forearm with a 1.5 cm open wound proximally with purulent drainage, swelling and tenderness. Orthopedic surgery consulted and followed patient while hospitalized, patient did not require surgery.  CT scans of the left and right forearms showed changes consistent with cellulitis, no fluid collections noted.  He was placed on IV Vancomycin, cellulitis resolved and wound looks improved. He will be transitioned to doxycycline and discharged home in stable condition. His WBC normalized and he is afebrile. Due to pain will place on short course of  oxycodone.   Active problems Abdominal pain-improved.  CT scan without acute findings but it did show moderate stool burden. Essential hypertension-continue home medications Hyperlipidemia-continue statin Chronic back pain-has been going on for years. Spinal cord stroke-June 2016.  Follows as an outpatient Normocytic anemia-of chronic disease, hemoglobin stable, no bleeding  Sepsis ruled out   Discharge Instructions   Allergies as of 07/22/2022       Reactions   Skelaxin [metaxalone] Swelling   Lips swelled         Medication List     TAKE these medications    amLODipine 5 MG tablet Commonly known as: NORVASC Take 5 mg by mouth daily.   atorvastatin 40 MG tablet Commonly known as: LIPITOR Take 40 mg by mouth at bedtime.   cyclobenzaprine 7.5 MG tablet Commonly known as: FEXMID Take 1 tablet (7.5 mg total) by mouth 3 (three) times daily as needed for muscle spasms.   doxycycline 100 MG capsule Commonly known as: VIBRAMYCIN Take 1 capsule (100 mg total) by mouth 2 (two) times daily for 10 days.   furosemide 20 MG tablet Commonly known as: LASIX Take 20 mg by mouth daily.   gabapentin 400 MG capsule Commonly known as: NEURONTIN Take 1 capsule (400 mg total) by mouth 3 (three) times daily.   hydrOXYzine 25 MG tablet Commonly known as: ATARAX Take 25 mg by mouth 3 (three) times daily.   isosorbide mononitrate 60 MG 24 hr tablet Commonly known as: IMDUR Take 60 mg by mouth daily.   lubiprostone 24 MCG capsule Commonly known  as: AMITIZA Take 24 mcg by mouth daily.   meloxicam 15 MG tablet Commonly known as: MOBIC Take 1 tablet (15 mg total) by mouth daily.   metoprolol tartrate 25 MG tablet Commonly known as: LOPRESSOR Take 25 mg by mouth daily.   oxyCODONE 5 MG immediate release tablet Commonly known as: Oxy IR/ROXICODONE Take 1-2 tablets (5-10 mg total) by mouth every 4 (four) hours as needed for up to 5 days for breakthrough pain or severe  pain.   potassium chloride SA 20 MEQ tablet Commonly known as: KLOR-CON M Take 20 mEq by mouth daily.   tamsulosin 0.4 MG Caps capsule Commonly known as: FLOMAX Take 0.4 mg by mouth daily.   traZODone 100 MG tablet Commonly known as: DESYREL Take 100 mg by mouth at bedtime.   Trulicity 1.5 VQ/0.0QQ Sopn Generic drug: Dulaglutide Inject 1.5 mg into the skin once a week.   venlafaxine 37.5 MG tablet Commonly known as: EFFEXOR Take 37.5 mg by mouth daily.         Consultations: Orthopedic surgery   Procedures/Studies:  CT FOREARM RIGHT W CONTRAST  Result Date: 07/20/2022 CLINICAL DATA:  Soft tissue infection suspected, forearm, no prior imaging EXAM: CT OF THE UPPER RIGHT EXTREMITY WITH CONTRAST CT OF THE UPPER LEFT EXTREMITY WITH CONTRAST TECHNIQUE: Multidetector CT imaging of the upper right extremity was performed according to the standard protocol following intravenous contrast administration. Multidetector CT imaging of the upper left extremity was performed according to the standard protocol following intravenous contrast administration. RADIATION DOSE REDUCTION: This exam was performed according to the departmental dose-optimization program which includes automated exposure control, adjustment of the mA and/or kV according to patient size and/or use of iterative reconstruction technique. CONTRAST:  129m OMNIPAQUE IOHEXOL 300 MG/ML SOLN (50 cc for each arm) COMPARISON:  Four radiographs 07/20/2022. FINDINGS: RIGHT UPPER EXTREMITY: Bones/Joint/Cartilage There is no evidence of acute fracture. Alignment is normal. Mild degenerative changes of the elbow. Olecranon enthesophyte. No bony erosion or frank bony destruction. Ligaments Suboptimally assessed by CT. Muscles and Tendons No acute myotendinous abnormality by CT. No visible intramuscular collection. Soft tissues There is stents of skin thickening and subcutaneous soft tissue swelling of the right forearm. There is no  well-defined/drainable collection by CT within the context of minimal contrast. The most coalescence subcutaneous fluid appears to be along the proximal forearm. No soft tissue gas. There is no enlarged epitrochlear lymph node measuring 1.1 cm (series 3, image 34). LEFT UPPER EXTREMITY: Bones/Joint/Cartilage There is no evidence of acute fracture. Alignment is normal. There is no bony erosion or frank bony destruction. Small olecranon enthesophyte. Mild degenerative changes of the elbow. Ligaments Suboptimally assessed by CT. Muscles and Tendons No acute myotendinous abnormality by CT. No visible intramuscular collection. Soft tissues Extensive skin thickening and subcutaneous soft tissue swelling of the left forearm. There is no well-defined/drainable collection by CT, within the context of minimal contrast, though there are a few areas of superficial fascial nodular thickening in the proximal anterior forearm (series 3, images 39-41 and image 51). No soft tissue gas. Prominent epitrochlear lymph node measuring 0.7 cm. IMPRESSION: Extensive skin thickening and subcutaneous soft tissue swelling of the right and left forearms, as can be seen in cellulitis. No well-defined fluid collection by CT, within the context of minimal contrast. The most coalescent fluid on the right appears to be along the proximal lateral forearm. There are a few areas of superficial fascial nodular thickening in the proximal anterior left forearm. If clinically indicated,  these areas could be further evaluated with targeted ultrasound, or MRI for improved soft tissue contrast. No osseous changes to suggest osteomyelitis by CT. Electronically Signed   By: Maurine Simmering M.D.   On: 07/20/2022 12:07   CT FOREARM LEFT W CONTRAST  Result Date: 07/20/2022 CLINICAL DATA:  Soft tissue infection suspected, forearm, no prior imaging EXAM: CT OF THE UPPER RIGHT EXTREMITY WITH CONTRAST CT OF THE UPPER LEFT EXTREMITY WITH CONTRAST TECHNIQUE:  Multidetector CT imaging of the upper right extremity was performed according to the standard protocol following intravenous contrast administration. Multidetector CT imaging of the upper left extremity was performed according to the standard protocol following intravenous contrast administration. RADIATION DOSE REDUCTION: This exam was performed according to the departmental dose-optimization program which includes automated exposure control, adjustment of the mA and/or kV according to patient size and/or use of iterative reconstruction technique. CONTRAST:  143m OMNIPAQUE IOHEXOL 300 MG/ML SOLN (50 cc for each arm) COMPARISON:  Four radiographs 07/20/2022. FINDINGS: RIGHT UPPER EXTREMITY: Bones/Joint/Cartilage There is no evidence of acute fracture. Alignment is normal. Mild degenerative changes of the elbow. Olecranon enthesophyte. No bony erosion or frank bony destruction. Ligaments Suboptimally assessed by CT. Muscles and Tendons No acute myotendinous abnormality by CT. No visible intramuscular collection. Soft tissues There is stents of skin thickening and subcutaneous soft tissue swelling of the right forearm. There is no well-defined/drainable collection by CT within the context of minimal contrast. The most coalescence subcutaneous fluid appears to be along the proximal forearm. No soft tissue gas. There is no enlarged epitrochlear lymph node measuring 1.1 cm (series 3, image 34). LEFT UPPER EXTREMITY: Bones/Joint/Cartilage There is no evidence of acute fracture. Alignment is normal. There is no bony erosion or frank bony destruction. Small olecranon enthesophyte. Mild degenerative changes of the elbow. Ligaments Suboptimally assessed by CT. Muscles and Tendons No acute myotendinous abnormality by CT. No visible intramuscular collection. Soft tissues Extensive skin thickening and subcutaneous soft tissue swelling of the left forearm. There is no well-defined/drainable collection by CT, within the context  of minimal contrast, though there are a few areas of superficial fascial nodular thickening in the proximal anterior forearm (series 3, images 39-41 and image 51). No soft tissue gas. Prominent epitrochlear lymph node measuring 0.7 cm. IMPRESSION: Extensive skin thickening and subcutaneous soft tissue swelling of the right and left forearms, as can be seen in cellulitis. No well-defined fluid collection by CT, within the context of minimal contrast. The most coalescent fluid on the right appears to be along the proximal lateral forearm. There are a few areas of superficial fascial nodular thickening in the proximal anterior left forearm. If clinically indicated, these areas could be further evaluated with targeted ultrasound, or MRI for improved soft tissue contrast. No osseous changes to suggest osteomyelitis by CT. Electronically Signed   By: JMaurine SimmeringM.D.   On: 07/20/2022 12:07   CT ABDOMEN PELVIS W CONTRAST  Result Date: 07/20/2022 CLINICAL DATA:  Right lower quadrant pain with nausea and vomiting. Back pain. EXAM: CT ABDOMEN AND PELVIS WITH CONTRAST TECHNIQUE: Multidetector CT imaging of the abdomen and pelvis was performed using the standard protocol following bolus administration of intravenous contrast. RADIATION DOSE REDUCTION: This exam was performed according to the departmental dose-optimization program which includes automated exposure control, adjustment of the mA and/or kV according to patient size and/or use of iterative reconstruction technique. CONTRAST:  1061mOMNIPAQUE IOHEXOL 300 MG/ML  SOLN COMPARISON:  CT abdomen/pelvis 11/16/2019, 02/27/2021 FINDINGS: Lower chest: The imaged  lung bases are clear. Coronary artery calcifications are noted. The imaged heart is otherwise unremarkable. Hepatobiliary: The liver and gallbladder are unremarkable. There is no biliary ductal dilatation. Pancreas: Unremarkable. Spleen: Unremarkable. Adrenals/Urinary Tract: The adrenals are unremarkable. A  hypodense lesion in the right kidney upper pole likely reflects a benign cyst for which no specific imaging follow-up is required. There are no other focal lesions. There are no stones. There is no hydronephrosis or hydroureter. There is symmetric excretion of contrast into the collecting systems on the delayed images. The bladder wall is mildly thickened with a few small right-sided diverticula raising suspicion for chronic outlet obstruction. Stomach/Bowel: The stomach is unremarkable. There is no evidence of bowel obstruction. There is no abnormal bowel wall thickening or inflammatory change. There is a moderate stool burden throughout the colon. The appendix is normal. Vascular/Lymphatic: There is calcified atherosclerotic plaque in the nonaneurysmal abdominal aorta. The major branch vessels appear patent. The main portal and splenic veins appear patent. There are prominent left iliac chain lymph nodes measuring up to 10 mm, unchanged since at least 2017. Mildly prominent inguinal lymph nodes are not significantly changed. There is no new or progressive lymphadenopathy in the abdomen or pelvis. Reproductive: The prostate and seminal vesicles are unremarkable. Other: There is no ascites or free air. Musculoskeletal: There is no acute osseous abnormality or suspicious osseous lesion. There are flowing osteophytes throughout the imaged thoracic spine extending into the lumbar spine consistent with diffuse idiopathic skeletal hyperostosis. There is advanced facet arthropathy throughout the lumbar spine with likely multilevel spinal canal stenosis. IMPRESSION: 1. No acute finding in the abdomen or pelvis. 2. Moderate stool burden throughout the colon. 3. Bladder wall thickening and small diverticula raising suspicion for chronic outlet obstruction or neurogenic bladder. 4. Advanced multilevel degenerative change throughout the imaged spine with multilevel advanced facet arthropathy and likely multilevel spinal canal  stenosis. Electronically Signed   By: Valetta Mole M.D.   On: 07/20/2022 09:50   DG Forearm Right  Result Date: 07/20/2022 CLINICAL DATA:  Recent blunt trauma 1 week ago with pain and swelling, initial encounter EXAM: RIGHT FOREARM - 2 VIEW COMPARISON:  None Available. FINDINGS: No acute fracture or dislocation is noted. Olecranon spurring is seen. Mild soft tissue swelling is noted consistent with the recent injury. IMPRESSION: Soft tissue swelling without acute bony abnormality. Electronically Signed   By: Inez Catalina M.D.   On: 07/20/2022 01:25   DG Forearm Left  Result Date: 07/20/2022 CLINICAL DATA:  Bilateral arm swelling, no known injury, initial encounter EXAM: LEFT FOREARM - 2 VIEW COMPARISON:  None Available. FINDINGS: No acute fracture or dislocation is noted. No soft tissue abnormality is seen. IMPRESSION: No acute abnormality noted. Electronically Signed   By: Inez Catalina M.D.   On: 07/20/2022 01:25   DG Chest Port 1 View  Result Date: 07/20/2022 CLINICAL DATA:  Possible sepsis EXAM: PORTABLE CHEST 1 VIEW COMPARISON:  11/21/2021 FINDINGS: Cardiac shadow is within normal limits. Lungs are well aerated bilaterally. Mild atelectatic changes are noted in the medial right lung base. No other focal abnormality is seen. IMPRESSION: Mild right basilar atelectasis. Electronically Signed   By: Inez Catalina M.D.   On: 07/20/2022 01:24     Subjective: - no chest pain, shortness of breath, no abdominal pain, nausea or vomiting.   Discharge Exam: BP 127/69 (BP Location: Right Leg)   Pulse 68   Temp 97.8 F (36.6 C)   Resp 16   Ht 5' 8.5" (  1.74 m)   Wt 118.2 kg   SpO2 95%   BMI 39.05 kg/m   General: Pt is alert, awake, not in acute distress Cardiovascular: RRR, S1/S2 +, no rubs, no gallops Respiratory: CTA bilaterally, no wheezing, no rhonchi Abdominal: Soft, NT, ND, bowel sounds + Extremities: no edema, no cyanosis   The results of significant diagnostics from this  hospitalization (including imaging, microbiology, ancillary and laboratory) are listed below for reference.     Microbiology: Recent Results (from the past 240 hour(s))  Blood Culture (routine x 2)     Status: None (Preliminary result)   Collection Time: 07/20/22 12:20 AM   Specimen: BLOOD  Result Value Ref Range Status   Specimen Description   Final    BLOOD RIGHT ANTECUBITAL Performed at [facility] [address] [place], [place] [address]    Special Requests   Final    BOTTLES DRAWN AEROBIC AND ANAEROBIC Blood Culture adequate volume Performed at [facility] [address] [place], [place] [address]    Culture   Final    NO GROWTH 2 DAYS Performed at [facility] [address], [place], [place] [address]    Report Status PENDING  Incomplete  Blood Culture (routine x 2)     Status: None (Preliminary result)   Collection Time: 07/20/22 12:33 AM   Specimen: BLOOD  Result Value Ref Range Status   Specimen Description   Final    BLOOD LEFT ANTECUBITAL Performed at [facility] [address], [place], [place] [address]    Special Requests   Final    BOTTLES DRAWN AEROBIC AND ANAEROBIC Blood Culture adequate volume Performed at [facility] [address], [place], [place] [address]    Culture   Final    NO GROWTH 2 DAYS Performed at [facility] [address] [place], [place] [address]    Report Status PENDING  Incomplete     Labs: Basic Metabolic Panel: Recent Labs  Lab 07/20/22 0033 07/21/22 0740 07/22/22 0550  NA 139 139  --   K 3.9 4.0  --   CL 104 104  --   CO2 26 26  --   GLUCOSE 97 109*  --   BUN 15 19  --   CREATININE 0.81 1.10 1.28*  CALCIUM 9.1 8.5*  --    Liver Function Tests: Recent Labs  Lab 07/20/22 0033  AST 13*  ALT 12  ALKPHOS 74  BILITOT 0.5  PROT 7.3  ALBUMIN 3.2*   CBC: Recent Labs  Lab 07/20/22 0033 07/21/22 0740  WBC 12.1*  5.2  NEUTROABS 8.4*  --   HGB 11.9* 12.4*  HCT 36.1* 38.0*  MCV 88.0 88.8  PLT 293 278   CBG: No results for input(s): "GLUCAP" in the last 168 hours. Hgb A1c Recent Labs    07/20/22 0033  HGBA1C 5.4   Lipid Profile No results for input(s): "CHOL", "HDL", "LDLCALC", "TRIG", "CHOLHDL", "LDLDIRECT" in the last 72 hours. Thyroid function studies No results for input(s): "TSH", "T4TOTAL", "T3FREE", "THYROIDAB" in the last 72 hours.  Invalid input(s): "FREET3" Urinalysis    Component Value Date/Time   COLORURINE YELLOW 07/20/2022 0012   APPEARANCEUR HAZY (A) 07/20/2022 0012   LABSPEC 1.006 07/20/2022 0012   PHURINE 7.0 07/20/2022 0012   GLUCOSEU NEGATIVE 07/20/2022 0012   HGBUR SMALL (A) 07/20/2022 0012   BILIRUBINUR NEGATIVE 07/20/2022 0012   KETONESUR NEGATIVE 07/20/2022 0012  PROTEINUR NEGATIVE 07/20/2022 0012   NITRITE NEGATIVE 07/20/2022 0012   LEUKOCYTESUR LARGE (A) 07/20/2022 0012    FURTHER DISCHARGE INSTRUCTIONS:   Get Medicines reviewed and adjusted: Please take all your medications with you for your next visit with your Primary MD   Laboratory/radiological data: Please request your Primary MD to go over all hospital tests and procedure/radiological results at the follow up, please ask your Primary MD to get all Hospital records sent to his/her office.   In some cases, they will be blood work, cultures and biopsy results pending at the time of your discharge. Please request that your primary care M.D. goes through all the records of your hospital data and follows up on these results.   Also Note the following: If you experience worsening of your admission symptoms, develop shortness of breath, life threatening emergency, suicidal or homicidal thoughts you must seek medical attention immediately by calling 911 or calling your MD immediately  if symptoms less severe.   You must read complete instructions/literature along with all the possible adverse  reactions/side effects for all the Medicines you take and that have been prescribed to you. Take any new Medicines after you have completely understood and accpet all the possible adverse reactions/side effects.    Do not drive when taking Pain medications or sleeping medications (Benzodaizepines)   Do not take more than prescribed Pain, Sleep and Anxiety Medications. It is not advisable to combine anxiety,sleep and pain medications without talking with your primary care practitioner   Special Instructions: If you have smoked or chewed Tobacco  in the last 2 yrs please stop smoking, stop any regular Alcohol  and or any Recreational drug use.   Wear Seat belts while driving.   Please note: You were cared for by a hospitalist during your hospital stay. Once you are discharged, your primary care physician will handle any further medical issues. Please note that NO REFILLS for any discharge medications will be authorized once you are discharged, as it is imperative that you return to your primary care physician (or establish a relationship with a primary care physician if you do not have one) for your post hospital discharge needs so that they can reassess your need for medications and monitor your lab values.  Time coordinating discharge: 35 minutes  SIGNED:  Marzetta Board, MD, PhD 07/22/2022, 8:20 AM

## 2022-07-22 NOTE — Progress Notes (Signed)
   Subjective:  No acute events overnight. Resting comfortably.  Feels well. Pain well controlled.   Objective:   VITALS:   Vitals:   07/21/22 0834 07/21/22 1651 07/21/22 2001 07/22/22 0413  BP: 136/78 137/69 121/68 127/69  Pulse: 73 77 67 68  Resp: '18 18 19 16  '$ Temp: (!) 97.5 F (36.4 C) 98 F (36.7 C) 97.9 F (36.6 C) 97.8 F (36.6 C)  TempSrc: Oral Oral    SpO2: 93% 95% 96% 95%  Weight:      Height:        Gen: NAD, resting comfortably Pulm: Normal WOB on RA CV: Normal rate RUE: Wound clean and dry, no drainage, granulation tissue in wound bed without fibrinous material or purulence, no palpable fluctuance, no pain w/ ROM of wrist.     Lab Results  Component Value Date   WBC 5.2 07/21/2022   HGB 12.4 (L) 07/21/2022   HCT 38.0 (L) 07/21/2022   MCV 88.8 07/21/2022   PLT 278 07/21/2022     Assessment/Plan:    61 yo M w/ BUE cellulitis.  CT in ER negative for discrete fluid collection.  R forearm wound much healthier in appearance with no purulent drainage and no palpable fluctuance.  Leukocytosis has resolved.  No surgical intervention Patient can be dc'd on PO antibiotics Discussed wound care with patient including warm, soapy water and clean, dry dressing changes    Sherilyn Cooter, MD 07/22/2022, 8:31 AM [phone]

## 2022-07-25 LAB — CULTURE, BLOOD (ROUTINE X 2)
Culture: NO GROWTH
Culture: NO GROWTH
Special Requests: ADEQUATE
Special Requests: ADEQUATE

## 2022-10-06 DEATH — deceased
